# Patient Record
Sex: Female | Born: 1945 | Race: White | Hispanic: No | Smoking: Never smoker
Health system: Southern US, Community
[De-identification: ages and names within clinical notes are randomized; demographics above are authoritative.]

## PROBLEM LIST (undated history)

## (undated) DIAGNOSIS — J8410 Pulmonary fibrosis, unspecified: Secondary | ICD-10-CM

## (undated) DIAGNOSIS — I10 Essential (primary) hypertension: Secondary | ICD-10-CM

## (undated) DIAGNOSIS — F0150 Vascular dementia without behavioral disturbance: Secondary | ICD-10-CM

## (undated) DIAGNOSIS — M810 Age-related osteoporosis without current pathological fracture: Secondary | ICD-10-CM

## (undated) DIAGNOSIS — K219 Gastro-esophageal reflux disease without esophagitis: Secondary | ICD-10-CM

## (undated) DIAGNOSIS — D649 Anemia, unspecified: Secondary | ICD-10-CM

## (undated) DIAGNOSIS — J449 Chronic obstructive pulmonary disease, unspecified: Secondary | ICD-10-CM

## (undated) DIAGNOSIS — G4089 Other seizures: Secondary | ICD-10-CM

## (undated) DIAGNOSIS — L89150 Pressure ulcer of sacral region, unstageable: Secondary | ICD-10-CM

## (undated) DIAGNOSIS — E7800 Pure hypercholesterolemia, unspecified: Secondary | ICD-10-CM

## (undated) DIAGNOSIS — E039 Hypothyroidism, unspecified: Secondary | ICD-10-CM

## (undated) DIAGNOSIS — I25119 Atherosclerotic heart disease of native coronary artery with unspecified angina pectoris: Secondary | ICD-10-CM

## (undated) DIAGNOSIS — Z978 Presence of other specified devices: Secondary | ICD-10-CM

## (undated) DIAGNOSIS — R748 Abnormal levels of other serum enzymes: Secondary | ICD-10-CM

## (undated) DIAGNOSIS — F418 Other specified anxiety disorders: Secondary | ICD-10-CM

## (undated) DIAGNOSIS — G4700 Insomnia, unspecified: Secondary | ICD-10-CM

## (undated) DIAGNOSIS — M86462 Chronic osteomyelitis with draining sinus, left tibia and fibula: Secondary | ICD-10-CM

## (undated) DIAGNOSIS — E46 Unspecified protein-calorie malnutrition: Secondary | ICD-10-CM

## (undated) DIAGNOSIS — F419 Anxiety disorder, unspecified: Secondary | ICD-10-CM

## (undated) DIAGNOSIS — J309 Allergic rhinitis, unspecified: Secondary | ICD-10-CM

## (undated) DIAGNOSIS — M069 Rheumatoid arthritis, unspecified: Secondary | ICD-10-CM

## (undated) DIAGNOSIS — N319 Neuromuscular dysfunction of bladder, unspecified: Secondary | ICD-10-CM

## (undated) DIAGNOSIS — R1310 Dysphagia, unspecified: Secondary | ICD-10-CM

## (undated) DIAGNOSIS — M818 Other osteoporosis without current pathological fracture: Secondary | ICD-10-CM

## (undated) HISTORY — DX: Other specified anxiety disorders: F41.8

## (undated) HISTORY — PX: ABDOMINAL HYSTERECTOMY: SHX81

## (undated) HISTORY — PX: INCISIONAL HERNIA REPAIR: SHX193

## (undated) HISTORY — DX: Anxiety disorder, unspecified: F41.9

## (undated) HISTORY — DX: Age-related osteoporosis without current pathological fracture: M81.0

## (undated) HISTORY — DX: Presence of other specified devices: Z97.8

## (undated) HISTORY — DX: Other osteoporosis without current pathological fracture: M81.8

## (undated) HISTORY — DX: Other seizures: G40.89

## (undated) HISTORY — DX: Hypothyroidism, unspecified: E03.9

## (undated) HISTORY — DX: Insomnia, unspecified: G47.00

## (undated) HISTORY — DX: Chronic osteomyelitis with draining sinus, left tibia and fibula: M86.462

## (undated) HISTORY — DX: Atherosclerotic heart disease of native coronary artery with unspecified angina pectoris: I25.119

## (undated) HISTORY — DX: Pulmonary fibrosis, unspecified: J84.10

## (undated) HISTORY — DX: Anemia, unspecified: D64.9

## (undated) HISTORY — PX: CARPAL TUNNEL RELEASE: SHX101

## (undated) HISTORY — DX: Essential (primary) hypertension: I10

## (undated) HISTORY — DX: Pressure ulcer of sacral region, unstageable: L89.150

## (undated) HISTORY — DX: Vascular dementia, unspecified severity, without behavioral disturbance, psychotic disturbance, mood disturbance, and anxiety: F01.50

## (undated) HISTORY — PX: BELOW KNEE LEG AMPUTATION: SUR23

## (undated) HISTORY — DX: Unspecified protein-calorie malnutrition: E46

## (undated) HISTORY — DX: Abnormal levels of other serum enzymes: R74.8

## (undated) HISTORY — DX: Neuromuscular dysfunction of bladder, unspecified: N31.9

## (undated) HISTORY — DX: Gastro-esophageal reflux disease without esophagitis: K21.9

## (undated) HISTORY — DX: Pure hypercholesterolemia, unspecified: E78.00

## (undated) HISTORY — PX: OVARY SURGERY: SHX727

## (undated) HISTORY — DX: Rheumatoid arthritis, unspecified: M06.9

## (undated) HISTORY — PX: LAPAROSCOPIC CHOLECYSTECTOMY: SUR755

## (undated) HISTORY — DX: Dysphagia, unspecified: R13.10

## (undated) HISTORY — DX: Allergic rhinitis, unspecified: J30.9

## (undated) HISTORY — DX: Chronic obstructive pulmonary disease, unspecified: J44.9

---

## 2001-11-22 ENCOUNTER — Inpatient Hospital Stay (HOSPITAL_COMMUNITY): Admission: EM | Admit: 2001-11-22 | Discharge: 2001-11-25 | Payer: Self-pay | Admitting: Emergency Medicine

## 2001-11-22 ENCOUNTER — Encounter: Payer: Self-pay | Admitting: Emergency Medicine

## 2001-11-23 ENCOUNTER — Encounter: Payer: Self-pay | Admitting: Internal Medicine

## 2004-05-29 ENCOUNTER — Ambulatory Visit: Admission: RE | Admit: 2004-05-29 | Discharge: 2004-05-29 | Payer: Self-pay | Admitting: Gynecology

## 2004-06-19 ENCOUNTER — Inpatient Hospital Stay (HOSPITAL_COMMUNITY): Admission: RE | Admit: 2004-06-19 | Discharge: 2004-06-23 | Payer: Self-pay | Admitting: Obstetrics & Gynecology

## 2004-07-31 ENCOUNTER — Ambulatory Visit: Admission: RE | Admit: 2004-07-31 | Discharge: 2004-07-31 | Payer: Self-pay | Admitting: Gynecology

## 2005-08-23 ENCOUNTER — Encounter: Admission: RE | Admit: 2005-08-23 | Discharge: 2005-08-23 | Payer: Self-pay | Admitting: Internal Medicine

## 2014-06-13 DIAGNOSIS — E780 Pure hypercholesterolemia: Secondary | ICD-10-CM | POA: Diagnosis not present

## 2014-06-13 DIAGNOSIS — I10 Essential (primary) hypertension: Secondary | ICD-10-CM | POA: Diagnosis not present

## 2014-06-13 DIAGNOSIS — F411 Generalized anxiety disorder: Secondary | ICD-10-CM | POA: Diagnosis not present

## 2014-06-13 DIAGNOSIS — M069 Rheumatoid arthritis, unspecified: Secondary | ICD-10-CM | POA: Diagnosis not present

## 2014-06-13 DIAGNOSIS — M159 Polyosteoarthritis, unspecified: Secondary | ICD-10-CM | POA: Diagnosis not present

## 2014-06-27 DIAGNOSIS — F410 Panic disorder [episodic paroxysmal anxiety] without agoraphobia: Secondary | ICD-10-CM | POA: Diagnosis not present

## 2014-06-27 DIAGNOSIS — Z9114 Patient's other noncompliance with medication regimen: Secondary | ICD-10-CM | POA: Diagnosis not present

## 2014-06-27 DIAGNOSIS — F329 Major depressive disorder, single episode, unspecified: Secondary | ICD-10-CM | POA: Diagnosis not present

## 2014-06-27 DIAGNOSIS — Z7982 Long term (current) use of aspirin: Secondary | ICD-10-CM | POA: Diagnosis not present

## 2014-06-29 DIAGNOSIS — I10 Essential (primary) hypertension: Secondary | ICD-10-CM | POA: Diagnosis not present

## 2014-06-29 DIAGNOSIS — M159 Polyosteoarthritis, unspecified: Secondary | ICD-10-CM | POA: Diagnosis not present

## 2014-06-29 DIAGNOSIS — E039 Hypothyroidism, unspecified: Secondary | ICD-10-CM | POA: Diagnosis not present

## 2014-06-29 DIAGNOSIS — G4700 Insomnia, unspecified: Secondary | ICD-10-CM | POA: Diagnosis not present

## 2014-06-29 DIAGNOSIS — E780 Pure hypercholesterolemia: Secondary | ICD-10-CM | POA: Diagnosis not present

## 2014-06-29 DIAGNOSIS — F411 Generalized anxiety disorder: Secondary | ICD-10-CM | POA: Diagnosis not present

## 2014-07-08 DIAGNOSIS — J449 Chronic obstructive pulmonary disease, unspecified: Secondary | ICD-10-CM | POA: Diagnosis not present

## 2014-07-11 DIAGNOSIS — E780 Pure hypercholesterolemia: Secondary | ICD-10-CM | POA: Diagnosis not present

## 2014-07-11 DIAGNOSIS — M159 Polyosteoarthritis, unspecified: Secondary | ICD-10-CM | POA: Diagnosis not present

## 2014-07-11 DIAGNOSIS — G4700 Insomnia, unspecified: Secondary | ICD-10-CM | POA: Diagnosis not present

## 2014-07-11 DIAGNOSIS — I10 Essential (primary) hypertension: Secondary | ICD-10-CM | POA: Diagnosis not present

## 2014-07-11 DIAGNOSIS — R110 Nausea: Secondary | ICD-10-CM | POA: Diagnosis not present

## 2014-07-29 DIAGNOSIS — Z79899 Other long term (current) drug therapy: Secondary | ICD-10-CM | POA: Diagnosis not present

## 2014-07-29 DIAGNOSIS — M0579 Rheumatoid arthritis with rheumatoid factor of multiple sites without organ or systems involvement: Secondary | ICD-10-CM | POA: Diagnosis not present

## 2014-07-29 DIAGNOSIS — M797 Fibromyalgia: Secondary | ICD-10-CM | POA: Diagnosis not present

## 2014-07-30 DIAGNOSIS — E039 Hypothyroidism, unspecified: Secondary | ICD-10-CM | POA: Diagnosis not present

## 2014-08-05 DIAGNOSIS — M159 Polyosteoarthritis, unspecified: Secondary | ICD-10-CM | POA: Diagnosis not present

## 2014-08-05 DIAGNOSIS — G4700 Insomnia, unspecified: Secondary | ICD-10-CM | POA: Diagnosis not present

## 2014-08-05 DIAGNOSIS — K219 Gastro-esophageal reflux disease without esophagitis: Secondary | ICD-10-CM | POA: Diagnosis not present

## 2014-08-05 DIAGNOSIS — I10 Essential (primary) hypertension: Secondary | ICD-10-CM | POA: Diagnosis not present

## 2014-08-05 DIAGNOSIS — E780 Pure hypercholesterolemia: Secondary | ICD-10-CM | POA: Diagnosis not present

## 2014-08-08 DIAGNOSIS — J449 Chronic obstructive pulmonary disease, unspecified: Secondary | ICD-10-CM | POA: Diagnosis not present

## 2014-08-28 DIAGNOSIS — E039 Hypothyroidism, unspecified: Secondary | ICD-10-CM | POA: Diagnosis not present

## 2014-09-05 DIAGNOSIS — E780 Pure hypercholesterolemia: Secondary | ICD-10-CM | POA: Diagnosis not present

## 2014-09-05 DIAGNOSIS — G4700 Insomnia, unspecified: Secondary | ICD-10-CM | POA: Diagnosis not present

## 2014-09-05 DIAGNOSIS — M159 Polyosteoarthritis, unspecified: Secondary | ICD-10-CM | POA: Diagnosis not present

## 2014-09-05 DIAGNOSIS — K219 Gastro-esophageal reflux disease without esophagitis: Secondary | ICD-10-CM | POA: Diagnosis not present

## 2014-09-05 DIAGNOSIS — I10 Essential (primary) hypertension: Secondary | ICD-10-CM | POA: Diagnosis not present

## 2014-09-06 DIAGNOSIS — J449 Chronic obstructive pulmonary disease, unspecified: Secondary | ICD-10-CM | POA: Diagnosis not present

## 2014-09-28 DIAGNOSIS — E039 Hypothyroidism, unspecified: Secondary | ICD-10-CM | POA: Diagnosis not present

## 2014-10-03 DIAGNOSIS — G4700 Insomnia, unspecified: Secondary | ICD-10-CM | POA: Diagnosis not present

## 2014-10-03 DIAGNOSIS — Z79899 Other long term (current) drug therapy: Secondary | ICD-10-CM | POA: Diagnosis not present

## 2014-10-03 DIAGNOSIS — Z9181 History of falling: Secondary | ICD-10-CM | POA: Diagnosis not present

## 2014-10-03 DIAGNOSIS — Z1389 Encounter for screening for other disorder: Secondary | ICD-10-CM | POA: Diagnosis not present

## 2014-10-03 DIAGNOSIS — I10 Essential (primary) hypertension: Secondary | ICD-10-CM | POA: Diagnosis not present

## 2014-10-03 DIAGNOSIS — M159 Polyosteoarthritis, unspecified: Secondary | ICD-10-CM | POA: Diagnosis not present

## 2014-10-03 DIAGNOSIS — E780 Pure hypercholesterolemia: Secondary | ICD-10-CM | POA: Diagnosis not present

## 2014-10-07 DIAGNOSIS — J449 Chronic obstructive pulmonary disease, unspecified: Secondary | ICD-10-CM | POA: Diagnosis not present

## 2014-10-26 DIAGNOSIS — K760 Fatty (change of) liver, not elsewhere classified: Secondary | ICD-10-CM | POA: Diagnosis not present

## 2014-10-28 DIAGNOSIS — E039 Hypothyroidism, unspecified: Secondary | ICD-10-CM | POA: Diagnosis not present

## 2014-10-31 DIAGNOSIS — K219 Gastro-esophageal reflux disease without esophagitis: Secondary | ICD-10-CM | POA: Diagnosis not present

## 2014-10-31 DIAGNOSIS — E780 Pure hypercholesterolemia: Secondary | ICD-10-CM | POA: Diagnosis not present

## 2014-10-31 DIAGNOSIS — R609 Edema, unspecified: Secondary | ICD-10-CM | POA: Diagnosis not present

## 2014-10-31 DIAGNOSIS — I10 Essential (primary) hypertension: Secondary | ICD-10-CM | POA: Diagnosis not present

## 2014-10-31 DIAGNOSIS — M159 Polyosteoarthritis, unspecified: Secondary | ICD-10-CM | POA: Diagnosis not present

## 2014-11-04 DIAGNOSIS — M069 Rheumatoid arthritis, unspecified: Secondary | ICD-10-CM | POA: Diagnosis not present

## 2014-11-04 DIAGNOSIS — Z79899 Other long term (current) drug therapy: Secondary | ICD-10-CM | POA: Diagnosis not present

## 2014-11-04 DIAGNOSIS — R748 Abnormal levels of other serum enzymes: Secondary | ICD-10-CM | POA: Diagnosis not present

## 2014-11-06 DIAGNOSIS — J449 Chronic obstructive pulmonary disease, unspecified: Secondary | ICD-10-CM | POA: Diagnosis not present

## 2014-11-28 DIAGNOSIS — R609 Edema, unspecified: Secondary | ICD-10-CM | POA: Diagnosis not present

## 2014-11-28 DIAGNOSIS — E780 Pure hypercholesterolemia: Secondary | ICD-10-CM | POA: Diagnosis not present

## 2014-11-28 DIAGNOSIS — E039 Hypothyroidism, unspecified: Secondary | ICD-10-CM | POA: Diagnosis not present

## 2014-11-28 DIAGNOSIS — K219 Gastro-esophageal reflux disease without esophagitis: Secondary | ICD-10-CM | POA: Diagnosis not present

## 2014-11-28 DIAGNOSIS — M159 Polyosteoarthritis, unspecified: Secondary | ICD-10-CM | POA: Diagnosis not present

## 2014-11-28 DIAGNOSIS — I10 Essential (primary) hypertension: Secondary | ICD-10-CM | POA: Diagnosis not present

## 2014-12-07 DIAGNOSIS — J449 Chronic obstructive pulmonary disease, unspecified: Secondary | ICD-10-CM | POA: Diagnosis not present

## 2014-12-26 DIAGNOSIS — I2510 Atherosclerotic heart disease of native coronary artery without angina pectoris: Secondary | ICD-10-CM | POA: Diagnosis not present

## 2014-12-26 DIAGNOSIS — R42 Dizziness and giddiness: Secondary | ICD-10-CM | POA: Diagnosis not present

## 2014-12-26 DIAGNOSIS — M159 Polyosteoarthritis, unspecified: Secondary | ICD-10-CM | POA: Diagnosis not present

## 2014-12-26 DIAGNOSIS — E780 Pure hypercholesterolemia: Secondary | ICD-10-CM | POA: Diagnosis not present

## 2014-12-26 DIAGNOSIS — I10 Essential (primary) hypertension: Secondary | ICD-10-CM | POA: Diagnosis not present

## 2014-12-28 DIAGNOSIS — E039 Hypothyroidism, unspecified: Secondary | ICD-10-CM | POA: Diagnosis not present

## 2015-01-06 DIAGNOSIS — J449 Chronic obstructive pulmonary disease, unspecified: Secondary | ICD-10-CM | POA: Diagnosis not present

## 2015-01-09 DIAGNOSIS — Z89522 Acquired absence of left knee: Secondary | ICD-10-CM | POA: Diagnosis not present

## 2015-01-09 DIAGNOSIS — I2510 Atherosclerotic heart disease of native coronary artery without angina pectoris: Secondary | ICD-10-CM | POA: Diagnosis not present

## 2015-01-09 DIAGNOSIS — I10 Essential (primary) hypertension: Secondary | ICD-10-CM | POA: Diagnosis not present

## 2015-01-09 DIAGNOSIS — E780 Pure hypercholesterolemia: Secondary | ICD-10-CM | POA: Diagnosis not present

## 2015-01-09 DIAGNOSIS — M159 Polyosteoarthritis, unspecified: Secondary | ICD-10-CM | POA: Diagnosis not present

## 2015-01-10 DIAGNOSIS — J209 Acute bronchitis, unspecified: Secondary | ICD-10-CM | POA: Diagnosis not present

## 2015-01-10 DIAGNOSIS — I10 Essential (primary) hypertension: Secondary | ICD-10-CM | POA: Diagnosis not present

## 2015-01-10 DIAGNOSIS — E780 Pure hypercholesterolemia: Secondary | ICD-10-CM | POA: Diagnosis not present

## 2015-01-10 DIAGNOSIS — M159 Polyosteoarthritis, unspecified: Secondary | ICD-10-CM | POA: Diagnosis not present

## 2015-01-10 DIAGNOSIS — I2510 Atherosclerotic heart disease of native coronary artery without angina pectoris: Secondary | ICD-10-CM | POA: Diagnosis not present

## 2015-01-20 DIAGNOSIS — R51 Headache: Secondary | ICD-10-CM | POA: Diagnosis not present

## 2015-01-20 DIAGNOSIS — R42 Dizziness and giddiness: Secondary | ICD-10-CM | POA: Diagnosis not present

## 2015-01-23 DIAGNOSIS — E780 Pure hypercholesterolemia: Secondary | ICD-10-CM | POA: Diagnosis not present

## 2015-01-23 DIAGNOSIS — I10 Essential (primary) hypertension: Secondary | ICD-10-CM | POA: Diagnosis not present

## 2015-01-23 DIAGNOSIS — I2510 Atherosclerotic heart disease of native coronary artery without angina pectoris: Secondary | ICD-10-CM | POA: Diagnosis not present

## 2015-01-23 DIAGNOSIS — M159 Polyosteoarthritis, unspecified: Secondary | ICD-10-CM | POA: Diagnosis not present

## 2015-01-23 DIAGNOSIS — K219 Gastro-esophageal reflux disease without esophagitis: Secondary | ICD-10-CM | POA: Diagnosis not present

## 2015-01-28 DIAGNOSIS — E039 Hypothyroidism, unspecified: Secondary | ICD-10-CM | POA: Diagnosis not present

## 2015-01-28 DIAGNOSIS — R51 Headache: Secondary | ICD-10-CM | POA: Diagnosis not present

## 2015-01-28 DIAGNOSIS — R42 Dizziness and giddiness: Secondary | ICD-10-CM | POA: Diagnosis not present

## 2015-02-06 DIAGNOSIS — R51 Headache: Secondary | ICD-10-CM | POA: Diagnosis not present

## 2015-02-06 DIAGNOSIS — J449 Chronic obstructive pulmonary disease, unspecified: Secondary | ICD-10-CM | POA: Diagnosis not present

## 2015-02-06 DIAGNOSIS — R42 Dizziness and giddiness: Secondary | ICD-10-CM | POA: Diagnosis not present

## 2015-02-06 DIAGNOSIS — I639 Cerebral infarction, unspecified: Secondary | ICD-10-CM | POA: Diagnosis not present

## 2015-02-20 DIAGNOSIS — M159 Polyosteoarthritis, unspecified: Secondary | ICD-10-CM | POA: Diagnosis not present

## 2015-02-20 DIAGNOSIS — I2510 Atherosclerotic heart disease of native coronary artery without angina pectoris: Secondary | ICD-10-CM | POA: Diagnosis not present

## 2015-02-20 DIAGNOSIS — M069 Rheumatoid arthritis, unspecified: Secondary | ICD-10-CM | POA: Diagnosis not present

## 2015-02-20 DIAGNOSIS — Z89522 Acquired absence of left knee: Secondary | ICD-10-CM | POA: Diagnosis not present

## 2015-02-20 DIAGNOSIS — J449 Chronic obstructive pulmonary disease, unspecified: Secondary | ICD-10-CM | POA: Diagnosis not present

## 2015-02-23 DIAGNOSIS — R1310 Dysphagia, unspecified: Secondary | ICD-10-CM | POA: Diagnosis not present

## 2015-02-23 DIAGNOSIS — M069 Rheumatoid arthritis, unspecified: Secondary | ICD-10-CM | POA: Diagnosis not present

## 2015-02-23 DIAGNOSIS — I2510 Atherosclerotic heart disease of native coronary artery without angina pectoris: Secondary | ICD-10-CM | POA: Diagnosis not present

## 2015-02-23 DIAGNOSIS — J449 Chronic obstructive pulmonary disease, unspecified: Secondary | ICD-10-CM | POA: Diagnosis not present

## 2015-02-23 DIAGNOSIS — M159 Polyosteoarthritis, unspecified: Secondary | ICD-10-CM | POA: Diagnosis not present

## 2015-02-28 DIAGNOSIS — E039 Hypothyroidism, unspecified: Secondary | ICD-10-CM | POA: Diagnosis not present

## 2015-03-09 DIAGNOSIS — J449 Chronic obstructive pulmonary disease, unspecified: Secondary | ICD-10-CM | POA: Diagnosis not present

## 2015-03-21 DIAGNOSIS — I2510 Atherosclerotic heart disease of native coronary artery without angina pectoris: Secondary | ICD-10-CM | POA: Diagnosis not present

## 2015-03-21 DIAGNOSIS — M069 Rheumatoid arthritis, unspecified: Secondary | ICD-10-CM | POA: Diagnosis not present

## 2015-03-21 DIAGNOSIS — M159 Polyosteoarthritis, unspecified: Secondary | ICD-10-CM | POA: Diagnosis not present

## 2015-03-21 DIAGNOSIS — Z23 Encounter for immunization: Secondary | ICD-10-CM | POA: Diagnosis not present

## 2015-03-21 DIAGNOSIS — J449 Chronic obstructive pulmonary disease, unspecified: Secondary | ICD-10-CM | POA: Diagnosis not present

## 2015-03-30 DIAGNOSIS — E039 Hypothyroidism, unspecified: Secondary | ICD-10-CM | POA: Diagnosis not present

## 2015-04-04 DIAGNOSIS — M159 Polyosteoarthritis, unspecified: Secondary | ICD-10-CM | POA: Diagnosis not present

## 2015-04-04 DIAGNOSIS — Z89512 Acquired absence of left leg below knee: Secondary | ICD-10-CM | POA: Diagnosis not present

## 2015-04-04 DIAGNOSIS — J449 Chronic obstructive pulmonary disease, unspecified: Secondary | ICD-10-CM | POA: Diagnosis not present

## 2015-04-04 DIAGNOSIS — I2510 Atherosclerotic heart disease of native coronary artery without angina pectoris: Secondary | ICD-10-CM | POA: Diagnosis not present

## 2015-04-08 DIAGNOSIS — J449 Chronic obstructive pulmonary disease, unspecified: Secondary | ICD-10-CM | POA: Diagnosis not present

## 2015-04-18 DIAGNOSIS — J449 Chronic obstructive pulmonary disease, unspecified: Secondary | ICD-10-CM | POA: Diagnosis not present

## 2015-04-18 DIAGNOSIS — M159 Polyosteoarthritis, unspecified: Secondary | ICD-10-CM | POA: Diagnosis not present

## 2015-04-18 DIAGNOSIS — R1310 Dysphagia, unspecified: Secondary | ICD-10-CM | POA: Diagnosis not present

## 2015-04-18 DIAGNOSIS — M069 Rheumatoid arthritis, unspecified: Secondary | ICD-10-CM | POA: Diagnosis not present

## 2015-04-18 DIAGNOSIS — I2510 Atherosclerotic heart disease of native coronary artery without angina pectoris: Secondary | ICD-10-CM | POA: Diagnosis not present

## 2015-04-30 DIAGNOSIS — E039 Hypothyroidism, unspecified: Secondary | ICD-10-CM | POA: Diagnosis not present

## 2015-05-05 DIAGNOSIS — I2510 Atherosclerotic heart disease of native coronary artery without angina pectoris: Secondary | ICD-10-CM | POA: Diagnosis not present

## 2015-05-05 DIAGNOSIS — J449 Chronic obstructive pulmonary disease, unspecified: Secondary | ICD-10-CM | POA: Diagnosis not present

## 2015-05-05 DIAGNOSIS — M159 Polyosteoarthritis, unspecified: Secondary | ICD-10-CM | POA: Diagnosis not present

## 2015-05-05 DIAGNOSIS — Z89512 Acquired absence of left leg below knee: Secondary | ICD-10-CM | POA: Diagnosis not present

## 2015-05-09 DIAGNOSIS — J449 Chronic obstructive pulmonary disease, unspecified: Secondary | ICD-10-CM | POA: Diagnosis not present

## 2015-05-22 DIAGNOSIS — R1310 Dysphagia, unspecified: Secondary | ICD-10-CM | POA: Diagnosis not present

## 2015-05-22 DIAGNOSIS — I10 Essential (primary) hypertension: Secondary | ICD-10-CM | POA: Diagnosis not present

## 2015-05-22 DIAGNOSIS — M069 Rheumatoid arthritis, unspecified: Secondary | ICD-10-CM | POA: Diagnosis not present

## 2015-05-22 DIAGNOSIS — E7800 Pure hypercholesterolemia, unspecified: Secondary | ICD-10-CM | POA: Diagnosis not present

## 2015-05-22 DIAGNOSIS — I2510 Atherosclerotic heart disease of native coronary artery without angina pectoris: Secondary | ICD-10-CM | POA: Diagnosis not present

## 2015-05-22 DIAGNOSIS — J449 Chronic obstructive pulmonary disease, unspecified: Secondary | ICD-10-CM | POA: Diagnosis not present

## 2015-05-22 DIAGNOSIS — D649 Anemia, unspecified: Secondary | ICD-10-CM | POA: Diagnosis not present

## 2015-05-30 DIAGNOSIS — E039 Hypothyroidism, unspecified: Secondary | ICD-10-CM | POA: Diagnosis not present

## 2015-06-04 DIAGNOSIS — Z89512 Acquired absence of left leg below knee: Secondary | ICD-10-CM | POA: Diagnosis not present

## 2015-06-04 DIAGNOSIS — J449 Chronic obstructive pulmonary disease, unspecified: Secondary | ICD-10-CM | POA: Diagnosis not present

## 2015-06-04 DIAGNOSIS — M159 Polyosteoarthritis, unspecified: Secondary | ICD-10-CM | POA: Diagnosis not present

## 2015-06-04 DIAGNOSIS — I2510 Atherosclerotic heart disease of native coronary artery without angina pectoris: Secondary | ICD-10-CM | POA: Diagnosis not present

## 2015-06-08 DIAGNOSIS — J449 Chronic obstructive pulmonary disease, unspecified: Secondary | ICD-10-CM | POA: Diagnosis not present

## 2015-06-23 DIAGNOSIS — M069 Rheumatoid arthritis, unspecified: Secondary | ICD-10-CM | POA: Diagnosis not present

## 2015-06-23 DIAGNOSIS — Z1389 Encounter for screening for other disorder: Secondary | ICD-10-CM | POA: Diagnosis not present

## 2015-06-23 DIAGNOSIS — I2510 Atherosclerotic heart disease of native coronary artery without angina pectoris: Secondary | ICD-10-CM | POA: Diagnosis not present

## 2015-06-23 DIAGNOSIS — J209 Acute bronchitis, unspecified: Secondary | ICD-10-CM | POA: Diagnosis not present

## 2015-06-23 DIAGNOSIS — J449 Chronic obstructive pulmonary disease, unspecified: Secondary | ICD-10-CM | POA: Diagnosis not present

## 2015-06-30 DIAGNOSIS — E039 Hypothyroidism, unspecified: Secondary | ICD-10-CM | POA: Diagnosis not present

## 2015-07-04 DIAGNOSIS — R1310 Dysphagia, unspecified: Secondary | ICD-10-CM | POA: Diagnosis not present

## 2015-07-04 DIAGNOSIS — I2510 Atherosclerotic heart disease of native coronary artery without angina pectoris: Secondary | ICD-10-CM | POA: Diagnosis not present

## 2015-07-04 DIAGNOSIS — J101 Influenza due to other identified influenza virus with other respiratory manifestations: Secondary | ICD-10-CM | POA: Diagnosis not present

## 2015-07-04 DIAGNOSIS — M069 Rheumatoid arthritis, unspecified: Secondary | ICD-10-CM | POA: Diagnosis not present

## 2015-07-04 DIAGNOSIS — J449 Chronic obstructive pulmonary disease, unspecified: Secondary | ICD-10-CM | POA: Diagnosis not present

## 2015-07-05 DIAGNOSIS — J449 Chronic obstructive pulmonary disease, unspecified: Secondary | ICD-10-CM | POA: Diagnosis not present

## 2015-07-05 DIAGNOSIS — I2510 Atherosclerotic heart disease of native coronary artery without angina pectoris: Secondary | ICD-10-CM | POA: Diagnosis not present

## 2015-07-05 DIAGNOSIS — M159 Polyosteoarthritis, unspecified: Secondary | ICD-10-CM | POA: Diagnosis not present

## 2015-07-05 DIAGNOSIS — Z89512 Acquired absence of left leg below knee: Secondary | ICD-10-CM | POA: Diagnosis not present

## 2015-07-09 DIAGNOSIS — J449 Chronic obstructive pulmonary disease, unspecified: Secondary | ICD-10-CM | POA: Diagnosis not present

## 2015-07-17 DIAGNOSIS — E039 Hypothyroidism, unspecified: Secondary | ICD-10-CM | POA: Diagnosis not present

## 2015-07-17 DIAGNOSIS — Z89522 Acquired absence of left knee: Secondary | ICD-10-CM | POA: Diagnosis not present

## 2015-07-17 DIAGNOSIS — J309 Allergic rhinitis, unspecified: Secondary | ICD-10-CM | POA: Diagnosis not present

## 2015-07-17 DIAGNOSIS — M159 Polyosteoarthritis, unspecified: Secondary | ICD-10-CM | POA: Diagnosis not present

## 2015-07-31 DIAGNOSIS — E039 Hypothyroidism, unspecified: Secondary | ICD-10-CM | POA: Diagnosis not present

## 2015-08-02 DIAGNOSIS — R0602 Shortness of breath: Secondary | ICD-10-CM | POA: Diagnosis not present

## 2015-08-02 DIAGNOSIS — J441 Chronic obstructive pulmonary disease with (acute) exacerbation: Secondary | ICD-10-CM | POA: Diagnosis not present

## 2015-08-02 DIAGNOSIS — J209 Acute bronchitis, unspecified: Secondary | ICD-10-CM | POA: Diagnosis not present

## 2015-08-02 DIAGNOSIS — R05 Cough: Secondary | ICD-10-CM | POA: Diagnosis not present

## 2015-08-02 DIAGNOSIS — J440 Chronic obstructive pulmonary disease with acute lower respiratory infection: Secondary | ICD-10-CM | POA: Diagnosis not present

## 2015-08-05 DIAGNOSIS — J449 Chronic obstructive pulmonary disease, unspecified: Secondary | ICD-10-CM | POA: Diagnosis not present

## 2015-08-05 DIAGNOSIS — Z89512 Acquired absence of left leg below knee: Secondary | ICD-10-CM | POA: Diagnosis not present

## 2015-08-05 DIAGNOSIS — I2510 Atherosclerotic heart disease of native coronary artery without angina pectoris: Secondary | ICD-10-CM | POA: Diagnosis not present

## 2015-08-05 DIAGNOSIS — M159 Polyosteoarthritis, unspecified: Secondary | ICD-10-CM | POA: Diagnosis not present

## 2015-08-08 DIAGNOSIS — J449 Chronic obstructive pulmonary disease, unspecified: Secondary | ICD-10-CM | POA: Diagnosis not present

## 2015-08-09 DIAGNOSIS — J449 Chronic obstructive pulmonary disease, unspecified: Secondary | ICD-10-CM | POA: Diagnosis not present

## 2015-08-14 DIAGNOSIS — J309 Allergic rhinitis, unspecified: Secondary | ICD-10-CM | POA: Diagnosis not present

## 2015-08-14 DIAGNOSIS — J209 Acute bronchitis, unspecified: Secondary | ICD-10-CM | POA: Diagnosis not present

## 2015-08-14 DIAGNOSIS — M159 Polyosteoarthritis, unspecified: Secondary | ICD-10-CM | POA: Diagnosis not present

## 2015-08-14 DIAGNOSIS — E7800 Pure hypercholesterolemia, unspecified: Secondary | ICD-10-CM | POA: Diagnosis not present

## 2015-08-14 DIAGNOSIS — I10 Essential (primary) hypertension: Secondary | ICD-10-CM | POA: Diagnosis not present

## 2015-08-26 DIAGNOSIS — B370 Candidal stomatitis: Secondary | ICD-10-CM | POA: Diagnosis not present

## 2015-08-26 DIAGNOSIS — J449 Chronic obstructive pulmonary disease, unspecified: Secondary | ICD-10-CM | POA: Diagnosis not present

## 2015-08-28 DIAGNOSIS — E039 Hypothyroidism, unspecified: Secondary | ICD-10-CM | POA: Diagnosis not present

## 2015-09-02 DIAGNOSIS — I2510 Atherosclerotic heart disease of native coronary artery without angina pectoris: Secondary | ICD-10-CM | POA: Diagnosis not present

## 2015-09-02 DIAGNOSIS — J449 Chronic obstructive pulmonary disease, unspecified: Secondary | ICD-10-CM | POA: Diagnosis not present

## 2015-09-02 DIAGNOSIS — M159 Polyosteoarthritis, unspecified: Secondary | ICD-10-CM | POA: Diagnosis not present

## 2015-09-02 DIAGNOSIS — Z89512 Acquired absence of left leg below knee: Secondary | ICD-10-CM | POA: Diagnosis not present

## 2015-09-06 DIAGNOSIS — J449 Chronic obstructive pulmonary disease, unspecified: Secondary | ICD-10-CM | POA: Diagnosis not present

## 2015-09-11 DIAGNOSIS — M159 Polyosteoarthritis, unspecified: Secondary | ICD-10-CM | POA: Diagnosis not present

## 2015-09-11 DIAGNOSIS — E039 Hypothyroidism, unspecified: Secondary | ICD-10-CM | POA: Diagnosis not present

## 2015-09-11 DIAGNOSIS — J309 Allergic rhinitis, unspecified: Secondary | ICD-10-CM | POA: Diagnosis not present

## 2015-09-11 DIAGNOSIS — Z89522 Acquired absence of left knee: Secondary | ICD-10-CM | POA: Diagnosis not present

## 2015-09-11 DIAGNOSIS — E7800 Pure hypercholesterolemia, unspecified: Secondary | ICD-10-CM | POA: Diagnosis not present

## 2015-09-11 DIAGNOSIS — I10 Essential (primary) hypertension: Secondary | ICD-10-CM | POA: Diagnosis not present

## 2015-09-28 DIAGNOSIS — E039 Hypothyroidism, unspecified: Secondary | ICD-10-CM | POA: Diagnosis not present

## 2015-10-03 DIAGNOSIS — Z89512 Acquired absence of left leg below knee: Secondary | ICD-10-CM | POA: Diagnosis not present

## 2015-10-03 DIAGNOSIS — M159 Polyosteoarthritis, unspecified: Secondary | ICD-10-CM | POA: Diagnosis not present

## 2015-10-03 DIAGNOSIS — I2510 Atherosclerotic heart disease of native coronary artery without angina pectoris: Secondary | ICD-10-CM | POA: Diagnosis not present

## 2015-10-03 DIAGNOSIS — J449 Chronic obstructive pulmonary disease, unspecified: Secondary | ICD-10-CM | POA: Diagnosis not present

## 2015-10-07 DIAGNOSIS — J449 Chronic obstructive pulmonary disease, unspecified: Secondary | ICD-10-CM | POA: Diagnosis not present

## 2015-10-16 DIAGNOSIS — J309 Allergic rhinitis, unspecified: Secondary | ICD-10-CM | POA: Diagnosis not present

## 2015-10-16 DIAGNOSIS — M159 Polyosteoarthritis, unspecified: Secondary | ICD-10-CM | POA: Diagnosis not present

## 2015-10-16 DIAGNOSIS — E039 Hypothyroidism, unspecified: Secondary | ICD-10-CM | POA: Diagnosis not present

## 2015-10-16 DIAGNOSIS — Z89522 Acquired absence of left knee: Secondary | ICD-10-CM | POA: Diagnosis not present

## 2015-10-28 DIAGNOSIS — E039 Hypothyroidism, unspecified: Secondary | ICD-10-CM | POA: Diagnosis not present

## 2015-10-31 DIAGNOSIS — K210 Gastro-esophageal reflux disease with esophagitis: Secondary | ICD-10-CM | POA: Diagnosis not present

## 2015-10-31 DIAGNOSIS — R1314 Dysphagia, pharyngoesophageal phase: Secondary | ICD-10-CM | POA: Diagnosis not present

## 2015-11-02 DIAGNOSIS — I2510 Atherosclerotic heart disease of native coronary artery without angina pectoris: Secondary | ICD-10-CM | POA: Diagnosis not present

## 2015-11-02 DIAGNOSIS — Z89512 Acquired absence of left leg below knee: Secondary | ICD-10-CM | POA: Diagnosis not present

## 2015-11-02 DIAGNOSIS — M159 Polyosteoarthritis, unspecified: Secondary | ICD-10-CM | POA: Diagnosis not present

## 2015-11-02 DIAGNOSIS — J449 Chronic obstructive pulmonary disease, unspecified: Secondary | ICD-10-CM | POA: Diagnosis not present

## 2015-11-06 DIAGNOSIS — K210 Gastro-esophageal reflux disease with esophagitis: Secondary | ICD-10-CM | POA: Diagnosis not present

## 2015-11-06 DIAGNOSIS — R1314 Dysphagia, pharyngoesophageal phase: Secondary | ICD-10-CM | POA: Diagnosis not present

## 2015-11-06 DIAGNOSIS — M069 Rheumatoid arthritis, unspecified: Secondary | ICD-10-CM | POA: Diagnosis not present

## 2015-11-06 DIAGNOSIS — Z8673 Personal history of transient ischemic attack (TIA), and cerebral infarction without residual deficits: Secondary | ICD-10-CM | POA: Diagnosis not present

## 2015-11-06 DIAGNOSIS — Z87891 Personal history of nicotine dependence: Secondary | ICD-10-CM | POA: Diagnosis not present

## 2015-11-06 DIAGNOSIS — K225 Diverticulum of esophagus, acquired: Secondary | ICD-10-CM | POA: Diagnosis not present

## 2015-11-06 DIAGNOSIS — J449 Chronic obstructive pulmonary disease, unspecified: Secondary | ICD-10-CM | POA: Diagnosis not present

## 2015-11-06 DIAGNOSIS — I2510 Atherosclerotic heart disease of native coronary artery without angina pectoris: Secondary | ICD-10-CM | POA: Diagnosis not present

## 2015-11-06 DIAGNOSIS — Z9049 Acquired absence of other specified parts of digestive tract: Secondary | ICD-10-CM | POA: Diagnosis not present

## 2015-11-06 DIAGNOSIS — M810 Age-related osteoporosis without current pathological fracture: Secondary | ICD-10-CM | POA: Diagnosis not present

## 2015-11-06 DIAGNOSIS — E039 Hypothyroidism, unspecified: Secondary | ICD-10-CM | POA: Diagnosis not present

## 2015-11-06 DIAGNOSIS — I252 Old myocardial infarction: Secondary | ICD-10-CM | POA: Diagnosis not present

## 2015-11-06 DIAGNOSIS — K228 Other specified diseases of esophagus: Secondary | ICD-10-CM | POA: Diagnosis not present

## 2015-11-06 DIAGNOSIS — E7800 Pure hypercholesterolemia, unspecified: Secondary | ICD-10-CM | POA: Diagnosis not present

## 2015-11-06 DIAGNOSIS — Q393 Congenital stenosis and stricture of esophagus: Secondary | ICD-10-CM | POA: Diagnosis not present

## 2015-11-06 DIAGNOSIS — Z79899 Other long term (current) drug therapy: Secondary | ICD-10-CM | POA: Diagnosis not present

## 2015-11-06 DIAGNOSIS — K2970 Gastritis, unspecified, without bleeding: Secondary | ICD-10-CM | POA: Diagnosis not present

## 2015-11-06 DIAGNOSIS — K449 Diaphragmatic hernia without obstruction or gangrene: Secondary | ICD-10-CM | POA: Diagnosis not present

## 2015-11-06 DIAGNOSIS — Z955 Presence of coronary angioplasty implant and graft: Secondary | ICD-10-CM | POA: Diagnosis not present

## 2015-11-06 DIAGNOSIS — K222 Esophageal obstruction: Secondary | ICD-10-CM | POA: Diagnosis not present

## 2015-11-06 DIAGNOSIS — Z9981 Dependence on supplemental oxygen: Secondary | ICD-10-CM | POA: Diagnosis not present

## 2015-11-06 DIAGNOSIS — I10 Essential (primary) hypertension: Secondary | ICD-10-CM | POA: Diagnosis not present

## 2015-11-06 DIAGNOSIS — K219 Gastro-esophageal reflux disease without esophagitis: Secondary | ICD-10-CM | POA: Diagnosis not present

## 2015-11-08 DIAGNOSIS — I252 Old myocardial infarction: Secondary | ICD-10-CM | POA: Diagnosis not present

## 2015-11-08 DIAGNOSIS — I10 Essential (primary) hypertension: Secondary | ICD-10-CM | POA: Diagnosis not present

## 2015-11-08 DIAGNOSIS — R0602 Shortness of breath: Secondary | ICD-10-CM | POA: Diagnosis not present

## 2015-11-08 DIAGNOSIS — M069 Rheumatoid arthritis, unspecified: Secondary | ICD-10-CM | POA: Diagnosis not present

## 2015-11-08 DIAGNOSIS — Z955 Presence of coronary angioplasty implant and graft: Secondary | ICD-10-CM | POA: Diagnosis not present

## 2015-11-08 DIAGNOSIS — E860 Dehydration: Secondary | ICD-10-CM | POA: Diagnosis not present

## 2015-11-08 DIAGNOSIS — I249 Acute ischemic heart disease, unspecified: Secondary | ICD-10-CM | POA: Diagnosis not present

## 2015-11-08 DIAGNOSIS — E039 Hypothyroidism, unspecified: Secondary | ICD-10-CM | POA: Diagnosis not present

## 2015-11-08 DIAGNOSIS — K219 Gastro-esophageal reflux disease without esophagitis: Secondary | ICD-10-CM | POA: Diagnosis not present

## 2015-11-08 DIAGNOSIS — Z7982 Long term (current) use of aspirin: Secondary | ICD-10-CM | POA: Diagnosis not present

## 2015-11-08 DIAGNOSIS — Z79899 Other long term (current) drug therapy: Secondary | ICD-10-CM | POA: Diagnosis not present

## 2015-11-08 DIAGNOSIS — Z91041 Radiographic dye allergy status: Secondary | ICD-10-CM | POA: Diagnosis not present

## 2015-11-08 DIAGNOSIS — Z7952 Long term (current) use of systemic steroids: Secondary | ICD-10-CM | POA: Diagnosis not present

## 2015-11-08 DIAGNOSIS — J449 Chronic obstructive pulmonary disease, unspecified: Secondary | ICD-10-CM | POA: Diagnosis not present

## 2015-11-08 DIAGNOSIS — D696 Thrombocytopenia, unspecified: Secondary | ICD-10-CM | POA: Diagnosis not present

## 2015-11-08 DIAGNOSIS — I2510 Atherosclerotic heart disease of native coronary artery without angina pectoris: Secondary | ICD-10-CM | POA: Diagnosis not present

## 2015-11-08 DIAGNOSIS — Z9981 Dependence on supplemental oxygen: Secondary | ICD-10-CM | POA: Diagnosis not present

## 2015-11-08 DIAGNOSIS — K222 Esophageal obstruction: Secondary | ICD-10-CM | POA: Diagnosis not present

## 2015-11-08 DIAGNOSIS — I200 Unstable angina: Secondary | ICD-10-CM | POA: Diagnosis not present

## 2015-11-08 DIAGNOSIS — R079 Chest pain, unspecified: Secondary | ICD-10-CM | POA: Diagnosis not present

## 2015-11-09 DIAGNOSIS — Z955 Presence of coronary angioplasty implant and graft: Secondary | ICD-10-CM | POA: Diagnosis not present

## 2015-11-09 DIAGNOSIS — I252 Old myocardial infarction: Secondary | ICD-10-CM | POA: Diagnosis not present

## 2015-11-09 DIAGNOSIS — I2510 Atherosclerotic heart disease of native coronary artery without angina pectoris: Secondary | ICD-10-CM | POA: Diagnosis not present

## 2015-11-09 DIAGNOSIS — K222 Esophageal obstruction: Secondary | ICD-10-CM | POA: Diagnosis not present

## 2015-11-09 DIAGNOSIS — I25118 Atherosclerotic heart disease of native coronary artery with other forms of angina pectoris: Secondary | ICD-10-CM | POA: Diagnosis not present

## 2015-11-09 DIAGNOSIS — I10 Essential (primary) hypertension: Secondary | ICD-10-CM | POA: Diagnosis not present

## 2015-11-09 DIAGNOSIS — I119 Hypertensive heart disease without heart failure: Secondary | ICD-10-CM | POA: Diagnosis not present

## 2015-11-09 DIAGNOSIS — Z7952 Long term (current) use of systemic steroids: Secondary | ICD-10-CM | POA: Diagnosis not present

## 2015-11-09 DIAGNOSIS — Z79899 Other long term (current) drug therapy: Secondary | ICD-10-CM | POA: Diagnosis not present

## 2015-11-09 DIAGNOSIS — J449 Chronic obstructive pulmonary disease, unspecified: Secondary | ICD-10-CM | POA: Diagnosis not present

## 2015-11-09 DIAGNOSIS — Z7982 Long term (current) use of aspirin: Secondary | ICD-10-CM | POA: Diagnosis not present

## 2015-11-27 DIAGNOSIS — K219 Gastro-esophageal reflux disease without esophagitis: Secondary | ICD-10-CM | POA: Diagnosis not present

## 2015-11-27 DIAGNOSIS — Z89512 Acquired absence of left leg below knee: Secondary | ICD-10-CM | POA: Diagnosis not present

## 2015-11-27 DIAGNOSIS — I10 Essential (primary) hypertension: Secondary | ICD-10-CM | POA: Diagnosis not present

## 2015-11-27 DIAGNOSIS — M159 Polyosteoarthritis, unspecified: Secondary | ICD-10-CM | POA: Diagnosis not present

## 2015-11-27 DIAGNOSIS — E7800 Pure hypercholesterolemia, unspecified: Secondary | ICD-10-CM | POA: Diagnosis not present

## 2015-11-28 DIAGNOSIS — E039 Hypothyroidism, unspecified: Secondary | ICD-10-CM | POA: Diagnosis not present

## 2015-11-29 DIAGNOSIS — J449 Chronic obstructive pulmonary disease, unspecified: Secondary | ICD-10-CM | POA: Diagnosis not present

## 2015-12-03 DIAGNOSIS — Z89512 Acquired absence of left leg below knee: Secondary | ICD-10-CM | POA: Diagnosis not present

## 2015-12-03 DIAGNOSIS — J449 Chronic obstructive pulmonary disease, unspecified: Secondary | ICD-10-CM | POA: Diagnosis not present

## 2015-12-03 DIAGNOSIS — I2510 Atherosclerotic heart disease of native coronary artery without angina pectoris: Secondary | ICD-10-CM | POA: Diagnosis not present

## 2015-12-03 DIAGNOSIS — M159 Polyosteoarthritis, unspecified: Secondary | ICD-10-CM | POA: Diagnosis not present

## 2015-12-07 DIAGNOSIS — J449 Chronic obstructive pulmonary disease, unspecified: Secondary | ICD-10-CM | POA: Diagnosis not present

## 2015-12-25 DIAGNOSIS — K219 Gastro-esophageal reflux disease without esophagitis: Secondary | ICD-10-CM | POA: Diagnosis not present

## 2015-12-25 DIAGNOSIS — Z79891 Long term (current) use of opiate analgesic: Secondary | ICD-10-CM | POA: Diagnosis not present

## 2015-12-25 DIAGNOSIS — E7800 Pure hypercholesterolemia, unspecified: Secondary | ICD-10-CM | POA: Diagnosis not present

## 2015-12-25 DIAGNOSIS — Z89512 Acquired absence of left leg below knee: Secondary | ICD-10-CM | POA: Diagnosis not present

## 2015-12-25 DIAGNOSIS — I10 Essential (primary) hypertension: Secondary | ICD-10-CM | POA: Diagnosis not present

## 2015-12-28 DIAGNOSIS — E039 Hypothyroidism, unspecified: Secondary | ICD-10-CM | POA: Diagnosis not present

## 2015-12-29 DIAGNOSIS — J449 Chronic obstructive pulmonary disease, unspecified: Secondary | ICD-10-CM | POA: Diagnosis not present

## 2016-01-02 DIAGNOSIS — I2510 Atherosclerotic heart disease of native coronary artery without angina pectoris: Secondary | ICD-10-CM | POA: Diagnosis not present

## 2016-01-02 DIAGNOSIS — J449 Chronic obstructive pulmonary disease, unspecified: Secondary | ICD-10-CM | POA: Diagnosis not present

## 2016-01-02 DIAGNOSIS — Z89512 Acquired absence of left leg below knee: Secondary | ICD-10-CM | POA: Diagnosis not present

## 2016-01-02 DIAGNOSIS — M159 Polyosteoarthritis, unspecified: Secondary | ICD-10-CM | POA: Diagnosis not present

## 2016-01-06 DIAGNOSIS — J449 Chronic obstructive pulmonary disease, unspecified: Secondary | ICD-10-CM | POA: Diagnosis not present

## 2016-01-23 DIAGNOSIS — M159 Polyosteoarthritis, unspecified: Secondary | ICD-10-CM | POA: Diagnosis not present

## 2016-01-23 DIAGNOSIS — Z89512 Acquired absence of left leg below knee: Secondary | ICD-10-CM | POA: Diagnosis not present

## 2016-01-23 DIAGNOSIS — I10 Essential (primary) hypertension: Secondary | ICD-10-CM | POA: Diagnosis not present

## 2016-01-23 DIAGNOSIS — E7800 Pure hypercholesterolemia, unspecified: Secondary | ICD-10-CM | POA: Diagnosis not present

## 2016-01-23 DIAGNOSIS — K219 Gastro-esophageal reflux disease without esophagitis: Secondary | ICD-10-CM | POA: Diagnosis not present

## 2016-01-28 DIAGNOSIS — E039 Hypothyroidism, unspecified: Secondary | ICD-10-CM | POA: Diagnosis not present

## 2016-01-29 DIAGNOSIS — J449 Chronic obstructive pulmonary disease, unspecified: Secondary | ICD-10-CM | POA: Diagnosis not present

## 2016-02-06 DIAGNOSIS — J449 Chronic obstructive pulmonary disease, unspecified: Secondary | ICD-10-CM | POA: Diagnosis not present

## 2016-02-28 DIAGNOSIS — J309 Allergic rhinitis, unspecified: Secondary | ICD-10-CM | POA: Diagnosis not present

## 2016-02-28 DIAGNOSIS — M159 Polyosteoarthritis, unspecified: Secondary | ICD-10-CM | POA: Diagnosis not present

## 2016-02-28 DIAGNOSIS — I10 Essential (primary) hypertension: Secondary | ICD-10-CM | POA: Diagnosis not present

## 2016-02-28 DIAGNOSIS — K219 Gastro-esophageal reflux disease without esophagitis: Secondary | ICD-10-CM | POA: Diagnosis not present

## 2016-02-28 DIAGNOSIS — E7800 Pure hypercholesterolemia, unspecified: Secondary | ICD-10-CM | POA: Diagnosis not present

## 2016-02-28 DIAGNOSIS — E039 Hypothyroidism, unspecified: Secondary | ICD-10-CM | POA: Diagnosis not present

## 2016-02-29 DIAGNOSIS — J449 Chronic obstructive pulmonary disease, unspecified: Secondary | ICD-10-CM | POA: Diagnosis not present

## 2016-03-08 DIAGNOSIS — J449 Chronic obstructive pulmonary disease, unspecified: Secondary | ICD-10-CM | POA: Diagnosis not present

## 2016-03-28 DIAGNOSIS — I10 Essential (primary) hypertension: Secondary | ICD-10-CM | POA: Diagnosis not present

## 2016-03-28 DIAGNOSIS — H6691 Otitis media, unspecified, right ear: Secondary | ICD-10-CM | POA: Diagnosis not present

## 2016-03-28 DIAGNOSIS — K219 Gastro-esophageal reflux disease without esophagitis: Secondary | ICD-10-CM | POA: Diagnosis not present

## 2016-03-28 DIAGNOSIS — Z79891 Long term (current) use of opiate analgesic: Secondary | ICD-10-CM | POA: Diagnosis not present

## 2016-03-28 DIAGNOSIS — E7800 Pure hypercholesterolemia, unspecified: Secondary | ICD-10-CM | POA: Diagnosis not present

## 2016-03-29 DIAGNOSIS — E039 Hypothyroidism, unspecified: Secondary | ICD-10-CM | POA: Diagnosis not present

## 2016-03-30 DIAGNOSIS — J449 Chronic obstructive pulmonary disease, unspecified: Secondary | ICD-10-CM | POA: Diagnosis not present

## 2016-04-05 DIAGNOSIS — R10816 Epigastric abdominal tenderness: Secondary | ICD-10-CM | POA: Diagnosis not present

## 2016-04-05 DIAGNOSIS — I2510 Atherosclerotic heart disease of native coronary artery without angina pectoris: Secondary | ICD-10-CM | POA: Diagnosis not present

## 2016-04-05 DIAGNOSIS — Z8249 Family history of ischemic heart disease and other diseases of the circulatory system: Secondary | ICD-10-CM | POA: Diagnosis not present

## 2016-04-05 DIAGNOSIS — R079 Chest pain, unspecified: Secondary | ICD-10-CM | POA: Diagnosis not present

## 2016-04-05 DIAGNOSIS — J449 Chronic obstructive pulmonary disease, unspecified: Secondary | ICD-10-CM | POA: Diagnosis not present

## 2016-04-05 DIAGNOSIS — G8929 Other chronic pain: Secondary | ICD-10-CM | POA: Diagnosis not present

## 2016-04-05 DIAGNOSIS — Z955 Presence of coronary angioplasty implant and graft: Secondary | ICD-10-CM | POA: Diagnosis not present

## 2016-04-05 DIAGNOSIS — I252 Old myocardial infarction: Secondary | ICD-10-CM | POA: Diagnosis not present

## 2016-04-05 DIAGNOSIS — Z7982 Long term (current) use of aspirin: Secondary | ICD-10-CM | POA: Diagnosis not present

## 2016-04-05 DIAGNOSIS — R110 Nausea: Secondary | ICD-10-CM | POA: Diagnosis not present

## 2016-04-05 DIAGNOSIS — I739 Peripheral vascular disease, unspecified: Secondary | ICD-10-CM | POA: Diagnosis not present

## 2016-04-05 DIAGNOSIS — Z780 Asymptomatic menopausal state: Secondary | ICD-10-CM | POA: Diagnosis not present

## 2016-04-05 DIAGNOSIS — M1990 Unspecified osteoarthritis, unspecified site: Secondary | ICD-10-CM | POA: Diagnosis not present

## 2016-04-05 DIAGNOSIS — J45909 Unspecified asthma, uncomplicated: Secondary | ICD-10-CM | POA: Diagnosis not present

## 2016-04-05 DIAGNOSIS — M810 Age-related osteoporosis without current pathological fracture: Secondary | ICD-10-CM | POA: Diagnosis not present

## 2016-04-05 DIAGNOSIS — K222 Esophageal obstruction: Secondary | ICD-10-CM | POA: Diagnosis not present

## 2016-04-05 DIAGNOSIS — Z9889 Other specified postprocedural states: Secondary | ICD-10-CM | POA: Diagnosis not present

## 2016-04-05 DIAGNOSIS — J439 Emphysema, unspecified: Secondary | ICD-10-CM | POA: Diagnosis not present

## 2016-04-05 DIAGNOSIS — R0602 Shortness of breath: Secondary | ICD-10-CM | POA: Diagnosis not present

## 2016-04-05 DIAGNOSIS — Z79899 Other long term (current) drug therapy: Secondary | ICD-10-CM | POA: Diagnosis not present

## 2016-04-05 DIAGNOSIS — J8410 Pulmonary fibrosis, unspecified: Secondary | ICD-10-CM | POA: Diagnosis not present

## 2016-04-05 DIAGNOSIS — R0789 Other chest pain: Secondary | ICD-10-CM | POA: Diagnosis not present

## 2016-04-06 DIAGNOSIS — R0789 Other chest pain: Secondary | ICD-10-CM | POA: Diagnosis not present

## 2016-04-06 DIAGNOSIS — R9431 Abnormal electrocardiogram [ECG] [EKG]: Secondary | ICD-10-CM | POA: Diagnosis not present

## 2016-04-07 DIAGNOSIS — J449 Chronic obstructive pulmonary disease, unspecified: Secondary | ICD-10-CM | POA: Diagnosis not present

## 2016-04-16 DIAGNOSIS — I2510 Atherosclerotic heart disease of native coronary artery without angina pectoris: Secondary | ICD-10-CM | POA: Diagnosis not present

## 2016-04-23 DIAGNOSIS — K219 Gastro-esophageal reflux disease without esophagitis: Secondary | ICD-10-CM | POA: Diagnosis not present

## 2016-04-23 DIAGNOSIS — R1310 Dysphagia, unspecified: Secondary | ICD-10-CM | POA: Diagnosis not present

## 2016-04-23 DIAGNOSIS — K225 Diverticulum of esophagus, acquired: Secondary | ICD-10-CM | POA: Diagnosis not present

## 2016-04-23 DIAGNOSIS — K449 Diaphragmatic hernia without obstruction or gangrene: Secondary | ICD-10-CM | POA: Diagnosis not present

## 2016-04-25 DIAGNOSIS — E7800 Pure hypercholesterolemia, unspecified: Secondary | ICD-10-CM | POA: Diagnosis not present

## 2016-04-25 DIAGNOSIS — I10 Essential (primary) hypertension: Secondary | ICD-10-CM | POA: Diagnosis not present

## 2016-04-25 DIAGNOSIS — K219 Gastro-esophageal reflux disease without esophagitis: Secondary | ICD-10-CM | POA: Diagnosis not present

## 2016-04-25 DIAGNOSIS — Z23 Encounter for immunization: Secondary | ICD-10-CM | POA: Diagnosis not present

## 2016-04-25 DIAGNOSIS — M159 Polyosteoarthritis, unspecified: Secondary | ICD-10-CM | POA: Diagnosis not present

## 2016-04-29 DIAGNOSIS — E039 Hypothyroidism, unspecified: Secondary | ICD-10-CM | POA: Diagnosis not present

## 2016-04-30 DIAGNOSIS — J449 Chronic obstructive pulmonary disease, unspecified: Secondary | ICD-10-CM | POA: Diagnosis not present

## 2016-05-08 DIAGNOSIS — J449 Chronic obstructive pulmonary disease, unspecified: Secondary | ICD-10-CM | POA: Diagnosis not present

## 2016-05-23 DIAGNOSIS — Q393 Congenital stenosis and stricture of esophagus: Secondary | ICD-10-CM | POA: Diagnosis not present

## 2016-05-23 DIAGNOSIS — K225 Diverticulum of esophagus, acquired: Secondary | ICD-10-CM | POA: Diagnosis not present

## 2016-05-23 DIAGNOSIS — M069 Rheumatoid arthritis, unspecified: Secondary | ICD-10-CM | POA: Diagnosis not present

## 2016-05-23 DIAGNOSIS — K449 Diaphragmatic hernia without obstruction or gangrene: Secondary | ICD-10-CM | POA: Diagnosis not present

## 2016-05-23 DIAGNOSIS — R1310 Dysphagia, unspecified: Secondary | ICD-10-CM | POA: Diagnosis not present

## 2016-05-23 DIAGNOSIS — J449 Chronic obstructive pulmonary disease, unspecified: Secondary | ICD-10-CM | POA: Diagnosis not present

## 2016-05-23 DIAGNOSIS — Z89512 Acquired absence of left leg below knee: Secondary | ICD-10-CM | POA: Diagnosis not present

## 2016-05-23 DIAGNOSIS — K228 Other specified diseases of esophagus: Secondary | ICD-10-CM | POA: Diagnosis not present

## 2016-05-23 DIAGNOSIS — I10 Essential (primary) hypertension: Secondary | ICD-10-CM | POA: Diagnosis not present

## 2016-05-23 DIAGNOSIS — K2970 Gastritis, unspecified, without bleeding: Secondary | ICD-10-CM | POA: Diagnosis not present

## 2016-05-23 DIAGNOSIS — E039 Hypothyroidism, unspecified: Secondary | ICD-10-CM | POA: Diagnosis not present

## 2016-05-23 DIAGNOSIS — K222 Esophageal obstruction: Secondary | ICD-10-CM | POA: Diagnosis not present

## 2016-05-23 DIAGNOSIS — E7800 Pure hypercholesterolemia, unspecified: Secondary | ICD-10-CM | POA: Diagnosis not present

## 2016-05-24 DIAGNOSIS — M159 Polyosteoarthritis, unspecified: Secondary | ICD-10-CM | POA: Diagnosis not present

## 2016-05-24 DIAGNOSIS — K219 Gastro-esophageal reflux disease without esophagitis: Secondary | ICD-10-CM | POA: Diagnosis not present

## 2016-05-24 DIAGNOSIS — I10 Essential (primary) hypertension: Secondary | ICD-10-CM | POA: Diagnosis not present

## 2016-05-24 DIAGNOSIS — J309 Allergic rhinitis, unspecified: Secondary | ICD-10-CM | POA: Diagnosis not present

## 2016-05-24 DIAGNOSIS — E7800 Pure hypercholesterolemia, unspecified: Secondary | ICD-10-CM | POA: Diagnosis not present

## 2016-05-29 DIAGNOSIS — E039 Hypothyroidism, unspecified: Secondary | ICD-10-CM | POA: Diagnosis not present

## 2016-05-30 DIAGNOSIS — J449 Chronic obstructive pulmonary disease, unspecified: Secondary | ICD-10-CM | POA: Diagnosis not present

## 2016-06-07 DIAGNOSIS — J449 Chronic obstructive pulmonary disease, unspecified: Secondary | ICD-10-CM | POA: Diagnosis not present

## 2016-06-20 DIAGNOSIS — I10 Essential (primary) hypertension: Secondary | ICD-10-CM | POA: Diagnosis not present

## 2016-06-20 DIAGNOSIS — E7800 Pure hypercholesterolemia, unspecified: Secondary | ICD-10-CM | POA: Diagnosis not present

## 2016-06-20 DIAGNOSIS — J309 Allergic rhinitis, unspecified: Secondary | ICD-10-CM | POA: Diagnosis not present

## 2016-06-20 DIAGNOSIS — K219 Gastro-esophageal reflux disease without esophagitis: Secondary | ICD-10-CM | POA: Diagnosis not present

## 2016-06-20 DIAGNOSIS — M159 Polyosteoarthritis, unspecified: Secondary | ICD-10-CM | POA: Diagnosis not present

## 2016-06-29 DIAGNOSIS — E039 Hypothyroidism, unspecified: Secondary | ICD-10-CM | POA: Diagnosis not present

## 2016-07-08 DIAGNOSIS — J449 Chronic obstructive pulmonary disease, unspecified: Secondary | ICD-10-CM | POA: Diagnosis not present

## 2016-07-24 DIAGNOSIS — K219 Gastro-esophageal reflux disease without esophagitis: Secondary | ICD-10-CM | POA: Diagnosis not present

## 2016-07-24 DIAGNOSIS — J449 Chronic obstructive pulmonary disease, unspecified: Secondary | ICD-10-CM | POA: Diagnosis not present

## 2016-07-24 DIAGNOSIS — I10 Essential (primary) hypertension: Secondary | ICD-10-CM | POA: Diagnosis not present

## 2016-07-24 DIAGNOSIS — Z1389 Encounter for screening for other disorder: Secondary | ICD-10-CM | POA: Diagnosis not present

## 2016-07-30 DIAGNOSIS — E039 Hypothyroidism, unspecified: Secondary | ICD-10-CM | POA: Diagnosis not present

## 2016-08-07 DIAGNOSIS — J449 Chronic obstructive pulmonary disease, unspecified: Secondary | ICD-10-CM | POA: Diagnosis not present

## 2016-08-15 DIAGNOSIS — K219 Gastro-esophageal reflux disease without esophagitis: Secondary | ICD-10-CM | POA: Diagnosis not present

## 2016-08-15 DIAGNOSIS — E7800 Pure hypercholesterolemia, unspecified: Secondary | ICD-10-CM | POA: Diagnosis not present

## 2016-08-15 DIAGNOSIS — M159 Polyosteoarthritis, unspecified: Secondary | ICD-10-CM | POA: Diagnosis not present

## 2016-08-15 DIAGNOSIS — I10 Essential (primary) hypertension: Secondary | ICD-10-CM | POA: Diagnosis not present

## 2016-08-15 DIAGNOSIS — J449 Chronic obstructive pulmonary disease, unspecified: Secondary | ICD-10-CM | POA: Diagnosis not present

## 2016-08-27 DIAGNOSIS — E039 Hypothyroidism, unspecified: Secondary | ICD-10-CM | POA: Diagnosis not present

## 2016-09-05 DIAGNOSIS — J449 Chronic obstructive pulmonary disease, unspecified: Secondary | ICD-10-CM | POA: Diagnosis not present

## 2016-09-12 DIAGNOSIS — E7800 Pure hypercholesterolemia, unspecified: Secondary | ICD-10-CM | POA: Diagnosis not present

## 2016-09-12 DIAGNOSIS — K208 Other esophagitis: Secondary | ICD-10-CM | POA: Diagnosis not present

## 2016-09-12 DIAGNOSIS — I10 Essential (primary) hypertension: Secondary | ICD-10-CM | POA: Diagnosis not present

## 2016-09-12 DIAGNOSIS — K219 Gastro-esophageal reflux disease without esophagitis: Secondary | ICD-10-CM | POA: Diagnosis not present

## 2016-09-12 DIAGNOSIS — Z79899 Other long term (current) drug therapy: Secondary | ICD-10-CM | POA: Diagnosis not present

## 2016-09-12 DIAGNOSIS — K259 Gastric ulcer, unspecified as acute or chronic, without hemorrhage or perforation: Secondary | ICD-10-CM | POA: Diagnosis not present

## 2016-09-12 DIAGNOSIS — M069 Rheumatoid arthritis, unspecified: Secondary | ICD-10-CM | POA: Diagnosis not present

## 2016-09-12 DIAGNOSIS — K222 Esophageal obstruction: Secondary | ICD-10-CM | POA: Diagnosis not present

## 2016-09-12 DIAGNOSIS — Q393 Congenital stenosis and stricture of esophagus: Secondary | ICD-10-CM | POA: Diagnosis not present

## 2016-09-12 DIAGNOSIS — E039 Hypothyroidism, unspecified: Secondary | ICD-10-CM | POA: Diagnosis not present

## 2016-09-12 DIAGNOSIS — K253 Acute gastric ulcer without hemorrhage or perforation: Secondary | ICD-10-CM | POA: Diagnosis not present

## 2016-09-12 DIAGNOSIS — K225 Diverticulum of esophagus, acquired: Secondary | ICD-10-CM | POA: Diagnosis not present

## 2016-09-12 DIAGNOSIS — K449 Diaphragmatic hernia without obstruction or gangrene: Secondary | ICD-10-CM | POA: Diagnosis not present

## 2016-09-12 DIAGNOSIS — K2970 Gastritis, unspecified, without bleeding: Secondary | ICD-10-CM | POA: Diagnosis not present

## 2016-09-12 DIAGNOSIS — J449 Chronic obstructive pulmonary disease, unspecified: Secondary | ICD-10-CM | POA: Diagnosis not present

## 2016-09-12 DIAGNOSIS — K228 Other specified diseases of esophagus: Secondary | ICD-10-CM | POA: Diagnosis not present

## 2016-09-12 DIAGNOSIS — R1310 Dysphagia, unspecified: Secondary | ICD-10-CM | POA: Diagnosis not present

## 2016-09-12 HISTORY — PX: ESOPHAGOGASTRODUODENOSCOPY: SHX1529

## 2016-09-20 DIAGNOSIS — E7800 Pure hypercholesterolemia, unspecified: Secondary | ICD-10-CM | POA: Diagnosis not present

## 2016-09-20 DIAGNOSIS — M159 Polyosteoarthritis, unspecified: Secondary | ICD-10-CM | POA: Diagnosis not present

## 2016-09-20 DIAGNOSIS — G4700 Insomnia, unspecified: Secondary | ICD-10-CM | POA: Diagnosis not present

## 2016-09-20 DIAGNOSIS — J449 Chronic obstructive pulmonary disease, unspecified: Secondary | ICD-10-CM | POA: Diagnosis not present

## 2016-09-20 DIAGNOSIS — I10 Essential (primary) hypertension: Secondary | ICD-10-CM | POA: Diagnosis not present

## 2016-09-20 DIAGNOSIS — Z79891 Long term (current) use of opiate analgesic: Secondary | ICD-10-CM | POA: Diagnosis not present

## 2016-09-27 DIAGNOSIS — E039 Hypothyroidism, unspecified: Secondary | ICD-10-CM | POA: Diagnosis not present

## 2016-10-06 DIAGNOSIS — J449 Chronic obstructive pulmonary disease, unspecified: Secondary | ICD-10-CM | POA: Diagnosis not present

## 2016-10-18 DIAGNOSIS — Z139 Encounter for screening, unspecified: Secondary | ICD-10-CM | POA: Diagnosis not present

## 2016-10-18 DIAGNOSIS — J449 Chronic obstructive pulmonary disease, unspecified: Secondary | ICD-10-CM | POA: Diagnosis not present

## 2016-10-18 DIAGNOSIS — M069 Rheumatoid arthritis, unspecified: Secondary | ICD-10-CM | POA: Diagnosis not present

## 2016-10-18 DIAGNOSIS — Z9181 History of falling: Secondary | ICD-10-CM | POA: Diagnosis not present

## 2016-10-18 DIAGNOSIS — G4700 Insomnia, unspecified: Secondary | ICD-10-CM | POA: Diagnosis not present

## 2016-10-18 DIAGNOSIS — M159 Polyosteoarthritis, unspecified: Secondary | ICD-10-CM | POA: Diagnosis not present

## 2016-10-27 DIAGNOSIS — E039 Hypothyroidism, unspecified: Secondary | ICD-10-CM | POA: Diagnosis not present

## 2016-11-05 DIAGNOSIS — J449 Chronic obstructive pulmonary disease, unspecified: Secondary | ICD-10-CM | POA: Diagnosis not present

## 2016-11-15 DIAGNOSIS — M159 Polyosteoarthritis, unspecified: Secondary | ICD-10-CM | POA: Diagnosis not present

## 2016-11-15 DIAGNOSIS — K219 Gastro-esophageal reflux disease without esophagitis: Secondary | ICD-10-CM | POA: Diagnosis not present

## 2016-11-15 DIAGNOSIS — I10 Essential (primary) hypertension: Secondary | ICD-10-CM | POA: Diagnosis not present

## 2016-11-15 DIAGNOSIS — J449 Chronic obstructive pulmonary disease, unspecified: Secondary | ICD-10-CM | POA: Diagnosis not present

## 2016-11-27 DIAGNOSIS — E039 Hypothyroidism, unspecified: Secondary | ICD-10-CM | POA: Diagnosis not present

## 2016-11-27 DIAGNOSIS — M2550 Pain in unspecified joint: Secondary | ICD-10-CM | POA: Diagnosis not present

## 2016-11-27 DIAGNOSIS — M0579 Rheumatoid arthritis with rheumatoid factor of multiple sites without organ or systems involvement: Secondary | ICD-10-CM | POA: Diagnosis not present

## 2016-11-27 DIAGNOSIS — M1990 Unspecified osteoarthritis, unspecified site: Secondary | ICD-10-CM | POA: Diagnosis not present

## 2016-11-27 DIAGNOSIS — M79643 Pain in unspecified hand: Secondary | ICD-10-CM | POA: Diagnosis not present

## 2016-12-06 DIAGNOSIS — J449 Chronic obstructive pulmonary disease, unspecified: Secondary | ICD-10-CM | POA: Diagnosis not present

## 2016-12-20 DIAGNOSIS — Z79891 Long term (current) use of opiate analgesic: Secondary | ICD-10-CM | POA: Diagnosis not present

## 2016-12-20 DIAGNOSIS — K219 Gastro-esophageal reflux disease without esophagitis: Secondary | ICD-10-CM | POA: Diagnosis not present

## 2016-12-20 DIAGNOSIS — J449 Chronic obstructive pulmonary disease, unspecified: Secondary | ICD-10-CM | POA: Diagnosis not present

## 2016-12-20 DIAGNOSIS — Z89512 Acquired absence of left leg below knee: Secondary | ICD-10-CM | POA: Diagnosis not present

## 2016-12-20 DIAGNOSIS — I10 Essential (primary) hypertension: Secondary | ICD-10-CM | POA: Diagnosis not present

## 2016-12-27 DIAGNOSIS — E039 Hypothyroidism, unspecified: Secondary | ICD-10-CM | POA: Diagnosis not present

## 2016-12-31 DIAGNOSIS — M549 Dorsalgia, unspecified: Secondary | ICD-10-CM | POA: Diagnosis not present

## 2016-12-31 DIAGNOSIS — Z7689 Persons encountering health services in other specified circumstances: Secondary | ICD-10-CM | POA: Diagnosis not present

## 2016-12-31 DIAGNOSIS — M0579 Rheumatoid arthritis with rheumatoid factor of multiple sites without organ or systems involvement: Secondary | ICD-10-CM | POA: Diagnosis not present

## 2016-12-31 DIAGNOSIS — M25569 Pain in unspecified knee: Secondary | ICD-10-CM | POA: Diagnosis not present

## 2016-12-31 DIAGNOSIS — M1990 Unspecified osteoarthritis, unspecified site: Secondary | ICD-10-CM | POA: Diagnosis not present

## 2016-12-31 DIAGNOSIS — M79643 Pain in unspecified hand: Secondary | ICD-10-CM | POA: Diagnosis not present

## 2017-01-05 DIAGNOSIS — J449 Chronic obstructive pulmonary disease, unspecified: Secondary | ICD-10-CM | POA: Diagnosis not present

## 2017-01-27 DIAGNOSIS — E039 Hypothyroidism, unspecified: Secondary | ICD-10-CM | POA: Diagnosis not present

## 2017-02-03 DIAGNOSIS — H5203 Hypermetropia, bilateral: Secondary | ICD-10-CM | POA: Diagnosis not present

## 2017-02-04 DIAGNOSIS — K219 Gastro-esophageal reflux disease without esophagitis: Secondary | ICD-10-CM | POA: Diagnosis not present

## 2017-02-04 DIAGNOSIS — Z89512 Acquired absence of left leg below knee: Secondary | ICD-10-CM | POA: Diagnosis not present

## 2017-02-04 DIAGNOSIS — J449 Chronic obstructive pulmonary disease, unspecified: Secondary | ICD-10-CM | POA: Diagnosis not present

## 2017-02-04 DIAGNOSIS — I10 Essential (primary) hypertension: Secondary | ICD-10-CM | POA: Diagnosis not present

## 2017-02-04 DIAGNOSIS — L0390 Cellulitis, unspecified: Secondary | ICD-10-CM | POA: Diagnosis not present

## 2017-02-05 DIAGNOSIS — J449 Chronic obstructive pulmonary disease, unspecified: Secondary | ICD-10-CM | POA: Diagnosis not present

## 2017-02-11 DIAGNOSIS — M0579 Rheumatoid arthritis with rheumatoid factor of multiple sites without organ or systems involvement: Secondary | ICD-10-CM | POA: Diagnosis not present

## 2017-02-11 DIAGNOSIS — M79643 Pain in unspecified hand: Secondary | ICD-10-CM | POA: Diagnosis not present

## 2017-02-11 DIAGNOSIS — M1990 Unspecified osteoarthritis, unspecified site: Secondary | ICD-10-CM | POA: Diagnosis not present

## 2017-02-11 DIAGNOSIS — M549 Dorsalgia, unspecified: Secondary | ICD-10-CM | POA: Diagnosis not present

## 2017-02-18 DIAGNOSIS — Z9181 History of falling: Secondary | ICD-10-CM | POA: Diagnosis not present

## 2017-02-18 DIAGNOSIS — Z89512 Acquired absence of left leg below knee: Secondary | ICD-10-CM | POA: Diagnosis not present

## 2017-02-18 DIAGNOSIS — I10 Essential (primary) hypertension: Secondary | ICD-10-CM | POA: Diagnosis not present

## 2017-02-18 DIAGNOSIS — Z23 Encounter for immunization: Secondary | ICD-10-CM | POA: Diagnosis not present

## 2017-02-18 DIAGNOSIS — Z139 Encounter for screening, unspecified: Secondary | ICD-10-CM | POA: Diagnosis not present

## 2017-02-18 DIAGNOSIS — K219 Gastro-esophageal reflux disease without esophagitis: Secondary | ICD-10-CM | POA: Diagnosis not present

## 2017-02-18 DIAGNOSIS — E7800 Pure hypercholesterolemia, unspecified: Secondary | ICD-10-CM | POA: Diagnosis not present

## 2017-02-24 DIAGNOSIS — I10 Essential (primary) hypertension: Secondary | ICD-10-CM | POA: Diagnosis not present

## 2017-02-24 DIAGNOSIS — J309 Allergic rhinitis, unspecified: Secondary | ICD-10-CM | POA: Diagnosis not present

## 2017-02-24 DIAGNOSIS — E7800 Pure hypercholesterolemia, unspecified: Secondary | ICD-10-CM | POA: Diagnosis not present

## 2017-02-24 DIAGNOSIS — K219 Gastro-esophageal reflux disease without esophagitis: Secondary | ICD-10-CM | POA: Diagnosis not present

## 2017-02-24 DIAGNOSIS — L89159 Pressure ulcer of sacral region, unspecified stage: Secondary | ICD-10-CM | POA: Diagnosis not present

## 2017-02-27 DIAGNOSIS — E039 Hypothyroidism, unspecified: Secondary | ICD-10-CM | POA: Diagnosis not present

## 2017-02-28 DIAGNOSIS — J449 Chronic obstructive pulmonary disease, unspecified: Secondary | ICD-10-CM | POA: Diagnosis not present

## 2017-02-28 DIAGNOSIS — M069 Rheumatoid arthritis, unspecified: Secondary | ICD-10-CM | POA: Diagnosis not present

## 2017-02-28 DIAGNOSIS — L89154 Pressure ulcer of sacral region, stage 4: Secondary | ICD-10-CM | POA: Diagnosis not present

## 2017-02-28 DIAGNOSIS — I252 Old myocardial infarction: Secondary | ICD-10-CM | POA: Diagnosis not present

## 2017-03-03 DIAGNOSIS — L89154 Pressure ulcer of sacral region, stage 4: Secondary | ICD-10-CM | POA: Diagnosis not present

## 2017-03-05 DIAGNOSIS — M8588 Other specified disorders of bone density and structure, other site: Secondary | ICD-10-CM | POA: Diagnosis not present

## 2017-03-05 DIAGNOSIS — L89154 Pressure ulcer of sacral region, stage 4: Secondary | ICD-10-CM | POA: Diagnosis not present

## 2017-03-05 DIAGNOSIS — M1990 Unspecified osteoarthritis, unspecified site: Secondary | ICD-10-CM | POA: Diagnosis not present

## 2017-03-06 DIAGNOSIS — E7800 Pure hypercholesterolemia, unspecified: Secondary | ICD-10-CM | POA: Diagnosis not present

## 2017-03-06 DIAGNOSIS — L89159 Pressure ulcer of sacral region, unspecified stage: Secondary | ICD-10-CM | POA: Diagnosis not present

## 2017-03-06 DIAGNOSIS — Z23 Encounter for immunization: Secondary | ICD-10-CM | POA: Diagnosis not present

## 2017-03-06 DIAGNOSIS — I10 Essential (primary) hypertension: Secondary | ICD-10-CM | POA: Diagnosis not present

## 2017-03-06 DIAGNOSIS — K219 Gastro-esophageal reflux disease without esophagitis: Secondary | ICD-10-CM | POA: Diagnosis not present

## 2017-03-07 DIAGNOSIS — L89154 Pressure ulcer of sacral region, stage 4: Secondary | ICD-10-CM | POA: Diagnosis not present

## 2017-03-08 DIAGNOSIS — J449 Chronic obstructive pulmonary disease, unspecified: Secondary | ICD-10-CM | POA: Diagnosis not present

## 2017-03-10 DIAGNOSIS — M159 Polyosteoarthritis, unspecified: Secondary | ICD-10-CM | POA: Diagnosis not present

## 2017-03-10 DIAGNOSIS — Z9981 Dependence on supplemental oxygen: Secondary | ICD-10-CM | POA: Diagnosis not present

## 2017-03-10 DIAGNOSIS — M059 Rheumatoid arthritis with rheumatoid factor, unspecified: Secondary | ICD-10-CM | POA: Diagnosis not present

## 2017-03-10 DIAGNOSIS — K219 Gastro-esophageal reflux disease without esophagitis: Secondary | ICD-10-CM | POA: Diagnosis not present

## 2017-03-10 DIAGNOSIS — K769 Liver disease, unspecified: Secondary | ICD-10-CM | POA: Diagnosis not present

## 2017-03-10 DIAGNOSIS — J439 Emphysema, unspecified: Secondary | ICD-10-CM | POA: Diagnosis not present

## 2017-03-10 DIAGNOSIS — R1312 Dysphagia, oropharyngeal phase: Secondary | ICD-10-CM | POA: Diagnosis not present

## 2017-03-10 DIAGNOSIS — L89154 Pressure ulcer of sacral region, stage 4: Secondary | ICD-10-CM | POA: Diagnosis not present

## 2017-03-10 DIAGNOSIS — I2510 Atherosclerotic heart disease of native coronary artery without angina pectoris: Secondary | ICD-10-CM | POA: Diagnosis not present

## 2017-03-10 DIAGNOSIS — D649 Anemia, unspecified: Secondary | ICD-10-CM | POA: Diagnosis not present

## 2017-03-10 DIAGNOSIS — Z89512 Acquired absence of left leg below knee: Secondary | ICD-10-CM | POA: Diagnosis not present

## 2017-03-10 DIAGNOSIS — I10 Essential (primary) hypertension: Secondary | ICD-10-CM | POA: Diagnosis not present

## 2017-03-10 DIAGNOSIS — Z87891 Personal history of nicotine dependence: Secondary | ICD-10-CM | POA: Diagnosis not present

## 2017-03-10 DIAGNOSIS — E785 Hyperlipidemia, unspecified: Secondary | ICD-10-CM | POA: Diagnosis not present

## 2017-03-12 DIAGNOSIS — Z87891 Personal history of nicotine dependence: Secondary | ICD-10-CM | POA: Diagnosis not present

## 2017-03-12 DIAGNOSIS — M159 Polyosteoarthritis, unspecified: Secondary | ICD-10-CM | POA: Diagnosis not present

## 2017-03-12 DIAGNOSIS — Z9981 Dependence on supplemental oxygen: Secondary | ICD-10-CM | POA: Diagnosis not present

## 2017-03-12 DIAGNOSIS — K219 Gastro-esophageal reflux disease without esophagitis: Secondary | ICD-10-CM | POA: Diagnosis not present

## 2017-03-12 DIAGNOSIS — Z89512 Acquired absence of left leg below knee: Secondary | ICD-10-CM | POA: Diagnosis not present

## 2017-03-12 DIAGNOSIS — J439 Emphysema, unspecified: Secondary | ICD-10-CM | POA: Diagnosis not present

## 2017-03-12 DIAGNOSIS — E785 Hyperlipidemia, unspecified: Secondary | ICD-10-CM | POA: Diagnosis not present

## 2017-03-12 DIAGNOSIS — M059 Rheumatoid arthritis with rheumatoid factor, unspecified: Secondary | ICD-10-CM | POA: Diagnosis not present

## 2017-03-12 DIAGNOSIS — D649 Anemia, unspecified: Secondary | ICD-10-CM | POA: Diagnosis not present

## 2017-03-12 DIAGNOSIS — L89154 Pressure ulcer of sacral region, stage 4: Secondary | ICD-10-CM | POA: Diagnosis not present

## 2017-03-12 DIAGNOSIS — R1312 Dysphagia, oropharyngeal phase: Secondary | ICD-10-CM | POA: Diagnosis not present

## 2017-03-12 DIAGNOSIS — I2510 Atherosclerotic heart disease of native coronary artery without angina pectoris: Secondary | ICD-10-CM | POA: Diagnosis not present

## 2017-03-12 DIAGNOSIS — K769 Liver disease, unspecified: Secondary | ICD-10-CM | POA: Diagnosis not present

## 2017-03-12 DIAGNOSIS — I10 Essential (primary) hypertension: Secondary | ICD-10-CM | POA: Diagnosis not present

## 2017-03-14 DIAGNOSIS — L89154 Pressure ulcer of sacral region, stage 4: Secondary | ICD-10-CM | POA: Diagnosis not present

## 2017-03-17 DIAGNOSIS — L89154 Pressure ulcer of sacral region, stage 4: Secondary | ICD-10-CM | POA: Diagnosis not present

## 2017-03-17 DIAGNOSIS — M159 Polyosteoarthritis, unspecified: Secondary | ICD-10-CM | POA: Diagnosis not present

## 2017-03-17 DIAGNOSIS — R1312 Dysphagia, oropharyngeal phase: Secondary | ICD-10-CM | POA: Diagnosis not present

## 2017-03-17 DIAGNOSIS — M059 Rheumatoid arthritis with rheumatoid factor, unspecified: Secondary | ICD-10-CM | POA: Diagnosis not present

## 2017-03-17 DIAGNOSIS — K219 Gastro-esophageal reflux disease without esophagitis: Secondary | ICD-10-CM | POA: Diagnosis not present

## 2017-03-17 DIAGNOSIS — K769 Liver disease, unspecified: Secondary | ICD-10-CM | POA: Diagnosis not present

## 2017-03-17 DIAGNOSIS — E785 Hyperlipidemia, unspecified: Secondary | ICD-10-CM | POA: Diagnosis not present

## 2017-03-17 DIAGNOSIS — Z87891 Personal history of nicotine dependence: Secondary | ICD-10-CM | POA: Diagnosis not present

## 2017-03-17 DIAGNOSIS — D649 Anemia, unspecified: Secondary | ICD-10-CM | POA: Diagnosis not present

## 2017-03-17 DIAGNOSIS — Z89512 Acquired absence of left leg below knee: Secondary | ICD-10-CM | POA: Diagnosis not present

## 2017-03-17 DIAGNOSIS — J439 Emphysema, unspecified: Secondary | ICD-10-CM | POA: Diagnosis not present

## 2017-03-17 DIAGNOSIS — Z9981 Dependence on supplemental oxygen: Secondary | ICD-10-CM | POA: Diagnosis not present

## 2017-03-17 DIAGNOSIS — I2510 Atherosclerotic heart disease of native coronary artery without angina pectoris: Secondary | ICD-10-CM | POA: Diagnosis not present

## 2017-03-17 DIAGNOSIS — I10 Essential (primary) hypertension: Secondary | ICD-10-CM | POA: Diagnosis not present

## 2017-03-19 DIAGNOSIS — L89154 Pressure ulcer of sacral region, stage 4: Secondary | ICD-10-CM | POA: Diagnosis not present

## 2017-03-19 DIAGNOSIS — E785 Hyperlipidemia, unspecified: Secondary | ICD-10-CM | POA: Diagnosis not present

## 2017-03-19 DIAGNOSIS — I2510 Atherosclerotic heart disease of native coronary artery without angina pectoris: Secondary | ICD-10-CM | POA: Diagnosis not present

## 2017-03-19 DIAGNOSIS — K769 Liver disease, unspecified: Secondary | ICD-10-CM | POA: Diagnosis not present

## 2017-03-19 DIAGNOSIS — M059 Rheumatoid arthritis with rheumatoid factor, unspecified: Secondary | ICD-10-CM | POA: Diagnosis not present

## 2017-03-19 DIAGNOSIS — Z87891 Personal history of nicotine dependence: Secondary | ICD-10-CM | POA: Diagnosis not present

## 2017-03-19 DIAGNOSIS — Z89512 Acquired absence of left leg below knee: Secondary | ICD-10-CM | POA: Diagnosis not present

## 2017-03-19 DIAGNOSIS — D649 Anemia, unspecified: Secondary | ICD-10-CM | POA: Diagnosis not present

## 2017-03-19 DIAGNOSIS — K219 Gastro-esophageal reflux disease without esophagitis: Secondary | ICD-10-CM | POA: Diagnosis not present

## 2017-03-19 DIAGNOSIS — R1312 Dysphagia, oropharyngeal phase: Secondary | ICD-10-CM | POA: Diagnosis not present

## 2017-03-19 DIAGNOSIS — M159 Polyosteoarthritis, unspecified: Secondary | ICD-10-CM | POA: Diagnosis not present

## 2017-03-19 DIAGNOSIS — Z9981 Dependence on supplemental oxygen: Secondary | ICD-10-CM | POA: Diagnosis not present

## 2017-03-19 DIAGNOSIS — I10 Essential (primary) hypertension: Secondary | ICD-10-CM | POA: Diagnosis not present

## 2017-03-19 DIAGNOSIS — J439 Emphysema, unspecified: Secondary | ICD-10-CM | POA: Diagnosis not present

## 2017-03-21 DIAGNOSIS — L89154 Pressure ulcer of sacral region, stage 4: Secondary | ICD-10-CM | POA: Diagnosis not present

## 2017-03-24 DIAGNOSIS — K769 Liver disease, unspecified: Secondary | ICD-10-CM | POA: Diagnosis not present

## 2017-03-24 DIAGNOSIS — M159 Polyosteoarthritis, unspecified: Secondary | ICD-10-CM | POA: Diagnosis not present

## 2017-03-24 DIAGNOSIS — J439 Emphysema, unspecified: Secondary | ICD-10-CM | POA: Diagnosis not present

## 2017-03-24 DIAGNOSIS — R1312 Dysphagia, oropharyngeal phase: Secondary | ICD-10-CM | POA: Diagnosis not present

## 2017-03-24 DIAGNOSIS — L89154 Pressure ulcer of sacral region, stage 4: Secondary | ICD-10-CM | POA: Diagnosis not present

## 2017-03-24 DIAGNOSIS — Z87891 Personal history of nicotine dependence: Secondary | ICD-10-CM | POA: Diagnosis not present

## 2017-03-24 DIAGNOSIS — I10 Essential (primary) hypertension: Secondary | ICD-10-CM | POA: Diagnosis not present

## 2017-03-24 DIAGNOSIS — M059 Rheumatoid arthritis with rheumatoid factor, unspecified: Secondary | ICD-10-CM | POA: Diagnosis not present

## 2017-03-24 DIAGNOSIS — Z89512 Acquired absence of left leg below knee: Secondary | ICD-10-CM | POA: Diagnosis not present

## 2017-03-24 DIAGNOSIS — I2510 Atherosclerotic heart disease of native coronary artery without angina pectoris: Secondary | ICD-10-CM | POA: Diagnosis not present

## 2017-03-24 DIAGNOSIS — Z9981 Dependence on supplemental oxygen: Secondary | ICD-10-CM | POA: Diagnosis not present

## 2017-03-24 DIAGNOSIS — K219 Gastro-esophageal reflux disease without esophagitis: Secondary | ICD-10-CM | POA: Diagnosis not present

## 2017-03-24 DIAGNOSIS — E785 Hyperlipidemia, unspecified: Secondary | ICD-10-CM | POA: Diagnosis not present

## 2017-03-24 DIAGNOSIS — D649 Anemia, unspecified: Secondary | ICD-10-CM | POA: Diagnosis not present

## 2017-03-26 DIAGNOSIS — L89154 Pressure ulcer of sacral region, stage 4: Secondary | ICD-10-CM | POA: Diagnosis not present

## 2017-03-26 DIAGNOSIS — K769 Liver disease, unspecified: Secondary | ICD-10-CM | POA: Diagnosis not present

## 2017-03-26 DIAGNOSIS — K219 Gastro-esophageal reflux disease without esophagitis: Secondary | ICD-10-CM | POA: Diagnosis not present

## 2017-03-26 DIAGNOSIS — Z9981 Dependence on supplemental oxygen: Secondary | ICD-10-CM | POA: Diagnosis not present

## 2017-03-26 DIAGNOSIS — R1312 Dysphagia, oropharyngeal phase: Secondary | ICD-10-CM | POA: Diagnosis not present

## 2017-03-26 DIAGNOSIS — J439 Emphysema, unspecified: Secondary | ICD-10-CM | POA: Diagnosis not present

## 2017-03-26 DIAGNOSIS — M159 Polyosteoarthritis, unspecified: Secondary | ICD-10-CM | POA: Diagnosis not present

## 2017-03-26 DIAGNOSIS — Z87891 Personal history of nicotine dependence: Secondary | ICD-10-CM | POA: Diagnosis not present

## 2017-03-26 DIAGNOSIS — I10 Essential (primary) hypertension: Secondary | ICD-10-CM | POA: Diagnosis not present

## 2017-03-26 DIAGNOSIS — I2510 Atherosclerotic heart disease of native coronary artery without angina pectoris: Secondary | ICD-10-CM | POA: Diagnosis not present

## 2017-03-26 DIAGNOSIS — E785 Hyperlipidemia, unspecified: Secondary | ICD-10-CM | POA: Diagnosis not present

## 2017-03-26 DIAGNOSIS — D649 Anemia, unspecified: Secondary | ICD-10-CM | POA: Diagnosis not present

## 2017-03-26 DIAGNOSIS — M059 Rheumatoid arthritis with rheumatoid factor, unspecified: Secondary | ICD-10-CM | POA: Diagnosis not present

## 2017-03-26 DIAGNOSIS — Z89512 Acquired absence of left leg below knee: Secondary | ICD-10-CM | POA: Diagnosis not present

## 2017-03-28 DIAGNOSIS — L89154 Pressure ulcer of sacral region, stage 4: Secondary | ICD-10-CM | POA: Diagnosis not present

## 2017-03-29 DIAGNOSIS — E039 Hypothyroidism, unspecified: Secondary | ICD-10-CM | POA: Diagnosis not present

## 2017-04-01 DIAGNOSIS — Z89512 Acquired absence of left leg below knee: Secondary | ICD-10-CM | POA: Diagnosis not present

## 2017-04-01 DIAGNOSIS — I10 Essential (primary) hypertension: Secondary | ICD-10-CM | POA: Diagnosis not present

## 2017-04-01 DIAGNOSIS — L89154 Pressure ulcer of sacral region, stage 4: Secondary | ICD-10-CM | POA: Diagnosis not present

## 2017-04-01 DIAGNOSIS — D649 Anemia, unspecified: Secondary | ICD-10-CM | POA: Diagnosis not present

## 2017-04-01 DIAGNOSIS — J439 Emphysema, unspecified: Secondary | ICD-10-CM | POA: Diagnosis not present

## 2017-04-01 DIAGNOSIS — K769 Liver disease, unspecified: Secondary | ICD-10-CM | POA: Diagnosis not present

## 2017-04-01 DIAGNOSIS — M0579 Rheumatoid arthritis with rheumatoid factor of multiple sites without organ or systems involvement: Secondary | ICD-10-CM | POA: Diagnosis not present

## 2017-04-01 DIAGNOSIS — M159 Polyosteoarthritis, unspecified: Secondary | ICD-10-CM | POA: Diagnosis not present

## 2017-04-01 DIAGNOSIS — Z9981 Dependence on supplemental oxygen: Secondary | ICD-10-CM | POA: Diagnosis not present

## 2017-04-01 DIAGNOSIS — M059 Rheumatoid arthritis with rheumatoid factor, unspecified: Secondary | ICD-10-CM | POA: Diagnosis not present

## 2017-04-01 DIAGNOSIS — M79643 Pain in unspecified hand: Secondary | ICD-10-CM | POA: Diagnosis not present

## 2017-04-01 DIAGNOSIS — M1990 Unspecified osteoarthritis, unspecified site: Secondary | ICD-10-CM | POA: Diagnosis not present

## 2017-04-01 DIAGNOSIS — Z87891 Personal history of nicotine dependence: Secondary | ICD-10-CM | POA: Diagnosis not present

## 2017-04-01 DIAGNOSIS — K219 Gastro-esophageal reflux disease without esophagitis: Secondary | ICD-10-CM | POA: Diagnosis not present

## 2017-04-01 DIAGNOSIS — E785 Hyperlipidemia, unspecified: Secondary | ICD-10-CM | POA: Diagnosis not present

## 2017-04-01 DIAGNOSIS — R768 Other specified abnormal immunological findings in serum: Secondary | ICD-10-CM | POA: Diagnosis not present

## 2017-04-01 DIAGNOSIS — R1312 Dysphagia, oropharyngeal phase: Secondary | ICD-10-CM | POA: Diagnosis not present

## 2017-04-01 DIAGNOSIS — M549 Dorsalgia, unspecified: Secondary | ICD-10-CM | POA: Diagnosis not present

## 2017-04-01 DIAGNOSIS — I2510 Atherosclerotic heart disease of native coronary artery without angina pectoris: Secondary | ICD-10-CM | POA: Diagnosis not present

## 2017-04-03 DIAGNOSIS — R5383 Other fatigue: Secondary | ICD-10-CM | POA: Diagnosis not present

## 2017-04-03 DIAGNOSIS — E7800 Pure hypercholesterolemia, unspecified: Secondary | ICD-10-CM | POA: Diagnosis not present

## 2017-04-03 DIAGNOSIS — E785 Hyperlipidemia, unspecified: Secondary | ICD-10-CM | POA: Diagnosis not present

## 2017-04-03 DIAGNOSIS — R1312 Dysphagia, oropharyngeal phase: Secondary | ICD-10-CM | POA: Diagnosis not present

## 2017-04-03 DIAGNOSIS — L89154 Pressure ulcer of sacral region, stage 4: Secondary | ICD-10-CM | POA: Diagnosis not present

## 2017-04-03 DIAGNOSIS — K219 Gastro-esophageal reflux disease without esophagitis: Secondary | ICD-10-CM | POA: Diagnosis not present

## 2017-04-03 DIAGNOSIS — D649 Anemia, unspecified: Secondary | ICD-10-CM | POA: Diagnosis not present

## 2017-04-03 DIAGNOSIS — K769 Liver disease, unspecified: Secondary | ICD-10-CM | POA: Diagnosis not present

## 2017-04-03 DIAGNOSIS — Z87891 Personal history of nicotine dependence: Secondary | ICD-10-CM | POA: Diagnosis not present

## 2017-04-03 DIAGNOSIS — M159 Polyosteoarthritis, unspecified: Secondary | ICD-10-CM | POA: Diagnosis not present

## 2017-04-03 DIAGNOSIS — I2510 Atherosclerotic heart disease of native coronary artery without angina pectoris: Secondary | ICD-10-CM | POA: Diagnosis not present

## 2017-04-03 DIAGNOSIS — I10 Essential (primary) hypertension: Secondary | ICD-10-CM | POA: Diagnosis not present

## 2017-04-03 DIAGNOSIS — Z89512 Acquired absence of left leg below knee: Secondary | ICD-10-CM | POA: Diagnosis not present

## 2017-04-03 DIAGNOSIS — M059 Rheumatoid arthritis with rheumatoid factor, unspecified: Secondary | ICD-10-CM | POA: Diagnosis not present

## 2017-04-03 DIAGNOSIS — J439 Emphysema, unspecified: Secondary | ICD-10-CM | POA: Diagnosis not present

## 2017-04-03 DIAGNOSIS — Z9981 Dependence on supplemental oxygen: Secondary | ICD-10-CM | POA: Diagnosis not present

## 2017-04-07 DIAGNOSIS — E785 Hyperlipidemia, unspecified: Secondary | ICD-10-CM | POA: Diagnosis not present

## 2017-04-07 DIAGNOSIS — Z89512 Acquired absence of left leg below knee: Secondary | ICD-10-CM | POA: Diagnosis not present

## 2017-04-07 DIAGNOSIS — J439 Emphysema, unspecified: Secondary | ICD-10-CM | POA: Diagnosis not present

## 2017-04-07 DIAGNOSIS — R1312 Dysphagia, oropharyngeal phase: Secondary | ICD-10-CM | POA: Diagnosis not present

## 2017-04-07 DIAGNOSIS — Z87891 Personal history of nicotine dependence: Secondary | ICD-10-CM | POA: Diagnosis not present

## 2017-04-07 DIAGNOSIS — I2510 Atherosclerotic heart disease of native coronary artery without angina pectoris: Secondary | ICD-10-CM | POA: Diagnosis not present

## 2017-04-07 DIAGNOSIS — L89154 Pressure ulcer of sacral region, stage 4: Secondary | ICD-10-CM | POA: Diagnosis not present

## 2017-04-07 DIAGNOSIS — J449 Chronic obstructive pulmonary disease, unspecified: Secondary | ICD-10-CM | POA: Diagnosis not present

## 2017-04-07 DIAGNOSIS — D649 Anemia, unspecified: Secondary | ICD-10-CM | POA: Diagnosis not present

## 2017-04-07 DIAGNOSIS — M059 Rheumatoid arthritis with rheumatoid factor, unspecified: Secondary | ICD-10-CM | POA: Diagnosis not present

## 2017-04-07 DIAGNOSIS — K769 Liver disease, unspecified: Secondary | ICD-10-CM | POA: Diagnosis not present

## 2017-04-07 DIAGNOSIS — Z9981 Dependence on supplemental oxygen: Secondary | ICD-10-CM | POA: Diagnosis not present

## 2017-04-07 DIAGNOSIS — I10 Essential (primary) hypertension: Secondary | ICD-10-CM | POA: Diagnosis not present

## 2017-04-07 DIAGNOSIS — K219 Gastro-esophageal reflux disease without esophagitis: Secondary | ICD-10-CM | POA: Diagnosis not present

## 2017-04-07 DIAGNOSIS — M159 Polyosteoarthritis, unspecified: Secondary | ICD-10-CM | POA: Diagnosis not present

## 2017-04-08 DIAGNOSIS — L89154 Pressure ulcer of sacral region, stage 4: Secondary | ICD-10-CM | POA: Diagnosis not present

## 2017-04-10 DIAGNOSIS — Z9981 Dependence on supplemental oxygen: Secondary | ICD-10-CM | POA: Diagnosis not present

## 2017-04-10 DIAGNOSIS — I10 Essential (primary) hypertension: Secondary | ICD-10-CM | POA: Diagnosis not present

## 2017-04-10 DIAGNOSIS — K769 Liver disease, unspecified: Secondary | ICD-10-CM | POA: Diagnosis not present

## 2017-04-10 DIAGNOSIS — L89154 Pressure ulcer of sacral region, stage 4: Secondary | ICD-10-CM | POA: Diagnosis not present

## 2017-04-10 DIAGNOSIS — R1312 Dysphagia, oropharyngeal phase: Secondary | ICD-10-CM | POA: Diagnosis not present

## 2017-04-10 DIAGNOSIS — M059 Rheumatoid arthritis with rheumatoid factor, unspecified: Secondary | ICD-10-CM | POA: Diagnosis not present

## 2017-04-10 DIAGNOSIS — M159 Polyosteoarthritis, unspecified: Secondary | ICD-10-CM | POA: Diagnosis not present

## 2017-04-10 DIAGNOSIS — E7800 Pure hypercholesterolemia, unspecified: Secondary | ICD-10-CM | POA: Diagnosis not present

## 2017-04-10 DIAGNOSIS — Z87891 Personal history of nicotine dependence: Secondary | ICD-10-CM | POA: Diagnosis not present

## 2017-04-10 DIAGNOSIS — J439 Emphysema, unspecified: Secondary | ICD-10-CM | POA: Diagnosis not present

## 2017-04-10 DIAGNOSIS — J449 Chronic obstructive pulmonary disease, unspecified: Secondary | ICD-10-CM | POA: Diagnosis not present

## 2017-04-10 DIAGNOSIS — K219 Gastro-esophageal reflux disease without esophagitis: Secondary | ICD-10-CM | POA: Diagnosis not present

## 2017-04-10 DIAGNOSIS — Z89512 Acquired absence of left leg below knee: Secondary | ICD-10-CM | POA: Diagnosis not present

## 2017-04-10 DIAGNOSIS — D649 Anemia, unspecified: Secondary | ICD-10-CM | POA: Diagnosis not present

## 2017-04-10 DIAGNOSIS — I2510 Atherosclerotic heart disease of native coronary artery without angina pectoris: Secondary | ICD-10-CM | POA: Diagnosis not present

## 2017-04-10 DIAGNOSIS — E785 Hyperlipidemia, unspecified: Secondary | ICD-10-CM | POA: Diagnosis not present

## 2017-04-11 DIAGNOSIS — M059 Rheumatoid arthritis with rheumatoid factor, unspecified: Secondary | ICD-10-CM | POA: Diagnosis not present

## 2017-04-11 DIAGNOSIS — E785 Hyperlipidemia, unspecified: Secondary | ICD-10-CM | POA: Diagnosis not present

## 2017-04-11 DIAGNOSIS — R1312 Dysphagia, oropharyngeal phase: Secondary | ICD-10-CM | POA: Diagnosis not present

## 2017-04-11 DIAGNOSIS — Z89512 Acquired absence of left leg below knee: Secondary | ICD-10-CM | POA: Diagnosis not present

## 2017-04-11 DIAGNOSIS — H25812 Combined forms of age-related cataract, left eye: Secondary | ICD-10-CM | POA: Diagnosis not present

## 2017-04-11 DIAGNOSIS — M159 Polyosteoarthritis, unspecified: Secondary | ICD-10-CM | POA: Diagnosis not present

## 2017-04-11 DIAGNOSIS — K769 Liver disease, unspecified: Secondary | ICD-10-CM | POA: Diagnosis not present

## 2017-04-11 DIAGNOSIS — J439 Emphysema, unspecified: Secondary | ICD-10-CM | POA: Diagnosis not present

## 2017-04-11 DIAGNOSIS — I10 Essential (primary) hypertension: Secondary | ICD-10-CM | POA: Diagnosis not present

## 2017-04-11 DIAGNOSIS — K219 Gastro-esophageal reflux disease without esophagitis: Secondary | ICD-10-CM | POA: Diagnosis not present

## 2017-04-11 DIAGNOSIS — L89154 Pressure ulcer of sacral region, stage 4: Secondary | ICD-10-CM | POA: Diagnosis not present

## 2017-04-11 DIAGNOSIS — D649 Anemia, unspecified: Secondary | ICD-10-CM | POA: Diagnosis not present

## 2017-04-11 DIAGNOSIS — I2510 Atherosclerotic heart disease of native coronary artery without angina pectoris: Secondary | ICD-10-CM | POA: Diagnosis not present

## 2017-04-11 DIAGNOSIS — H04123 Dry eye syndrome of bilateral lacrimal glands: Secondary | ICD-10-CM | POA: Diagnosis not present

## 2017-04-11 DIAGNOSIS — Z87891 Personal history of nicotine dependence: Secondary | ICD-10-CM | POA: Diagnosis not present

## 2017-04-11 DIAGNOSIS — Z9981 Dependence on supplemental oxygen: Secondary | ICD-10-CM | POA: Diagnosis not present

## 2017-04-14 DIAGNOSIS — I10 Essential (primary) hypertension: Secondary | ICD-10-CM | POA: Diagnosis not present

## 2017-04-14 DIAGNOSIS — K219 Gastro-esophageal reflux disease without esophagitis: Secondary | ICD-10-CM | POA: Diagnosis not present

## 2017-04-14 DIAGNOSIS — Z89512 Acquired absence of left leg below knee: Secondary | ICD-10-CM | POA: Diagnosis not present

## 2017-04-14 DIAGNOSIS — Z9981 Dependence on supplemental oxygen: Secondary | ICD-10-CM | POA: Diagnosis not present

## 2017-04-14 DIAGNOSIS — J439 Emphysema, unspecified: Secondary | ICD-10-CM | POA: Diagnosis not present

## 2017-04-14 DIAGNOSIS — L89154 Pressure ulcer of sacral region, stage 4: Secondary | ICD-10-CM | POA: Diagnosis not present

## 2017-04-14 DIAGNOSIS — I2510 Atherosclerotic heart disease of native coronary artery without angina pectoris: Secondary | ICD-10-CM | POA: Diagnosis not present

## 2017-04-14 DIAGNOSIS — M059 Rheumatoid arthritis with rheumatoid factor, unspecified: Secondary | ICD-10-CM | POA: Diagnosis not present

## 2017-04-14 DIAGNOSIS — K769 Liver disease, unspecified: Secondary | ICD-10-CM | POA: Diagnosis not present

## 2017-04-14 DIAGNOSIS — D649 Anemia, unspecified: Secondary | ICD-10-CM | POA: Diagnosis not present

## 2017-04-14 DIAGNOSIS — Z87891 Personal history of nicotine dependence: Secondary | ICD-10-CM | POA: Diagnosis not present

## 2017-04-14 DIAGNOSIS — M159 Polyosteoarthritis, unspecified: Secondary | ICD-10-CM | POA: Diagnosis not present

## 2017-04-14 DIAGNOSIS — R1312 Dysphagia, oropharyngeal phase: Secondary | ICD-10-CM | POA: Diagnosis not present

## 2017-04-14 DIAGNOSIS — E785 Hyperlipidemia, unspecified: Secondary | ICD-10-CM | POA: Diagnosis not present

## 2017-04-15 DIAGNOSIS — L89154 Pressure ulcer of sacral region, stage 4: Secondary | ICD-10-CM | POA: Diagnosis not present

## 2017-04-16 DIAGNOSIS — M159 Polyosteoarthritis, unspecified: Secondary | ICD-10-CM | POA: Diagnosis not present

## 2017-04-16 DIAGNOSIS — E7800 Pure hypercholesterolemia, unspecified: Secondary | ICD-10-CM | POA: Diagnosis not present

## 2017-04-16 DIAGNOSIS — J439 Emphysema, unspecified: Secondary | ICD-10-CM | POA: Diagnosis not present

## 2017-04-16 DIAGNOSIS — D649 Anemia, unspecified: Secondary | ICD-10-CM | POA: Diagnosis not present

## 2017-04-16 DIAGNOSIS — M059 Rheumatoid arthritis with rheumatoid factor, unspecified: Secondary | ICD-10-CM | POA: Diagnosis not present

## 2017-04-16 DIAGNOSIS — K219 Gastro-esophageal reflux disease without esophagitis: Secondary | ICD-10-CM | POA: Diagnosis not present

## 2017-04-16 DIAGNOSIS — Z9981 Dependence on supplemental oxygen: Secondary | ICD-10-CM | POA: Diagnosis not present

## 2017-04-16 DIAGNOSIS — I10 Essential (primary) hypertension: Secondary | ICD-10-CM | POA: Diagnosis not present

## 2017-04-16 DIAGNOSIS — Z87891 Personal history of nicotine dependence: Secondary | ICD-10-CM | POA: Diagnosis not present

## 2017-04-16 DIAGNOSIS — E785 Hyperlipidemia, unspecified: Secondary | ICD-10-CM | POA: Diagnosis not present

## 2017-04-16 DIAGNOSIS — K769 Liver disease, unspecified: Secondary | ICD-10-CM | POA: Diagnosis not present

## 2017-04-16 DIAGNOSIS — Z89512 Acquired absence of left leg below knee: Secondary | ICD-10-CM | POA: Diagnosis not present

## 2017-04-16 DIAGNOSIS — I2510 Atherosclerotic heart disease of native coronary artery without angina pectoris: Secondary | ICD-10-CM | POA: Diagnosis not present

## 2017-04-16 DIAGNOSIS — R1312 Dysphagia, oropharyngeal phase: Secondary | ICD-10-CM | POA: Diagnosis not present

## 2017-04-16 DIAGNOSIS — L89154 Pressure ulcer of sacral region, stage 4: Secondary | ICD-10-CM | POA: Diagnosis not present

## 2017-04-17 DIAGNOSIS — D649 Anemia, unspecified: Secondary | ICD-10-CM | POA: Diagnosis not present

## 2017-04-17 DIAGNOSIS — Z87891 Personal history of nicotine dependence: Secondary | ICD-10-CM | POA: Diagnosis not present

## 2017-04-17 DIAGNOSIS — M059 Rheumatoid arthritis with rheumatoid factor, unspecified: Secondary | ICD-10-CM | POA: Diagnosis not present

## 2017-04-17 DIAGNOSIS — Z89512 Acquired absence of left leg below knee: Secondary | ICD-10-CM | POA: Diagnosis not present

## 2017-04-17 DIAGNOSIS — Z9981 Dependence on supplemental oxygen: Secondary | ICD-10-CM | POA: Diagnosis not present

## 2017-04-17 DIAGNOSIS — I2510 Atherosclerotic heart disease of native coronary artery without angina pectoris: Secondary | ICD-10-CM | POA: Diagnosis not present

## 2017-04-17 DIAGNOSIS — K219 Gastro-esophageal reflux disease without esophagitis: Secondary | ICD-10-CM | POA: Diagnosis not present

## 2017-04-17 DIAGNOSIS — K769 Liver disease, unspecified: Secondary | ICD-10-CM | POA: Diagnosis not present

## 2017-04-17 DIAGNOSIS — J439 Emphysema, unspecified: Secondary | ICD-10-CM | POA: Diagnosis not present

## 2017-04-17 DIAGNOSIS — M159 Polyosteoarthritis, unspecified: Secondary | ICD-10-CM | POA: Diagnosis not present

## 2017-04-17 DIAGNOSIS — E785 Hyperlipidemia, unspecified: Secondary | ICD-10-CM | POA: Diagnosis not present

## 2017-04-17 DIAGNOSIS — L89154 Pressure ulcer of sacral region, stage 4: Secondary | ICD-10-CM | POA: Diagnosis not present

## 2017-04-17 DIAGNOSIS — I10 Essential (primary) hypertension: Secondary | ICD-10-CM | POA: Diagnosis not present

## 2017-04-17 DIAGNOSIS — R1312 Dysphagia, oropharyngeal phase: Secondary | ICD-10-CM | POA: Diagnosis not present

## 2017-04-18 DIAGNOSIS — J439 Emphysema, unspecified: Secondary | ICD-10-CM | POA: Diagnosis not present

## 2017-04-18 DIAGNOSIS — R1312 Dysphagia, oropharyngeal phase: Secondary | ICD-10-CM | POA: Diagnosis not present

## 2017-04-18 DIAGNOSIS — E785 Hyperlipidemia, unspecified: Secondary | ICD-10-CM | POA: Diagnosis not present

## 2017-04-18 DIAGNOSIS — M159 Polyosteoarthritis, unspecified: Secondary | ICD-10-CM | POA: Diagnosis not present

## 2017-04-18 DIAGNOSIS — I2510 Atherosclerotic heart disease of native coronary artery without angina pectoris: Secondary | ICD-10-CM | POA: Diagnosis not present

## 2017-04-18 DIAGNOSIS — Z87891 Personal history of nicotine dependence: Secondary | ICD-10-CM | POA: Diagnosis not present

## 2017-04-18 DIAGNOSIS — D649 Anemia, unspecified: Secondary | ICD-10-CM | POA: Diagnosis not present

## 2017-04-18 DIAGNOSIS — Z89512 Acquired absence of left leg below knee: Secondary | ICD-10-CM | POA: Diagnosis not present

## 2017-04-18 DIAGNOSIS — Z9981 Dependence on supplemental oxygen: Secondary | ICD-10-CM | POA: Diagnosis not present

## 2017-04-18 DIAGNOSIS — K769 Liver disease, unspecified: Secondary | ICD-10-CM | POA: Diagnosis not present

## 2017-04-18 DIAGNOSIS — K219 Gastro-esophageal reflux disease without esophagitis: Secondary | ICD-10-CM | POA: Diagnosis not present

## 2017-04-18 DIAGNOSIS — L89154 Pressure ulcer of sacral region, stage 4: Secondary | ICD-10-CM | POA: Diagnosis not present

## 2017-04-18 DIAGNOSIS — M059 Rheumatoid arthritis with rheumatoid factor, unspecified: Secondary | ICD-10-CM | POA: Diagnosis not present

## 2017-04-18 DIAGNOSIS — I10 Essential (primary) hypertension: Secondary | ICD-10-CM | POA: Diagnosis not present

## 2017-04-21 DIAGNOSIS — L89154 Pressure ulcer of sacral region, stage 4: Secondary | ICD-10-CM | POA: Diagnosis not present

## 2017-04-22 DIAGNOSIS — H25812 Combined forms of age-related cataract, left eye: Secondary | ICD-10-CM | POA: Diagnosis not present

## 2017-04-22 DIAGNOSIS — M1990 Unspecified osteoarthritis, unspecified site: Secondary | ICD-10-CM | POA: Diagnosis not present

## 2017-04-22 DIAGNOSIS — H259 Unspecified age-related cataract: Secondary | ICD-10-CM | POA: Diagnosis not present

## 2017-04-22 DIAGNOSIS — K449 Diaphragmatic hernia without obstruction or gangrene: Secondary | ICD-10-CM | POA: Diagnosis not present

## 2017-04-22 DIAGNOSIS — Z8673 Personal history of transient ischemic attack (TIA), and cerebral infarction without residual deficits: Secondary | ICD-10-CM | POA: Diagnosis not present

## 2017-04-22 DIAGNOSIS — Z95818 Presence of other cardiac implants and grafts: Secondary | ICD-10-CM | POA: Diagnosis not present

## 2017-04-22 DIAGNOSIS — H2512 Age-related nuclear cataract, left eye: Secondary | ICD-10-CM | POA: Diagnosis not present

## 2017-04-22 DIAGNOSIS — J449 Chronic obstructive pulmonary disease, unspecified: Secondary | ICD-10-CM | POA: Diagnosis not present

## 2017-04-22 DIAGNOSIS — I252 Old myocardial infarction: Secondary | ICD-10-CM | POA: Diagnosis not present

## 2017-04-23 DIAGNOSIS — E785 Hyperlipidemia, unspecified: Secondary | ICD-10-CM | POA: Diagnosis not present

## 2017-04-23 DIAGNOSIS — L89154 Pressure ulcer of sacral region, stage 4: Secondary | ICD-10-CM | POA: Diagnosis not present

## 2017-04-23 DIAGNOSIS — Z89512 Acquired absence of left leg below knee: Secondary | ICD-10-CM | POA: Diagnosis not present

## 2017-04-23 DIAGNOSIS — I2510 Atherosclerotic heart disease of native coronary artery without angina pectoris: Secondary | ICD-10-CM | POA: Diagnosis not present

## 2017-04-23 DIAGNOSIS — K769 Liver disease, unspecified: Secondary | ICD-10-CM | POA: Diagnosis not present

## 2017-04-23 DIAGNOSIS — D649 Anemia, unspecified: Secondary | ICD-10-CM | POA: Diagnosis not present

## 2017-04-23 DIAGNOSIS — J439 Emphysema, unspecified: Secondary | ICD-10-CM | POA: Diagnosis not present

## 2017-04-23 DIAGNOSIS — M159 Polyosteoarthritis, unspecified: Secondary | ICD-10-CM | POA: Diagnosis not present

## 2017-04-23 DIAGNOSIS — M059 Rheumatoid arthritis with rheumatoid factor, unspecified: Secondary | ICD-10-CM | POA: Diagnosis not present

## 2017-04-23 DIAGNOSIS — I10 Essential (primary) hypertension: Secondary | ICD-10-CM | POA: Diagnosis not present

## 2017-04-23 DIAGNOSIS — R1312 Dysphagia, oropharyngeal phase: Secondary | ICD-10-CM | POA: Diagnosis not present

## 2017-04-23 DIAGNOSIS — K219 Gastro-esophageal reflux disease without esophagitis: Secondary | ICD-10-CM | POA: Diagnosis not present

## 2017-04-23 DIAGNOSIS — Z87891 Personal history of nicotine dependence: Secondary | ICD-10-CM | POA: Diagnosis not present

## 2017-04-23 DIAGNOSIS — Z9981 Dependence on supplemental oxygen: Secondary | ICD-10-CM | POA: Diagnosis not present

## 2017-04-25 DIAGNOSIS — K219 Gastro-esophageal reflux disease without esophagitis: Secondary | ICD-10-CM | POA: Diagnosis not present

## 2017-04-25 DIAGNOSIS — I10 Essential (primary) hypertension: Secondary | ICD-10-CM | POA: Diagnosis not present

## 2017-04-25 DIAGNOSIS — Z9981 Dependence on supplemental oxygen: Secondary | ICD-10-CM | POA: Diagnosis not present

## 2017-04-25 DIAGNOSIS — M159 Polyosteoarthritis, unspecified: Secondary | ICD-10-CM | POA: Diagnosis not present

## 2017-04-25 DIAGNOSIS — Z89512 Acquired absence of left leg below knee: Secondary | ICD-10-CM | POA: Diagnosis not present

## 2017-04-25 DIAGNOSIS — J439 Emphysema, unspecified: Secondary | ICD-10-CM | POA: Diagnosis not present

## 2017-04-25 DIAGNOSIS — I2510 Atherosclerotic heart disease of native coronary artery without angina pectoris: Secondary | ICD-10-CM | POA: Diagnosis not present

## 2017-04-25 DIAGNOSIS — L89154 Pressure ulcer of sacral region, stage 4: Secondary | ICD-10-CM | POA: Diagnosis not present

## 2017-04-25 DIAGNOSIS — Z87891 Personal history of nicotine dependence: Secondary | ICD-10-CM | POA: Diagnosis not present

## 2017-04-25 DIAGNOSIS — D649 Anemia, unspecified: Secondary | ICD-10-CM | POA: Diagnosis not present

## 2017-04-25 DIAGNOSIS — M059 Rheumatoid arthritis with rheumatoid factor, unspecified: Secondary | ICD-10-CM | POA: Diagnosis not present

## 2017-04-25 DIAGNOSIS — R1312 Dysphagia, oropharyngeal phase: Secondary | ICD-10-CM | POA: Diagnosis not present

## 2017-04-25 DIAGNOSIS — E785 Hyperlipidemia, unspecified: Secondary | ICD-10-CM | POA: Diagnosis not present

## 2017-04-25 DIAGNOSIS — K769 Liver disease, unspecified: Secondary | ICD-10-CM | POA: Diagnosis not present

## 2017-04-28 DIAGNOSIS — K769 Liver disease, unspecified: Secondary | ICD-10-CM | POA: Diagnosis not present

## 2017-04-28 DIAGNOSIS — Z89512 Acquired absence of left leg below knee: Secondary | ICD-10-CM | POA: Diagnosis not present

## 2017-04-28 DIAGNOSIS — Z87891 Personal history of nicotine dependence: Secondary | ICD-10-CM | POA: Diagnosis not present

## 2017-04-28 DIAGNOSIS — E785 Hyperlipidemia, unspecified: Secondary | ICD-10-CM | POA: Diagnosis not present

## 2017-04-28 DIAGNOSIS — R1312 Dysphagia, oropharyngeal phase: Secondary | ICD-10-CM | POA: Diagnosis not present

## 2017-04-28 DIAGNOSIS — Z9981 Dependence on supplemental oxygen: Secondary | ICD-10-CM | POA: Diagnosis not present

## 2017-04-28 DIAGNOSIS — M159 Polyosteoarthritis, unspecified: Secondary | ICD-10-CM | POA: Diagnosis not present

## 2017-04-28 DIAGNOSIS — I10 Essential (primary) hypertension: Secondary | ICD-10-CM | POA: Diagnosis not present

## 2017-04-28 DIAGNOSIS — M059 Rheumatoid arthritis with rheumatoid factor, unspecified: Secondary | ICD-10-CM | POA: Diagnosis not present

## 2017-04-28 DIAGNOSIS — L89154 Pressure ulcer of sacral region, stage 4: Secondary | ICD-10-CM | POA: Diagnosis not present

## 2017-04-28 DIAGNOSIS — D649 Anemia, unspecified: Secondary | ICD-10-CM | POA: Diagnosis not present

## 2017-04-28 DIAGNOSIS — K219 Gastro-esophageal reflux disease without esophagitis: Secondary | ICD-10-CM | POA: Diagnosis not present

## 2017-04-28 DIAGNOSIS — I2510 Atherosclerotic heart disease of native coronary artery without angina pectoris: Secondary | ICD-10-CM | POA: Diagnosis not present

## 2017-04-28 DIAGNOSIS — J439 Emphysema, unspecified: Secondary | ICD-10-CM | POA: Diagnosis not present

## 2017-04-29 DIAGNOSIS — E039 Hypothyroidism, unspecified: Secondary | ICD-10-CM | POA: Diagnosis not present

## 2017-04-29 DIAGNOSIS — H04123 Dry eye syndrome of bilateral lacrimal glands: Secondary | ICD-10-CM | POA: Diagnosis not present

## 2017-04-30 DIAGNOSIS — R1312 Dysphagia, oropharyngeal phase: Secondary | ICD-10-CM | POA: Diagnosis not present

## 2017-04-30 DIAGNOSIS — K769 Liver disease, unspecified: Secondary | ICD-10-CM | POA: Diagnosis not present

## 2017-04-30 DIAGNOSIS — Z87891 Personal history of nicotine dependence: Secondary | ICD-10-CM | POA: Diagnosis not present

## 2017-04-30 DIAGNOSIS — Z9981 Dependence on supplemental oxygen: Secondary | ICD-10-CM | POA: Diagnosis not present

## 2017-04-30 DIAGNOSIS — E785 Hyperlipidemia, unspecified: Secondary | ICD-10-CM | POA: Diagnosis not present

## 2017-04-30 DIAGNOSIS — M059 Rheumatoid arthritis with rheumatoid factor, unspecified: Secondary | ICD-10-CM | POA: Diagnosis not present

## 2017-04-30 DIAGNOSIS — I10 Essential (primary) hypertension: Secondary | ICD-10-CM | POA: Diagnosis not present

## 2017-04-30 DIAGNOSIS — Z89512 Acquired absence of left leg below knee: Secondary | ICD-10-CM | POA: Diagnosis not present

## 2017-04-30 DIAGNOSIS — J439 Emphysema, unspecified: Secondary | ICD-10-CM | POA: Diagnosis not present

## 2017-04-30 DIAGNOSIS — I2510 Atherosclerotic heart disease of native coronary artery without angina pectoris: Secondary | ICD-10-CM | POA: Diagnosis not present

## 2017-04-30 DIAGNOSIS — M159 Polyosteoarthritis, unspecified: Secondary | ICD-10-CM | POA: Diagnosis not present

## 2017-04-30 DIAGNOSIS — K219 Gastro-esophageal reflux disease without esophagitis: Secondary | ICD-10-CM | POA: Diagnosis not present

## 2017-04-30 DIAGNOSIS — L89154 Pressure ulcer of sacral region, stage 4: Secondary | ICD-10-CM | POA: Diagnosis not present

## 2017-04-30 DIAGNOSIS — D649 Anemia, unspecified: Secondary | ICD-10-CM | POA: Diagnosis not present

## 2017-05-05 DIAGNOSIS — M159 Polyosteoarthritis, unspecified: Secondary | ICD-10-CM | POA: Diagnosis not present

## 2017-05-05 DIAGNOSIS — L89154 Pressure ulcer of sacral region, stage 4: Secondary | ICD-10-CM | POA: Diagnosis not present

## 2017-05-05 DIAGNOSIS — J439 Emphysema, unspecified: Secondary | ICD-10-CM | POA: Diagnosis not present

## 2017-05-05 DIAGNOSIS — I2510 Atherosclerotic heart disease of native coronary artery without angina pectoris: Secondary | ICD-10-CM | POA: Diagnosis not present

## 2017-05-05 DIAGNOSIS — D649 Anemia, unspecified: Secondary | ICD-10-CM | POA: Diagnosis not present

## 2017-05-05 DIAGNOSIS — K219 Gastro-esophageal reflux disease without esophagitis: Secondary | ICD-10-CM | POA: Diagnosis not present

## 2017-05-05 DIAGNOSIS — E785 Hyperlipidemia, unspecified: Secondary | ICD-10-CM | POA: Diagnosis not present

## 2017-05-05 DIAGNOSIS — R1312 Dysphagia, oropharyngeal phase: Secondary | ICD-10-CM | POA: Diagnosis not present

## 2017-05-05 DIAGNOSIS — Z89512 Acquired absence of left leg below knee: Secondary | ICD-10-CM | POA: Diagnosis not present

## 2017-05-05 DIAGNOSIS — M059 Rheumatoid arthritis with rheumatoid factor, unspecified: Secondary | ICD-10-CM | POA: Diagnosis not present

## 2017-05-05 DIAGNOSIS — K769 Liver disease, unspecified: Secondary | ICD-10-CM | POA: Diagnosis not present

## 2017-05-05 DIAGNOSIS — Z9981 Dependence on supplemental oxygen: Secondary | ICD-10-CM | POA: Diagnosis not present

## 2017-05-05 DIAGNOSIS — I10 Essential (primary) hypertension: Secondary | ICD-10-CM | POA: Diagnosis not present

## 2017-05-05 DIAGNOSIS — Z87891 Personal history of nicotine dependence: Secondary | ICD-10-CM | POA: Diagnosis not present

## 2017-05-06 DIAGNOSIS — Z9981 Dependence on supplemental oxygen: Secondary | ICD-10-CM | POA: Diagnosis not present

## 2017-05-06 DIAGNOSIS — K449 Diaphragmatic hernia without obstruction or gangrene: Secondary | ICD-10-CM | POA: Diagnosis not present

## 2017-05-06 DIAGNOSIS — H25811 Combined forms of age-related cataract, right eye: Secondary | ICD-10-CM | POA: Diagnosis not present

## 2017-05-06 DIAGNOSIS — Z8673 Personal history of transient ischemic attack (TIA), and cerebral infarction without residual deficits: Secondary | ICD-10-CM | POA: Diagnosis not present

## 2017-05-06 DIAGNOSIS — H259 Unspecified age-related cataract: Secondary | ICD-10-CM | POA: Diagnosis not present

## 2017-05-06 DIAGNOSIS — J449 Chronic obstructive pulmonary disease, unspecified: Secondary | ICD-10-CM | POA: Diagnosis not present

## 2017-05-06 DIAGNOSIS — Z87891 Personal history of nicotine dependence: Secondary | ICD-10-CM | POA: Diagnosis not present

## 2017-05-06 DIAGNOSIS — Z79899 Other long term (current) drug therapy: Secondary | ICD-10-CM | POA: Diagnosis not present

## 2017-05-06 DIAGNOSIS — I252 Old myocardial infarction: Secondary | ICD-10-CM | POA: Diagnosis not present

## 2017-05-06 DIAGNOSIS — Z955 Presence of coronary angioplasty implant and graft: Secondary | ICD-10-CM | POA: Diagnosis not present

## 2017-05-06 DIAGNOSIS — E039 Hypothyroidism, unspecified: Secondary | ICD-10-CM | POA: Diagnosis not present

## 2017-05-06 DIAGNOSIS — Z79891 Long term (current) use of opiate analgesic: Secondary | ICD-10-CM | POA: Diagnosis not present

## 2017-05-08 DIAGNOSIS — J449 Chronic obstructive pulmonary disease, unspecified: Secondary | ICD-10-CM | POA: Diagnosis not present

## 2017-05-09 DIAGNOSIS — L89154 Pressure ulcer of sacral region, stage 4: Secondary | ICD-10-CM | POA: Diagnosis not present

## 2017-05-12 DIAGNOSIS — E785 Hyperlipidemia, unspecified: Secondary | ICD-10-CM | POA: Diagnosis not present

## 2017-05-12 DIAGNOSIS — K219 Gastro-esophageal reflux disease without esophagitis: Secondary | ICD-10-CM | POA: Diagnosis not present

## 2017-05-12 DIAGNOSIS — J439 Emphysema, unspecified: Secondary | ICD-10-CM | POA: Diagnosis not present

## 2017-05-12 DIAGNOSIS — Z89512 Acquired absence of left leg below knee: Secondary | ICD-10-CM | POA: Diagnosis not present

## 2017-05-12 DIAGNOSIS — I10 Essential (primary) hypertension: Secondary | ICD-10-CM | POA: Diagnosis not present

## 2017-05-12 DIAGNOSIS — Z87891 Personal history of nicotine dependence: Secondary | ICD-10-CM | POA: Diagnosis not present

## 2017-05-12 DIAGNOSIS — R1312 Dysphagia, oropharyngeal phase: Secondary | ICD-10-CM | POA: Diagnosis not present

## 2017-05-12 DIAGNOSIS — L89154 Pressure ulcer of sacral region, stage 4: Secondary | ICD-10-CM | POA: Diagnosis not present

## 2017-05-12 DIAGNOSIS — M059 Rheumatoid arthritis with rheumatoid factor, unspecified: Secondary | ICD-10-CM | POA: Diagnosis not present

## 2017-05-12 DIAGNOSIS — K769 Liver disease, unspecified: Secondary | ICD-10-CM | POA: Diagnosis not present

## 2017-05-12 DIAGNOSIS — M159 Polyosteoarthritis, unspecified: Secondary | ICD-10-CM | POA: Diagnosis not present

## 2017-05-12 DIAGNOSIS — I2510 Atherosclerotic heart disease of native coronary artery without angina pectoris: Secondary | ICD-10-CM | POA: Diagnosis not present

## 2017-05-12 DIAGNOSIS — D649 Anemia, unspecified: Secondary | ICD-10-CM | POA: Diagnosis not present

## 2017-05-12 DIAGNOSIS — Z9981 Dependence on supplemental oxygen: Secondary | ICD-10-CM | POA: Diagnosis not present

## 2017-05-14 DIAGNOSIS — K769 Liver disease, unspecified: Secondary | ICD-10-CM | POA: Diagnosis not present

## 2017-05-14 DIAGNOSIS — J439 Emphysema, unspecified: Secondary | ICD-10-CM | POA: Diagnosis not present

## 2017-05-14 DIAGNOSIS — I10 Essential (primary) hypertension: Secondary | ICD-10-CM | POA: Diagnosis not present

## 2017-05-14 DIAGNOSIS — K219 Gastro-esophageal reflux disease without esophagitis: Secondary | ICD-10-CM | POA: Diagnosis not present

## 2017-05-14 DIAGNOSIS — M159 Polyosteoarthritis, unspecified: Secondary | ICD-10-CM | POA: Diagnosis not present

## 2017-05-14 DIAGNOSIS — E785 Hyperlipidemia, unspecified: Secondary | ICD-10-CM | POA: Diagnosis not present

## 2017-05-14 DIAGNOSIS — D649 Anemia, unspecified: Secondary | ICD-10-CM | POA: Diagnosis not present

## 2017-05-14 DIAGNOSIS — I2510 Atherosclerotic heart disease of native coronary artery without angina pectoris: Secondary | ICD-10-CM | POA: Diagnosis not present

## 2017-05-14 DIAGNOSIS — Z9981 Dependence on supplemental oxygen: Secondary | ICD-10-CM | POA: Diagnosis not present

## 2017-05-14 DIAGNOSIS — Z89512 Acquired absence of left leg below knee: Secondary | ICD-10-CM | POA: Diagnosis not present

## 2017-05-14 DIAGNOSIS — L89154 Pressure ulcer of sacral region, stage 4: Secondary | ICD-10-CM | POA: Diagnosis not present

## 2017-05-14 DIAGNOSIS — Z87891 Personal history of nicotine dependence: Secondary | ICD-10-CM | POA: Diagnosis not present

## 2017-05-14 DIAGNOSIS — L89159 Pressure ulcer of sacral region, unspecified stage: Secondary | ICD-10-CM | POA: Diagnosis not present

## 2017-05-14 DIAGNOSIS — R1312 Dysphagia, oropharyngeal phase: Secondary | ICD-10-CM | POA: Diagnosis not present

## 2017-05-14 DIAGNOSIS — M059 Rheumatoid arthritis with rheumatoid factor, unspecified: Secondary | ICD-10-CM | POA: Diagnosis not present

## 2017-05-16 DIAGNOSIS — K219 Gastro-esophageal reflux disease without esophagitis: Secondary | ICD-10-CM | POA: Diagnosis not present

## 2017-05-16 DIAGNOSIS — D72819 Decreased white blood cell count, unspecified: Secondary | ICD-10-CM | POA: Diagnosis not present

## 2017-05-16 DIAGNOSIS — E7800 Pure hypercholesterolemia, unspecified: Secondary | ICD-10-CM | POA: Diagnosis not present

## 2017-05-16 DIAGNOSIS — I10 Essential (primary) hypertension: Secondary | ICD-10-CM | POA: Diagnosis not present

## 2017-05-16 DIAGNOSIS — L89154 Pressure ulcer of sacral region, stage 4: Secondary | ICD-10-CM | POA: Diagnosis not present

## 2017-05-26 DIAGNOSIS — E785 Hyperlipidemia, unspecified: Secondary | ICD-10-CM | POA: Diagnosis not present

## 2017-05-26 DIAGNOSIS — Z9981 Dependence on supplemental oxygen: Secondary | ICD-10-CM | POA: Diagnosis not present

## 2017-05-26 DIAGNOSIS — K219 Gastro-esophageal reflux disease without esophagitis: Secondary | ICD-10-CM | POA: Diagnosis not present

## 2017-05-26 DIAGNOSIS — L89154 Pressure ulcer of sacral region, stage 4: Secondary | ICD-10-CM | POA: Diagnosis not present

## 2017-05-26 DIAGNOSIS — K769 Liver disease, unspecified: Secondary | ICD-10-CM | POA: Diagnosis not present

## 2017-05-26 DIAGNOSIS — M159 Polyosteoarthritis, unspecified: Secondary | ICD-10-CM | POA: Diagnosis not present

## 2017-05-26 DIAGNOSIS — J439 Emphysema, unspecified: Secondary | ICD-10-CM | POA: Diagnosis not present

## 2017-05-26 DIAGNOSIS — Z89512 Acquired absence of left leg below knee: Secondary | ICD-10-CM | POA: Diagnosis not present

## 2017-05-26 DIAGNOSIS — I10 Essential (primary) hypertension: Secondary | ICD-10-CM | POA: Diagnosis not present

## 2017-05-26 DIAGNOSIS — R1312 Dysphagia, oropharyngeal phase: Secondary | ICD-10-CM | POA: Diagnosis not present

## 2017-05-26 DIAGNOSIS — Z87891 Personal history of nicotine dependence: Secondary | ICD-10-CM | POA: Diagnosis not present

## 2017-05-26 DIAGNOSIS — I2510 Atherosclerotic heart disease of native coronary artery without angina pectoris: Secondary | ICD-10-CM | POA: Diagnosis not present

## 2017-05-26 DIAGNOSIS — M059 Rheumatoid arthritis with rheumatoid factor, unspecified: Secondary | ICD-10-CM | POA: Diagnosis not present

## 2017-05-26 DIAGNOSIS — D649 Anemia, unspecified: Secondary | ICD-10-CM | POA: Diagnosis not present

## 2017-05-28 DIAGNOSIS — D649 Anemia, unspecified: Secondary | ICD-10-CM | POA: Diagnosis not present

## 2017-05-28 DIAGNOSIS — J439 Emphysema, unspecified: Secondary | ICD-10-CM | POA: Diagnosis not present

## 2017-05-28 DIAGNOSIS — M059 Rheumatoid arthritis with rheumatoid factor, unspecified: Secondary | ICD-10-CM | POA: Diagnosis not present

## 2017-05-28 DIAGNOSIS — R1312 Dysphagia, oropharyngeal phase: Secondary | ICD-10-CM | POA: Diagnosis not present

## 2017-05-28 DIAGNOSIS — E785 Hyperlipidemia, unspecified: Secondary | ICD-10-CM | POA: Diagnosis not present

## 2017-05-28 DIAGNOSIS — L89154 Pressure ulcer of sacral region, stage 4: Secondary | ICD-10-CM | POA: Diagnosis not present

## 2017-05-28 DIAGNOSIS — K769 Liver disease, unspecified: Secondary | ICD-10-CM | POA: Diagnosis not present

## 2017-05-28 DIAGNOSIS — I2510 Atherosclerotic heart disease of native coronary artery without angina pectoris: Secondary | ICD-10-CM | POA: Diagnosis not present

## 2017-05-28 DIAGNOSIS — I10 Essential (primary) hypertension: Secondary | ICD-10-CM | POA: Diagnosis not present

## 2017-05-28 DIAGNOSIS — K219 Gastro-esophageal reflux disease without esophagitis: Secondary | ICD-10-CM | POA: Diagnosis not present

## 2017-05-28 DIAGNOSIS — Z87891 Personal history of nicotine dependence: Secondary | ICD-10-CM | POA: Diagnosis not present

## 2017-05-28 DIAGNOSIS — Z9981 Dependence on supplemental oxygen: Secondary | ICD-10-CM | POA: Diagnosis not present

## 2017-05-28 DIAGNOSIS — M159 Polyosteoarthritis, unspecified: Secondary | ICD-10-CM | POA: Diagnosis not present

## 2017-05-28 DIAGNOSIS — Z89512 Acquired absence of left leg below knee: Secondary | ICD-10-CM | POA: Diagnosis not present

## 2017-05-29 DIAGNOSIS — E039 Hypothyroidism, unspecified: Secondary | ICD-10-CM | POA: Diagnosis not present

## 2017-05-29 DIAGNOSIS — J449 Chronic obstructive pulmonary disease, unspecified: Secondary | ICD-10-CM | POA: Diagnosis not present

## 2017-05-29 DIAGNOSIS — J209 Acute bronchitis, unspecified: Secondary | ICD-10-CM | POA: Diagnosis not present

## 2017-05-30 DIAGNOSIS — L89154 Pressure ulcer of sacral region, stage 4: Secondary | ICD-10-CM | POA: Diagnosis not present

## 2017-06-04 DIAGNOSIS — D649 Anemia, unspecified: Secondary | ICD-10-CM | POA: Diagnosis not present

## 2017-06-04 DIAGNOSIS — M059 Rheumatoid arthritis with rheumatoid factor, unspecified: Secondary | ICD-10-CM | POA: Diagnosis not present

## 2017-06-04 DIAGNOSIS — L89154 Pressure ulcer of sacral region, stage 4: Secondary | ICD-10-CM | POA: Diagnosis not present

## 2017-06-04 DIAGNOSIS — M159 Polyosteoarthritis, unspecified: Secondary | ICD-10-CM | POA: Diagnosis not present

## 2017-06-04 DIAGNOSIS — R1312 Dysphagia, oropharyngeal phase: Secondary | ICD-10-CM | POA: Diagnosis not present

## 2017-06-04 DIAGNOSIS — J439 Emphysema, unspecified: Secondary | ICD-10-CM | POA: Diagnosis not present

## 2017-06-04 DIAGNOSIS — I2510 Atherosclerotic heart disease of native coronary artery without angina pectoris: Secondary | ICD-10-CM | POA: Diagnosis not present

## 2017-06-04 DIAGNOSIS — E785 Hyperlipidemia, unspecified: Secondary | ICD-10-CM | POA: Diagnosis not present

## 2017-06-04 DIAGNOSIS — Z87891 Personal history of nicotine dependence: Secondary | ICD-10-CM | POA: Diagnosis not present

## 2017-06-04 DIAGNOSIS — I361 Nonrheumatic tricuspid (valve) insufficiency: Secondary | ICD-10-CM

## 2017-06-04 DIAGNOSIS — I10 Essential (primary) hypertension: Secondary | ICD-10-CM | POA: Diagnosis not present

## 2017-06-04 DIAGNOSIS — Z9981 Dependence on supplemental oxygen: Secondary | ICD-10-CM | POA: Diagnosis not present

## 2017-06-04 DIAGNOSIS — K769 Liver disease, unspecified: Secondary | ICD-10-CM | POA: Diagnosis not present

## 2017-06-04 DIAGNOSIS — K219 Gastro-esophageal reflux disease without esophagitis: Secondary | ICD-10-CM | POA: Diagnosis not present

## 2017-06-04 DIAGNOSIS — Z89512 Acquired absence of left leg below knee: Secondary | ICD-10-CM | POA: Diagnosis not present

## 2017-06-04 DIAGNOSIS — R9431 Abnormal electrocardiogram [ECG] [EKG]: Secondary | ICD-10-CM

## 2017-06-06 DIAGNOSIS — D649 Anemia, unspecified: Secondary | ICD-10-CM | POA: Diagnosis not present

## 2017-06-06 DIAGNOSIS — Z9981 Dependence on supplemental oxygen: Secondary | ICD-10-CM | POA: Diagnosis not present

## 2017-06-06 DIAGNOSIS — I10 Essential (primary) hypertension: Secondary | ICD-10-CM | POA: Diagnosis not present

## 2017-06-06 DIAGNOSIS — M159 Polyosteoarthritis, unspecified: Secondary | ICD-10-CM | POA: Diagnosis not present

## 2017-06-06 DIAGNOSIS — J439 Emphysema, unspecified: Secondary | ICD-10-CM | POA: Diagnosis not present

## 2017-06-06 DIAGNOSIS — I2510 Atherosclerotic heart disease of native coronary artery without angina pectoris: Secondary | ICD-10-CM | POA: Diagnosis not present

## 2017-06-06 DIAGNOSIS — M059 Rheumatoid arthritis with rheumatoid factor, unspecified: Secondary | ICD-10-CM | POA: Diagnosis not present

## 2017-06-06 DIAGNOSIS — K769 Liver disease, unspecified: Secondary | ICD-10-CM | POA: Diagnosis not present

## 2017-06-06 DIAGNOSIS — R1312 Dysphagia, oropharyngeal phase: Secondary | ICD-10-CM | POA: Diagnosis not present

## 2017-06-06 DIAGNOSIS — Z87891 Personal history of nicotine dependence: Secondary | ICD-10-CM | POA: Diagnosis not present

## 2017-06-06 DIAGNOSIS — Z89512 Acquired absence of left leg below knee: Secondary | ICD-10-CM | POA: Diagnosis not present

## 2017-06-06 DIAGNOSIS — L89154 Pressure ulcer of sacral region, stage 4: Secondary | ICD-10-CM | POA: Diagnosis not present

## 2017-06-06 DIAGNOSIS — K219 Gastro-esophageal reflux disease without esophagitis: Secondary | ICD-10-CM | POA: Diagnosis not present

## 2017-06-06 DIAGNOSIS — E785 Hyperlipidemia, unspecified: Secondary | ICD-10-CM | POA: Diagnosis not present

## 2017-06-07 DIAGNOSIS — J449 Chronic obstructive pulmonary disease, unspecified: Secondary | ICD-10-CM | POA: Diagnosis not present

## 2017-06-09 DIAGNOSIS — M159 Polyosteoarthritis, unspecified: Secondary | ICD-10-CM | POA: Diagnosis not present

## 2017-06-09 DIAGNOSIS — K219 Gastro-esophageal reflux disease without esophagitis: Secondary | ICD-10-CM | POA: Diagnosis not present

## 2017-06-09 DIAGNOSIS — I2510 Atherosclerotic heart disease of native coronary artery without angina pectoris: Secondary | ICD-10-CM | POA: Diagnosis not present

## 2017-06-09 DIAGNOSIS — D649 Anemia, unspecified: Secondary | ICD-10-CM | POA: Diagnosis not present

## 2017-06-09 DIAGNOSIS — E785 Hyperlipidemia, unspecified: Secondary | ICD-10-CM | POA: Diagnosis not present

## 2017-06-09 DIAGNOSIS — M059 Rheumatoid arthritis with rheumatoid factor, unspecified: Secondary | ICD-10-CM | POA: Diagnosis not present

## 2017-06-09 DIAGNOSIS — Z87891 Personal history of nicotine dependence: Secondary | ICD-10-CM | POA: Diagnosis not present

## 2017-06-09 DIAGNOSIS — Z89512 Acquired absence of left leg below knee: Secondary | ICD-10-CM | POA: Diagnosis not present

## 2017-06-09 DIAGNOSIS — J439 Emphysema, unspecified: Secondary | ICD-10-CM | POA: Diagnosis not present

## 2017-06-09 DIAGNOSIS — Z9981 Dependence on supplemental oxygen: Secondary | ICD-10-CM | POA: Diagnosis not present

## 2017-06-09 DIAGNOSIS — R1312 Dysphagia, oropharyngeal phase: Secondary | ICD-10-CM | POA: Diagnosis not present

## 2017-06-09 DIAGNOSIS — K769 Liver disease, unspecified: Secondary | ICD-10-CM | POA: Diagnosis not present

## 2017-06-09 DIAGNOSIS — L89154 Pressure ulcer of sacral region, stage 4: Secondary | ICD-10-CM | POA: Diagnosis not present

## 2017-06-09 DIAGNOSIS — I10 Essential (primary) hypertension: Secondary | ICD-10-CM | POA: Diagnosis not present

## 2017-06-13 DIAGNOSIS — K769 Liver disease, unspecified: Secondary | ICD-10-CM | POA: Diagnosis not present

## 2017-06-13 DIAGNOSIS — E785 Hyperlipidemia, unspecified: Secondary | ICD-10-CM | POA: Diagnosis not present

## 2017-06-13 DIAGNOSIS — L89154 Pressure ulcer of sacral region, stage 4: Secondary | ICD-10-CM | POA: Diagnosis not present

## 2017-06-13 DIAGNOSIS — M159 Polyosteoarthritis, unspecified: Secondary | ICD-10-CM | POA: Diagnosis not present

## 2017-06-13 DIAGNOSIS — I10 Essential (primary) hypertension: Secondary | ICD-10-CM | POA: Diagnosis not present

## 2017-06-13 DIAGNOSIS — R1312 Dysphagia, oropharyngeal phase: Secondary | ICD-10-CM | POA: Diagnosis not present

## 2017-06-13 DIAGNOSIS — J439 Emphysema, unspecified: Secondary | ICD-10-CM | POA: Diagnosis not present

## 2017-06-13 DIAGNOSIS — Z9981 Dependence on supplemental oxygen: Secondary | ICD-10-CM | POA: Diagnosis not present

## 2017-06-13 DIAGNOSIS — K219 Gastro-esophageal reflux disease without esophagitis: Secondary | ICD-10-CM | POA: Diagnosis not present

## 2017-06-13 DIAGNOSIS — D649 Anemia, unspecified: Secondary | ICD-10-CM | POA: Diagnosis not present

## 2017-06-13 DIAGNOSIS — I2510 Atherosclerotic heart disease of native coronary artery without angina pectoris: Secondary | ICD-10-CM | POA: Diagnosis not present

## 2017-06-13 DIAGNOSIS — Z89512 Acquired absence of left leg below knee: Secondary | ICD-10-CM | POA: Diagnosis not present

## 2017-06-13 DIAGNOSIS — Z87891 Personal history of nicotine dependence: Secondary | ICD-10-CM | POA: Diagnosis not present

## 2017-06-13 DIAGNOSIS — M059 Rheumatoid arthritis with rheumatoid factor, unspecified: Secondary | ICD-10-CM | POA: Diagnosis not present

## 2017-06-14 DIAGNOSIS — L89154 Pressure ulcer of sacral region, stage 4: Secondary | ICD-10-CM | POA: Diagnosis not present

## 2017-06-16 DIAGNOSIS — K769 Liver disease, unspecified: Secondary | ICD-10-CM | POA: Diagnosis not present

## 2017-06-16 DIAGNOSIS — Z87891 Personal history of nicotine dependence: Secondary | ICD-10-CM | POA: Diagnosis not present

## 2017-06-16 DIAGNOSIS — E785 Hyperlipidemia, unspecified: Secondary | ICD-10-CM | POA: Diagnosis not present

## 2017-06-16 DIAGNOSIS — K219 Gastro-esophageal reflux disease without esophagitis: Secondary | ICD-10-CM | POA: Diagnosis not present

## 2017-06-16 DIAGNOSIS — Z89512 Acquired absence of left leg below knee: Secondary | ICD-10-CM | POA: Diagnosis not present

## 2017-06-16 DIAGNOSIS — I2510 Atherosclerotic heart disease of native coronary artery without angina pectoris: Secondary | ICD-10-CM | POA: Diagnosis not present

## 2017-06-16 DIAGNOSIS — D649 Anemia, unspecified: Secondary | ICD-10-CM | POA: Diagnosis not present

## 2017-06-16 DIAGNOSIS — M059 Rheumatoid arthritis with rheumatoid factor, unspecified: Secondary | ICD-10-CM | POA: Diagnosis not present

## 2017-06-16 DIAGNOSIS — M159 Polyosteoarthritis, unspecified: Secondary | ICD-10-CM | POA: Diagnosis not present

## 2017-06-16 DIAGNOSIS — I10 Essential (primary) hypertension: Secondary | ICD-10-CM | POA: Diagnosis not present

## 2017-06-16 DIAGNOSIS — R1312 Dysphagia, oropharyngeal phase: Secondary | ICD-10-CM | POA: Diagnosis not present

## 2017-06-16 DIAGNOSIS — J439 Emphysema, unspecified: Secondary | ICD-10-CM | POA: Diagnosis not present

## 2017-06-16 DIAGNOSIS — L89154 Pressure ulcer of sacral region, stage 4: Secondary | ICD-10-CM | POA: Diagnosis not present

## 2017-06-16 DIAGNOSIS — Z9981 Dependence on supplemental oxygen: Secondary | ICD-10-CM | POA: Diagnosis not present

## 2017-06-17 DIAGNOSIS — F17211 Nicotine dependence, cigarettes, in remission: Secondary | ICD-10-CM | POA: Diagnosis not present

## 2017-06-17 DIAGNOSIS — R634 Abnormal weight loss: Secondary | ICD-10-CM | POA: Diagnosis not present

## 2017-06-17 DIAGNOSIS — E538 Deficiency of other specified B group vitamins: Secondary | ICD-10-CM | POA: Diagnosis not present

## 2017-06-17 DIAGNOSIS — D72819 Decreased white blood cell count, unspecified: Secondary | ICD-10-CM | POA: Diagnosis not present

## 2017-06-18 DIAGNOSIS — K219 Gastro-esophageal reflux disease without esophagitis: Secondary | ICD-10-CM | POA: Diagnosis not present

## 2017-06-18 DIAGNOSIS — I2510 Atherosclerotic heart disease of native coronary artery without angina pectoris: Secondary | ICD-10-CM | POA: Diagnosis not present

## 2017-06-18 DIAGNOSIS — M159 Polyosteoarthritis, unspecified: Secondary | ICD-10-CM | POA: Diagnosis not present

## 2017-06-18 DIAGNOSIS — M069 Rheumatoid arthritis, unspecified: Secondary | ICD-10-CM | POA: Diagnosis not present

## 2017-06-18 DIAGNOSIS — E7800 Pure hypercholesterolemia, unspecified: Secondary | ICD-10-CM | POA: Diagnosis not present

## 2017-06-18 DIAGNOSIS — J449 Chronic obstructive pulmonary disease, unspecified: Secondary | ICD-10-CM | POA: Diagnosis not present

## 2017-06-18 DIAGNOSIS — E785 Hyperlipidemia, unspecified: Secondary | ICD-10-CM | POA: Diagnosis not present

## 2017-06-18 DIAGNOSIS — K769 Liver disease, unspecified: Secondary | ICD-10-CM | POA: Diagnosis not present

## 2017-06-18 DIAGNOSIS — R1312 Dysphagia, oropharyngeal phase: Secondary | ICD-10-CM | POA: Diagnosis not present

## 2017-06-18 DIAGNOSIS — I10 Essential (primary) hypertension: Secondary | ICD-10-CM | POA: Diagnosis not present

## 2017-06-18 DIAGNOSIS — M059 Rheumatoid arthritis with rheumatoid factor, unspecified: Secondary | ICD-10-CM | POA: Diagnosis not present

## 2017-06-18 DIAGNOSIS — J439 Emphysema, unspecified: Secondary | ICD-10-CM | POA: Diagnosis not present

## 2017-06-18 DIAGNOSIS — D649 Anemia, unspecified: Secondary | ICD-10-CM | POA: Diagnosis not present

## 2017-06-18 DIAGNOSIS — Z9981 Dependence on supplemental oxygen: Secondary | ICD-10-CM | POA: Diagnosis not present

## 2017-06-18 DIAGNOSIS — Z89512 Acquired absence of left leg below knee: Secondary | ICD-10-CM | POA: Diagnosis not present

## 2017-06-18 DIAGNOSIS — Z87891 Personal history of nicotine dependence: Secondary | ICD-10-CM | POA: Diagnosis not present

## 2017-06-18 DIAGNOSIS — L89154 Pressure ulcer of sacral region, stage 4: Secondary | ICD-10-CM | POA: Diagnosis not present

## 2017-06-20 DIAGNOSIS — I2510 Atherosclerotic heart disease of native coronary artery without angina pectoris: Secondary | ICD-10-CM | POA: Diagnosis not present

## 2017-06-20 DIAGNOSIS — L89154 Pressure ulcer of sacral region, stage 4: Secondary | ICD-10-CM | POA: Diagnosis not present

## 2017-06-20 DIAGNOSIS — D72819 Decreased white blood cell count, unspecified: Secondary | ICD-10-CM | POA: Diagnosis not present

## 2017-06-20 DIAGNOSIS — J439 Emphysema, unspecified: Secondary | ICD-10-CM | POA: Diagnosis not present

## 2017-06-20 DIAGNOSIS — J449 Chronic obstructive pulmonary disease, unspecified: Secondary | ICD-10-CM | POA: Diagnosis not present

## 2017-06-20 DIAGNOSIS — K769 Liver disease, unspecified: Secondary | ICD-10-CM | POA: Diagnosis not present

## 2017-06-20 DIAGNOSIS — R590 Localized enlarged lymph nodes: Secondary | ICD-10-CM | POA: Diagnosis not present

## 2017-06-20 DIAGNOSIS — Z89512 Acquired absence of left leg below knee: Secondary | ICD-10-CM | POA: Diagnosis not present

## 2017-06-20 DIAGNOSIS — I77811 Abdominal aortic ectasia: Secondary | ICD-10-CM | POA: Diagnosis not present

## 2017-06-23 DIAGNOSIS — K219 Gastro-esophageal reflux disease without esophagitis: Secondary | ICD-10-CM | POA: Diagnosis not present

## 2017-06-23 DIAGNOSIS — E785 Hyperlipidemia, unspecified: Secondary | ICD-10-CM | POA: Diagnosis not present

## 2017-06-23 DIAGNOSIS — Z9981 Dependence on supplemental oxygen: Secondary | ICD-10-CM | POA: Diagnosis not present

## 2017-06-23 DIAGNOSIS — M159 Polyosteoarthritis, unspecified: Secondary | ICD-10-CM | POA: Diagnosis not present

## 2017-06-23 DIAGNOSIS — Z89512 Acquired absence of left leg below knee: Secondary | ICD-10-CM | POA: Diagnosis not present

## 2017-06-23 DIAGNOSIS — L89154 Pressure ulcer of sacral region, stage 4: Secondary | ICD-10-CM | POA: Diagnosis not present

## 2017-06-23 DIAGNOSIS — I2510 Atherosclerotic heart disease of native coronary artery without angina pectoris: Secondary | ICD-10-CM | POA: Diagnosis not present

## 2017-06-23 DIAGNOSIS — I10 Essential (primary) hypertension: Secondary | ICD-10-CM | POA: Diagnosis not present

## 2017-06-23 DIAGNOSIS — M059 Rheumatoid arthritis with rheumatoid factor, unspecified: Secondary | ICD-10-CM | POA: Diagnosis not present

## 2017-06-23 DIAGNOSIS — D649 Anemia, unspecified: Secondary | ICD-10-CM | POA: Diagnosis not present

## 2017-06-23 DIAGNOSIS — R1312 Dysphagia, oropharyngeal phase: Secondary | ICD-10-CM | POA: Diagnosis not present

## 2017-06-23 DIAGNOSIS — Z87891 Personal history of nicotine dependence: Secondary | ICD-10-CM | POA: Diagnosis not present

## 2017-06-23 DIAGNOSIS — K769 Liver disease, unspecified: Secondary | ICD-10-CM | POA: Diagnosis not present

## 2017-06-23 DIAGNOSIS — J439 Emphysema, unspecified: Secondary | ICD-10-CM | POA: Diagnosis not present

## 2017-06-25 DIAGNOSIS — L89154 Pressure ulcer of sacral region, stage 4: Secondary | ICD-10-CM | POA: Diagnosis not present

## 2017-06-25 DIAGNOSIS — Z9981 Dependence on supplemental oxygen: Secondary | ICD-10-CM | POA: Diagnosis not present

## 2017-06-25 DIAGNOSIS — Z87891 Personal history of nicotine dependence: Secondary | ICD-10-CM | POA: Diagnosis not present

## 2017-06-25 DIAGNOSIS — I10 Essential (primary) hypertension: Secondary | ICD-10-CM | POA: Diagnosis not present

## 2017-06-25 DIAGNOSIS — J439 Emphysema, unspecified: Secondary | ICD-10-CM | POA: Diagnosis not present

## 2017-06-25 DIAGNOSIS — M159 Polyosteoarthritis, unspecified: Secondary | ICD-10-CM | POA: Diagnosis not present

## 2017-06-25 DIAGNOSIS — K769 Liver disease, unspecified: Secondary | ICD-10-CM | POA: Diagnosis not present

## 2017-06-25 DIAGNOSIS — R1312 Dysphagia, oropharyngeal phase: Secondary | ICD-10-CM | POA: Diagnosis not present

## 2017-06-25 DIAGNOSIS — Z89512 Acquired absence of left leg below knee: Secondary | ICD-10-CM | POA: Diagnosis not present

## 2017-06-25 DIAGNOSIS — D649 Anemia, unspecified: Secondary | ICD-10-CM | POA: Diagnosis not present

## 2017-06-25 DIAGNOSIS — K219 Gastro-esophageal reflux disease without esophagitis: Secondary | ICD-10-CM | POA: Diagnosis not present

## 2017-06-25 DIAGNOSIS — I2510 Atherosclerotic heart disease of native coronary artery without angina pectoris: Secondary | ICD-10-CM | POA: Diagnosis not present

## 2017-06-25 DIAGNOSIS — M059 Rheumatoid arthritis with rheumatoid factor, unspecified: Secondary | ICD-10-CM | POA: Diagnosis not present

## 2017-06-25 DIAGNOSIS — E785 Hyperlipidemia, unspecified: Secondary | ICD-10-CM | POA: Diagnosis not present

## 2017-06-27 DIAGNOSIS — Z89512 Acquired absence of left leg below knee: Secondary | ICD-10-CM | POA: Diagnosis not present

## 2017-06-27 DIAGNOSIS — L89154 Pressure ulcer of sacral region, stage 4: Secondary | ICD-10-CM | POA: Diagnosis not present

## 2017-06-29 DIAGNOSIS — E039 Hypothyroidism, unspecified: Secondary | ICD-10-CM | POA: Diagnosis not present

## 2017-06-30 DIAGNOSIS — M159 Polyosteoarthritis, unspecified: Secondary | ICD-10-CM | POA: Diagnosis not present

## 2017-06-30 DIAGNOSIS — Z9981 Dependence on supplemental oxygen: Secondary | ICD-10-CM | POA: Diagnosis not present

## 2017-06-30 DIAGNOSIS — K769 Liver disease, unspecified: Secondary | ICD-10-CM | POA: Diagnosis not present

## 2017-06-30 DIAGNOSIS — Z89512 Acquired absence of left leg below knee: Secondary | ICD-10-CM | POA: Diagnosis not present

## 2017-06-30 DIAGNOSIS — E785 Hyperlipidemia, unspecified: Secondary | ICD-10-CM | POA: Diagnosis not present

## 2017-06-30 DIAGNOSIS — M059 Rheumatoid arthritis with rheumatoid factor, unspecified: Secondary | ICD-10-CM | POA: Diagnosis not present

## 2017-06-30 DIAGNOSIS — J439 Emphysema, unspecified: Secondary | ICD-10-CM | POA: Diagnosis not present

## 2017-06-30 DIAGNOSIS — D649 Anemia, unspecified: Secondary | ICD-10-CM | POA: Diagnosis not present

## 2017-06-30 DIAGNOSIS — I10 Essential (primary) hypertension: Secondary | ICD-10-CM | POA: Diagnosis not present

## 2017-06-30 DIAGNOSIS — R1312 Dysphagia, oropharyngeal phase: Secondary | ICD-10-CM | POA: Diagnosis not present

## 2017-06-30 DIAGNOSIS — K219 Gastro-esophageal reflux disease without esophagitis: Secondary | ICD-10-CM | POA: Diagnosis not present

## 2017-06-30 DIAGNOSIS — Z862 Personal history of diseases of the blood and blood-forming organs and certain disorders involving the immune mechanism: Secondary | ICD-10-CM | POA: Diagnosis not present

## 2017-06-30 DIAGNOSIS — I2510 Atherosclerotic heart disease of native coronary artery without angina pectoris: Secondary | ICD-10-CM | POA: Diagnosis not present

## 2017-06-30 DIAGNOSIS — L89154 Pressure ulcer of sacral region, stage 4: Secondary | ICD-10-CM | POA: Diagnosis not present

## 2017-06-30 DIAGNOSIS — D72819 Decreased white blood cell count, unspecified: Secondary | ICD-10-CM | POA: Diagnosis not present

## 2017-06-30 DIAGNOSIS — D696 Thrombocytopenia, unspecified: Secondary | ICD-10-CM | POA: Diagnosis not present

## 2017-06-30 DIAGNOSIS — Z87891 Personal history of nicotine dependence: Secondary | ICD-10-CM | POA: Diagnosis not present

## 2017-07-02 DIAGNOSIS — I10 Essential (primary) hypertension: Secondary | ICD-10-CM | POA: Diagnosis not present

## 2017-07-02 DIAGNOSIS — M159 Polyosteoarthritis, unspecified: Secondary | ICD-10-CM | POA: Diagnosis not present

## 2017-07-02 DIAGNOSIS — K769 Liver disease, unspecified: Secondary | ICD-10-CM | POA: Diagnosis not present

## 2017-07-02 DIAGNOSIS — M059 Rheumatoid arthritis with rheumatoid factor, unspecified: Secondary | ICD-10-CM | POA: Diagnosis not present

## 2017-07-02 DIAGNOSIS — K219 Gastro-esophageal reflux disease without esophagitis: Secondary | ICD-10-CM | POA: Diagnosis not present

## 2017-07-02 DIAGNOSIS — J439 Emphysema, unspecified: Secondary | ICD-10-CM | POA: Diagnosis not present

## 2017-07-02 DIAGNOSIS — R1312 Dysphagia, oropharyngeal phase: Secondary | ICD-10-CM | POA: Diagnosis not present

## 2017-07-02 DIAGNOSIS — D649 Anemia, unspecified: Secondary | ICD-10-CM | POA: Diagnosis not present

## 2017-07-02 DIAGNOSIS — Z9981 Dependence on supplemental oxygen: Secondary | ICD-10-CM | POA: Diagnosis not present

## 2017-07-02 DIAGNOSIS — I2510 Atherosclerotic heart disease of native coronary artery without angina pectoris: Secondary | ICD-10-CM | POA: Diagnosis not present

## 2017-07-02 DIAGNOSIS — Z89512 Acquired absence of left leg below knee: Secondary | ICD-10-CM | POA: Diagnosis not present

## 2017-07-02 DIAGNOSIS — E785 Hyperlipidemia, unspecified: Secondary | ICD-10-CM | POA: Diagnosis not present

## 2017-07-02 DIAGNOSIS — L89154 Pressure ulcer of sacral region, stage 4: Secondary | ICD-10-CM | POA: Diagnosis not present

## 2017-07-02 DIAGNOSIS — Z87891 Personal history of nicotine dependence: Secondary | ICD-10-CM | POA: Diagnosis not present

## 2017-07-03 DIAGNOSIS — L89154 Pressure ulcer of sacral region, stage 4: Secondary | ICD-10-CM | POA: Diagnosis not present

## 2017-07-04 DIAGNOSIS — L89154 Pressure ulcer of sacral region, stage 4: Secondary | ICD-10-CM | POA: Diagnosis not present

## 2017-07-04 DIAGNOSIS — Z89512 Acquired absence of left leg below knee: Secondary | ICD-10-CM | POA: Diagnosis not present

## 2017-07-08 DIAGNOSIS — J449 Chronic obstructive pulmonary disease, unspecified: Secondary | ICD-10-CM | POA: Diagnosis not present

## 2017-07-11 DIAGNOSIS — Z89512 Acquired absence of left leg below knee: Secondary | ICD-10-CM | POA: Diagnosis not present

## 2017-07-11 DIAGNOSIS — L89154 Pressure ulcer of sacral region, stage 4: Secondary | ICD-10-CM | POA: Diagnosis not present

## 2017-07-15 DIAGNOSIS — L89154 Pressure ulcer of sacral region, stage 4: Secondary | ICD-10-CM | POA: Diagnosis not present

## 2017-07-17 DIAGNOSIS — Z89512 Acquired absence of left leg below knee: Secondary | ICD-10-CM | POA: Diagnosis not present

## 2017-07-17 DIAGNOSIS — Z7982 Long term (current) use of aspirin: Secondary | ICD-10-CM | POA: Diagnosis not present

## 2017-07-17 DIAGNOSIS — I252 Old myocardial infarction: Secondary | ICD-10-CM | POA: Diagnosis not present

## 2017-07-17 DIAGNOSIS — L89152 Pressure ulcer of sacral region, stage 2: Secondary | ICD-10-CM | POA: Diagnosis not present

## 2017-07-17 DIAGNOSIS — Z7951 Long term (current) use of inhaled steroids: Secondary | ICD-10-CM | POA: Diagnosis not present

## 2017-07-17 DIAGNOSIS — M24541 Contracture, right hand: Secondary | ICD-10-CM | POA: Diagnosis not present

## 2017-07-17 DIAGNOSIS — M24542 Contracture, left hand: Secondary | ICD-10-CM | POA: Diagnosis not present

## 2017-07-17 DIAGNOSIS — M069 Rheumatoid arthritis, unspecified: Secondary | ICD-10-CM | POA: Diagnosis not present

## 2017-07-17 DIAGNOSIS — J449 Chronic obstructive pulmonary disease, unspecified: Secondary | ICD-10-CM | POA: Diagnosis not present

## 2017-07-21 DIAGNOSIS — L89152 Pressure ulcer of sacral region, stage 2: Secondary | ICD-10-CM | POA: Diagnosis not present

## 2017-07-22 DIAGNOSIS — J208 Acute bronchitis due to other specified organisms: Secondary | ICD-10-CM | POA: Diagnosis not present

## 2017-07-22 DIAGNOSIS — Z89512 Acquired absence of left leg below knee: Secondary | ICD-10-CM | POA: Diagnosis not present

## 2017-07-22 DIAGNOSIS — J449 Chronic obstructive pulmonary disease, unspecified: Secondary | ICD-10-CM | POA: Diagnosis not present

## 2017-07-22 DIAGNOSIS — M069 Rheumatoid arthritis, unspecified: Secondary | ICD-10-CM | POA: Diagnosis not present

## 2017-07-22 DIAGNOSIS — E7800 Pure hypercholesterolemia, unspecified: Secondary | ICD-10-CM | POA: Diagnosis not present

## 2017-07-24 DIAGNOSIS — M069 Rheumatoid arthritis, unspecified: Secondary | ICD-10-CM | POA: Diagnosis not present

## 2017-07-24 DIAGNOSIS — L89152 Pressure ulcer of sacral region, stage 2: Secondary | ICD-10-CM | POA: Diagnosis not present

## 2017-07-24 DIAGNOSIS — Z89512 Acquired absence of left leg below knee: Secondary | ICD-10-CM | POA: Diagnosis not present

## 2017-07-24 DIAGNOSIS — Z7982 Long term (current) use of aspirin: Secondary | ICD-10-CM | POA: Diagnosis not present

## 2017-07-24 DIAGNOSIS — Z7951 Long term (current) use of inhaled steroids: Secondary | ICD-10-CM | POA: Diagnosis not present

## 2017-07-24 DIAGNOSIS — I252 Old myocardial infarction: Secondary | ICD-10-CM | POA: Diagnosis not present

## 2017-07-24 DIAGNOSIS — J449 Chronic obstructive pulmonary disease, unspecified: Secondary | ICD-10-CM | POA: Diagnosis not present

## 2017-07-24 DIAGNOSIS — M24542 Contracture, left hand: Secondary | ICD-10-CM | POA: Diagnosis not present

## 2017-07-24 DIAGNOSIS — M24541 Contracture, right hand: Secondary | ICD-10-CM | POA: Diagnosis not present

## 2017-07-25 DIAGNOSIS — L89154 Pressure ulcer of sacral region, stage 4: Secondary | ICD-10-CM | POA: Diagnosis not present

## 2017-07-28 DIAGNOSIS — Z89512 Acquired absence of left leg below knee: Secondary | ICD-10-CM | POA: Diagnosis not present

## 2017-07-28 DIAGNOSIS — L89152 Pressure ulcer of sacral region, stage 2: Secondary | ICD-10-CM | POA: Diagnosis not present

## 2017-07-28 DIAGNOSIS — Z7982 Long term (current) use of aspirin: Secondary | ICD-10-CM | POA: Diagnosis not present

## 2017-07-28 DIAGNOSIS — M24542 Contracture, left hand: Secondary | ICD-10-CM | POA: Diagnosis not present

## 2017-07-28 DIAGNOSIS — I252 Old myocardial infarction: Secondary | ICD-10-CM | POA: Diagnosis not present

## 2017-07-28 DIAGNOSIS — Z7951 Long term (current) use of inhaled steroids: Secondary | ICD-10-CM | POA: Diagnosis not present

## 2017-07-28 DIAGNOSIS — M069 Rheumatoid arthritis, unspecified: Secondary | ICD-10-CM | POA: Diagnosis not present

## 2017-07-28 DIAGNOSIS — J449 Chronic obstructive pulmonary disease, unspecified: Secondary | ICD-10-CM | POA: Diagnosis not present

## 2017-07-28 DIAGNOSIS — M24541 Contracture, right hand: Secondary | ICD-10-CM | POA: Diagnosis not present

## 2017-07-30 DIAGNOSIS — M24541 Contracture, right hand: Secondary | ICD-10-CM | POA: Diagnosis not present

## 2017-07-30 DIAGNOSIS — I252 Old myocardial infarction: Secondary | ICD-10-CM | POA: Diagnosis not present

## 2017-07-30 DIAGNOSIS — M069 Rheumatoid arthritis, unspecified: Secondary | ICD-10-CM | POA: Diagnosis not present

## 2017-07-30 DIAGNOSIS — E039 Hypothyroidism, unspecified: Secondary | ICD-10-CM | POA: Diagnosis not present

## 2017-07-30 DIAGNOSIS — Z89512 Acquired absence of left leg below knee: Secondary | ICD-10-CM | POA: Diagnosis not present

## 2017-07-30 DIAGNOSIS — Z7951 Long term (current) use of inhaled steroids: Secondary | ICD-10-CM | POA: Diagnosis not present

## 2017-07-30 DIAGNOSIS — J449 Chronic obstructive pulmonary disease, unspecified: Secondary | ICD-10-CM | POA: Diagnosis not present

## 2017-07-30 DIAGNOSIS — M24542 Contracture, left hand: Secondary | ICD-10-CM | POA: Diagnosis not present

## 2017-07-30 DIAGNOSIS — Z7982 Long term (current) use of aspirin: Secondary | ICD-10-CM | POA: Diagnosis not present

## 2017-07-30 DIAGNOSIS — L89152 Pressure ulcer of sacral region, stage 2: Secondary | ICD-10-CM | POA: Diagnosis not present

## 2017-08-01 DIAGNOSIS — L89154 Pressure ulcer of sacral region, stage 4: Secondary | ICD-10-CM | POA: Diagnosis not present

## 2017-08-03 DIAGNOSIS — L89154 Pressure ulcer of sacral region, stage 4: Secondary | ICD-10-CM | POA: Diagnosis not present

## 2017-08-06 DIAGNOSIS — Z89512 Acquired absence of left leg below knee: Secondary | ICD-10-CM | POA: Diagnosis not present

## 2017-08-06 DIAGNOSIS — M069 Rheumatoid arthritis, unspecified: Secondary | ICD-10-CM | POA: Diagnosis not present

## 2017-08-06 DIAGNOSIS — Z7982 Long term (current) use of aspirin: Secondary | ICD-10-CM | POA: Diagnosis not present

## 2017-08-06 DIAGNOSIS — M24541 Contracture, right hand: Secondary | ICD-10-CM | POA: Diagnosis not present

## 2017-08-06 DIAGNOSIS — M24542 Contracture, left hand: Secondary | ICD-10-CM | POA: Diagnosis not present

## 2017-08-06 DIAGNOSIS — L89152 Pressure ulcer of sacral region, stage 2: Secondary | ICD-10-CM | POA: Diagnosis not present

## 2017-08-06 DIAGNOSIS — J449 Chronic obstructive pulmonary disease, unspecified: Secondary | ICD-10-CM | POA: Diagnosis not present

## 2017-08-06 DIAGNOSIS — Z7951 Long term (current) use of inhaled steroids: Secondary | ICD-10-CM | POA: Diagnosis not present

## 2017-08-06 DIAGNOSIS — I252 Old myocardial infarction: Secondary | ICD-10-CM | POA: Diagnosis not present

## 2017-08-07 DIAGNOSIS — J449 Chronic obstructive pulmonary disease, unspecified: Secondary | ICD-10-CM | POA: Diagnosis not present

## 2017-08-12 DIAGNOSIS — L89154 Pressure ulcer of sacral region, stage 4: Secondary | ICD-10-CM | POA: Diagnosis not present

## 2017-08-13 DIAGNOSIS — H5202 Hypermetropia, left eye: Secondary | ICD-10-CM | POA: Diagnosis not present

## 2017-08-13 DIAGNOSIS — H04123 Dry eye syndrome of bilateral lacrimal glands: Secondary | ICD-10-CM | POA: Diagnosis not present

## 2017-08-15 DIAGNOSIS — L89154 Pressure ulcer of sacral region, stage 4: Secondary | ICD-10-CM | POA: Diagnosis not present

## 2017-08-19 DIAGNOSIS — I10 Essential (primary) hypertension: Secondary | ICD-10-CM | POA: Diagnosis not present

## 2017-08-19 DIAGNOSIS — E7800 Pure hypercholesterolemia, unspecified: Secondary | ICD-10-CM | POA: Diagnosis not present

## 2017-08-19 DIAGNOSIS — J309 Allergic rhinitis, unspecified: Secondary | ICD-10-CM | POA: Diagnosis not present

## 2017-08-19 DIAGNOSIS — I2510 Atherosclerotic heart disease of native coronary artery without angina pectoris: Secondary | ICD-10-CM | POA: Diagnosis not present

## 2017-08-20 DIAGNOSIS — R1111 Vomiting without nausea: Secondary | ICD-10-CM | POA: Diagnosis not present

## 2017-08-20 DIAGNOSIS — K2970 Gastritis, unspecified, without bleeding: Secondary | ICD-10-CM | POA: Diagnosis not present

## 2017-08-20 DIAGNOSIS — K5730 Diverticulosis of large intestine without perforation or abscess without bleeding: Secondary | ICD-10-CM | POA: Diagnosis not present

## 2017-08-20 DIAGNOSIS — R1084 Generalized abdominal pain: Secondary | ICD-10-CM | POA: Diagnosis not present

## 2017-08-20 DIAGNOSIS — K529 Noninfective gastroenteritis and colitis, unspecified: Secondary | ICD-10-CM | POA: Diagnosis not present

## 2017-08-27 DIAGNOSIS — E039 Hypothyroidism, unspecified: Secondary | ICD-10-CM | POA: Diagnosis not present

## 2017-08-29 DIAGNOSIS — L89154 Pressure ulcer of sacral region, stage 4: Secondary | ICD-10-CM | POA: Diagnosis not present

## 2017-08-31 DIAGNOSIS — L89154 Pressure ulcer of sacral region, stage 4: Secondary | ICD-10-CM | POA: Diagnosis not present

## 2017-09-01 DIAGNOSIS — R768 Other specified abnormal immunological findings in serum: Secondary | ICD-10-CM | POA: Diagnosis not present

## 2017-09-01 DIAGNOSIS — M79643 Pain in unspecified hand: Secondary | ICD-10-CM | POA: Diagnosis not present

## 2017-09-01 DIAGNOSIS — M0579 Rheumatoid arthritis with rheumatoid factor of multiple sites without organ or systems involvement: Secondary | ICD-10-CM | POA: Diagnosis not present

## 2017-09-01 DIAGNOSIS — M549 Dorsalgia, unspecified: Secondary | ICD-10-CM | POA: Diagnosis not present

## 2017-09-01 DIAGNOSIS — M1990 Unspecified osteoarthritis, unspecified site: Secondary | ICD-10-CM | POA: Diagnosis not present

## 2017-09-05 DIAGNOSIS — J449 Chronic obstructive pulmonary disease, unspecified: Secondary | ICD-10-CM | POA: Diagnosis not present

## 2017-09-05 DIAGNOSIS — L89154 Pressure ulcer of sacral region, stage 4: Secondary | ICD-10-CM | POA: Diagnosis not present

## 2017-09-05 DIAGNOSIS — Z89512 Acquired absence of left leg below knee: Secondary | ICD-10-CM | POA: Diagnosis not present

## 2017-09-12 DIAGNOSIS — L89154 Pressure ulcer of sacral region, stage 4: Secondary | ICD-10-CM | POA: Diagnosis not present

## 2017-09-16 DIAGNOSIS — Z7982 Long term (current) use of aspirin: Secondary | ICD-10-CM | POA: Diagnosis not present

## 2017-09-16 DIAGNOSIS — E860 Dehydration: Secondary | ICD-10-CM | POA: Diagnosis not present

## 2017-09-16 DIAGNOSIS — I10 Essential (primary) hypertension: Secondary | ICD-10-CM | POA: Diagnosis not present

## 2017-09-16 DIAGNOSIS — Z79891 Long term (current) use of opiate analgesic: Secondary | ICD-10-CM | POA: Diagnosis not present

## 2017-09-16 DIAGNOSIS — R109 Unspecified abdominal pain: Secondary | ICD-10-CM | POA: Diagnosis not present

## 2017-09-16 DIAGNOSIS — J449 Chronic obstructive pulmonary disease, unspecified: Secondary | ICD-10-CM | POA: Diagnosis not present

## 2017-09-16 DIAGNOSIS — Z89512 Acquired absence of left leg below knee: Secondary | ICD-10-CM | POA: Diagnosis not present

## 2017-09-16 DIAGNOSIS — Z9981 Dependence on supplemental oxygen: Secondary | ICD-10-CM | POA: Diagnosis not present

## 2017-09-16 DIAGNOSIS — I714 Abdominal aortic aneurysm, without rupture: Secondary | ICD-10-CM | POA: Diagnosis not present

## 2017-09-16 DIAGNOSIS — R000 Tachycardia, unspecified: Secondary | ICD-10-CM | POA: Diagnosis not present

## 2017-09-16 DIAGNOSIS — J439 Emphysema, unspecified: Secondary | ICD-10-CM | POA: Diagnosis not present

## 2017-09-16 DIAGNOSIS — Z87891 Personal history of nicotine dependence: Secondary | ICD-10-CM | POA: Diagnosis not present

## 2017-09-16 DIAGNOSIS — I340 Nonrheumatic mitral (valve) insufficiency: Secondary | ICD-10-CM | POA: Diagnosis not present

## 2017-09-16 DIAGNOSIS — I252 Old myocardial infarction: Secondary | ICD-10-CM | POA: Diagnosis not present

## 2017-09-16 DIAGNOSIS — I2510 Atherosclerotic heart disease of native coronary artery without angina pectoris: Secondary | ICD-10-CM | POA: Diagnosis not present

## 2017-09-16 DIAGNOSIS — I69398 Other sequelae of cerebral infarction: Secondary | ICD-10-CM | POA: Diagnosis not present

## 2017-09-16 DIAGNOSIS — I272 Pulmonary hypertension, unspecified: Secondary | ICD-10-CM | POA: Diagnosis not present

## 2017-09-16 DIAGNOSIS — M069 Rheumatoid arthritis, unspecified: Secondary | ICD-10-CM | POA: Diagnosis not present

## 2017-09-16 DIAGNOSIS — K219 Gastro-esophageal reflux disease without esophagitis: Secondary | ICD-10-CM | POA: Diagnosis not present

## 2017-09-16 DIAGNOSIS — R569 Unspecified convulsions: Secondary | ICD-10-CM | POA: Diagnosis not present

## 2017-09-16 DIAGNOSIS — D696 Thrombocytopenia, unspecified: Secondary | ICD-10-CM | POA: Diagnosis not present

## 2017-09-16 DIAGNOSIS — L89154 Pressure ulcer of sacral region, stage 4: Secondary | ICD-10-CM | POA: Diagnosis not present

## 2017-09-17 ENCOUNTER — Encounter: Payer: Self-pay | Admitting: Gastroenterology

## 2017-09-17 DIAGNOSIS — I6523 Occlusion and stenosis of bilateral carotid arteries: Secondary | ICD-10-CM | POA: Diagnosis not present

## 2017-09-17 DIAGNOSIS — R410 Disorientation, unspecified: Secondary | ICD-10-CM | POA: Diagnosis not present

## 2017-09-18 DIAGNOSIS — D696 Thrombocytopenia, unspecified: Secondary | ICD-10-CM | POA: Diagnosis not present

## 2017-09-18 DIAGNOSIS — K219 Gastro-esophageal reflux disease without esophagitis: Secondary | ICD-10-CM | POA: Diagnosis not present

## 2017-09-18 DIAGNOSIS — E860 Dehydration: Secondary | ICD-10-CM | POA: Diagnosis not present

## 2017-09-18 DIAGNOSIS — I10 Essential (primary) hypertension: Secondary | ICD-10-CM | POA: Diagnosis not present

## 2017-09-24 DIAGNOSIS — J309 Allergic rhinitis, unspecified: Secondary | ICD-10-CM | POA: Diagnosis not present

## 2017-09-24 DIAGNOSIS — E7800 Pure hypercholesterolemia, unspecified: Secondary | ICD-10-CM | POA: Diagnosis not present

## 2017-09-24 DIAGNOSIS — R569 Unspecified convulsions: Secondary | ICD-10-CM | POA: Diagnosis not present

## 2017-09-24 DIAGNOSIS — I10 Essential (primary) hypertension: Secondary | ICD-10-CM | POA: Diagnosis not present

## 2017-09-27 DIAGNOSIS — E039 Hypothyroidism, unspecified: Secondary | ICD-10-CM | POA: Diagnosis not present

## 2017-10-01 DIAGNOSIS — J208 Acute bronchitis due to other specified organisms: Secondary | ICD-10-CM | POA: Diagnosis not present

## 2017-10-01 DIAGNOSIS — E7800 Pure hypercholesterolemia, unspecified: Secondary | ICD-10-CM | POA: Diagnosis not present

## 2017-10-01 DIAGNOSIS — J309 Allergic rhinitis, unspecified: Secondary | ICD-10-CM | POA: Diagnosis not present

## 2017-10-01 DIAGNOSIS — L89154 Pressure ulcer of sacral region, stage 4: Secondary | ICD-10-CM | POA: Diagnosis not present

## 2017-10-01 DIAGNOSIS — I10 Essential (primary) hypertension: Secondary | ICD-10-CM | POA: Diagnosis not present

## 2017-10-01 DIAGNOSIS — J449 Chronic obstructive pulmonary disease, unspecified: Secondary | ICD-10-CM | POA: Diagnosis not present

## 2017-10-06 DIAGNOSIS — J449 Chronic obstructive pulmonary disease, unspecified: Secondary | ICD-10-CM | POA: Diagnosis not present

## 2017-10-09 DIAGNOSIS — I10 Essential (primary) hypertension: Secondary | ICD-10-CM | POA: Diagnosis not present

## 2017-10-09 DIAGNOSIS — E7800 Pure hypercholesterolemia, unspecified: Secondary | ICD-10-CM | POA: Diagnosis not present

## 2017-10-09 DIAGNOSIS — I2510 Atherosclerotic heart disease of native coronary artery without angina pectoris: Secondary | ICD-10-CM | POA: Diagnosis not present

## 2017-10-09 DIAGNOSIS — J309 Allergic rhinitis, unspecified: Secondary | ICD-10-CM | POA: Diagnosis not present

## 2017-10-10 DIAGNOSIS — Z89512 Acquired absence of left leg below knee: Secondary | ICD-10-CM | POA: Diagnosis not present

## 2017-10-10 DIAGNOSIS — L89154 Pressure ulcer of sacral region, stage 4: Secondary | ICD-10-CM | POA: Diagnosis not present

## 2017-10-12 DIAGNOSIS — L89154 Pressure ulcer of sacral region, stage 4: Secondary | ICD-10-CM | POA: Diagnosis not present

## 2017-10-13 ENCOUNTER — Encounter: Payer: Self-pay | Admitting: Gastroenterology

## 2017-10-14 ENCOUNTER — Ambulatory Visit: Payer: Medicare Other | Admitting: Gastroenterology

## 2017-10-16 ENCOUNTER — Encounter: Payer: Self-pay | Admitting: Gastroenterology

## 2017-10-16 DIAGNOSIS — R1084 Generalized abdominal pain: Secondary | ICD-10-CM | POA: Diagnosis not present

## 2017-10-16 DIAGNOSIS — Z9981 Dependence on supplemental oxygen: Secondary | ICD-10-CM | POA: Diagnosis not present

## 2017-10-16 DIAGNOSIS — K5792 Diverticulitis of intestine, part unspecified, without perforation or abscess without bleeding: Secondary | ICD-10-CM | POA: Diagnosis not present

## 2017-10-16 DIAGNOSIS — K5732 Diverticulitis of large intestine without perforation or abscess without bleeding: Secondary | ICD-10-CM | POA: Diagnosis not present

## 2017-10-16 DIAGNOSIS — J449 Chronic obstructive pulmonary disease, unspecified: Secondary | ICD-10-CM | POA: Diagnosis not present

## 2017-10-16 DIAGNOSIS — R1110 Vomiting, unspecified: Secondary | ICD-10-CM | POA: Diagnosis not present

## 2017-10-16 DIAGNOSIS — R1111 Vomiting without nausea: Secondary | ICD-10-CM | POA: Diagnosis not present

## 2017-10-17 DIAGNOSIS — L89154 Pressure ulcer of sacral region, stage 4: Secondary | ICD-10-CM | POA: Diagnosis not present

## 2017-10-17 DIAGNOSIS — Z89512 Acquired absence of left leg below knee: Secondary | ICD-10-CM | POA: Diagnosis not present

## 2017-10-27 DIAGNOSIS — E039 Hypothyroidism, unspecified: Secondary | ICD-10-CM | POA: Diagnosis not present

## 2017-10-31 DIAGNOSIS — L89154 Pressure ulcer of sacral region, stage 4: Secondary | ICD-10-CM | POA: Diagnosis not present

## 2017-11-05 DIAGNOSIS — J449 Chronic obstructive pulmonary disease, unspecified: Secondary | ICD-10-CM | POA: Diagnosis not present

## 2017-11-07 DIAGNOSIS — Z89512 Acquired absence of left leg below knee: Secondary | ICD-10-CM | POA: Diagnosis not present

## 2017-11-07 DIAGNOSIS — Z872 Personal history of diseases of the skin and subcutaneous tissue: Secondary | ICD-10-CM | POA: Diagnosis not present

## 2017-11-07 DIAGNOSIS — L89154 Pressure ulcer of sacral region, stage 4: Secondary | ICD-10-CM | POA: Diagnosis not present

## 2017-11-07 DIAGNOSIS — Z09 Encounter for follow-up examination after completed treatment for conditions other than malignant neoplasm: Secondary | ICD-10-CM | POA: Diagnosis not present

## 2017-11-12 ENCOUNTER — Encounter: Payer: Self-pay | Admitting: Gastroenterology

## 2017-11-12 DIAGNOSIS — L89154 Pressure ulcer of sacral region, stage 4: Secondary | ICD-10-CM | POA: Diagnosis not present

## 2017-11-13 ENCOUNTER — Ambulatory Visit (INDEPENDENT_AMBULATORY_CARE_PROVIDER_SITE_OTHER): Payer: Medicare Other | Admitting: Gastroenterology

## 2017-11-13 ENCOUNTER — Encounter: Payer: Self-pay | Admitting: Gastroenterology

## 2017-11-13 VITALS — BP 128/88 | HR 85 | Ht 66.0 in

## 2017-11-13 DIAGNOSIS — R1310 Dysphagia, unspecified: Secondary | ICD-10-CM | POA: Diagnosis not present

## 2017-11-13 DIAGNOSIS — K219 Gastro-esophageal reflux disease without esophagitis: Secondary | ICD-10-CM

## 2017-11-13 MED ORDER — OMEPRAZOLE 40 MG PO CPDR: 40.0000 mg | DELAYED_RELEASE_CAPSULE | Freq: Every day | ORAL | 6 refills | Status: DC

## 2017-11-13 NOTE — Patient Instructions (Signed)
If you are age 71 or older, your body mass index should be between 23-30. Your There is no height or weight on file to calculate BMI. If this is out of the aforementioned range listed, please consider follow up with your Primary Care Provider.  If you are age 55 or younger, your body mass index should be between 19-25. Your There is no height or weight on file to calculate BMI. If this is out of the aformentioned range listed, please consider follow up with your Primary Care Provider.   We have sent the following medications to your pharmacy for you to pick up at your convenience: Omeprazole 40 mg one daily  Please purchase the following medications over the counter and take as directed: Colace daily  You have been scheduled for an endoscopy. Please follow written instructions given to you at your visit today. If you use inhalers (even only as needed), please bring them with you on the day of your procedure. Your physician has requested that you go to www.startemmi.com and enter the access code given to you at your visit today. This web site gives a general overview about your procedure. However, you should still follow specific instructions given to you by our office regarding your preparation for the procedure.   Thank you,  Dr. Lynann Bologna

## 2017-11-13 NOTE — Progress Notes (Signed)
Chief Complaint: FU  Referring Provider:  Simone Curia, MD      ASSESSMENT AND PLAN;   #1. GERD with HH with dysphagia s/p dilatation 2018 -Start omerazole 40mg  po qd -EGD with dil at [facility] (patient on oxygen and with multiple comorbid conditions). -I have instructed patient that she needs to chew food especially meats and breads well and eat slowly.  #2.  H/O diverticulitis (RH- CT- cipro/flagyl) - High Fiber Diet. -Start colace 1 tab po qd -Colon at a later date as she is just getting over diverticulitis. She had refused in the past. -Please obtain previous records - CT results from [facility] -Minimize pain medications.   HPI:    Whitney Steele is a 72 y.o. 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  She did well after previous EGD with esophageal dilatation 09/12/2016 which showed distal esophageal Schatzki's ring status post esophageal dilatation to 50 11/12/2016 [place], 5 cm hiatal hernia, mid esophageal diverticulum and a superficial gastric ulcer with negative biopsies.  Patient also had a highly tortuous esophagus suggestive of esophageal dysmotility/presbyesophagus.  This was confirmed with barium swallow.  She has stopped taking omeprazole on her own.  She was recently seen in the emergency room at [facility] with left lower quadrant abdominal pain.  She underwent CT scan of the abdomen pelvis which showed acute diverticulitis.  She was treated with Cipro and Flagyl which she has recently finished.  She does feel better.  She had refused screening colonoscopy in the past.  She still wants to hold off at the present time.  History of chronic constipation due to pain medications with associated abdominal bloating and hard  stools.   Past Medical History:  Diagnosis Date  . Abnormal AST and ALT   . Allergic rhinitis   . Anemia   . Benign essential hypertension   . COPD (chronic obstructive pulmonary disease) (HCC)   . Depression with anxiety   . Dysphagia   . GERD (gastroesophageal reflux disease)   . Hypercholesterolemia   . Hypertension   . Hypothyroidism   . Insomnia   . Osteoporosis   . Rheumatoid arthritis [facility])     Past Surgical History:  Procedure Laterality Date  . ABDOMINAL HYSTERECTOMY    . 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 CARPAL TUNNEL RELEASE    . ESOPHAGOGASTRODUODENOSCOPY  09/12/2016   Distal esophageal Schatzkis ring. Moderate hh. Highly tourturous esophagus. Mild esophageal diverticulum. Superficial gastric ulcer. Esophagus Bx: benign squamoglandular mucosa with chronic inflammation and squamous hyperplasia suggestive of gastroesophageal reflux disease. Stomach Bx: Ulceration and reactive gastropthy.  . INCISIONAL HERNIA REPAIR    . LAPAROSCOPIC CHOLECYSTECTOMY    . OVARY SURGERY     removal of ovarian masses    History reviewed. No pertinent family history.  Social History   Tobacco Use  . Smoking status: Never Smoker  . Smokeless tobacco: Never Used  Substance Use Topics  . Alcohol use: Not Currently  . Drug use: Never    Current Outpatient Medications  Medication Sig Dispense Refill  . albuterol (PROAIR HFA) 108 (90 Base) MCG/ACT inhaler Inhale 2 puffs into the lungs 2 (two) times daily.    11/12/2016 ALPRAZolam (XANAX) 0.25 MG tablet Take 0.25 mg by mouth  3 (three) times daily.    Marland Kitchen aspirin EC 81 MG tablet Take 80 mg by mouth daily.    . cyclobenzaprine (FLEXERIL) 10 MG tablet Take 10 mg by mouth as needed.    . EMBEDA 20-0.8 MG CPCR Take 20 mg by mouth 2 (two) times daily.    . fluticasone (FLONASE) 50 MCG/ACT nasal spray Place 2 sprays into both nostrils as needed.    Marland Kitchen ipratropium-albuterol (DUONEB) 0.5-2.5 (3) MG/3ML SOLN Inhale 3 mLs into the  lungs 3 (three) times daily.    . ondansetron (ZOFRAN-ODT) 4 MG disintegrating tablet Take 4 mg by mouth every 6 (six) hours.  0  . ORENCIA 125 MG/ML SOSY Inject 125 mg as directed once a week.    Marland Kitchen oxyCODONE-acetaminophen (PERCOCET) 10-325 MG tablet Take 1 tablet by mouth every 8 (eight) hours.    . predniSONE (DELTASONE) 10 MG tablet Take 10 mg by mouth as needed.    . promethazine (PHENERGAN) 25 MG tablet Take 25 mg by mouth every 6 (six) hours as needed.    . zolpidem (AMBIEN) 10 MG tablet Take 10 mg by mouth as needed.     No current facility-administered medications for this visit.     Allergies  Allergen Reactions  . Iodinated Diagnostic Agents Hives    Review of Systems:  Constitutional: Denies fever, chills, diaphoresis, appetite change and fatigue.  HEENT: Has sore throat. No sneezing, mouth sores, neck pain, neck stiffness and tinnitus.   Respiratory: On Home O2, has SOB, DOE, cough, chest tightness,  and wheezing.   Cardiovascular: Denies chest pain, palpitations and leg swelling.  Genitourinary: Denies dysuria, urgency, frequency, hematuria, flank pain and difficulty urinating.  Musculoskeletal: Has myalgias, back pain, joint swelling, arthralgias and gait problem.  Neurological: Denies dizziness, seizures, syncope, weakness, light-headedness, numbness and headaches.  Hematological: Denies adenopathy. Easy bruising, personal or family bleeding history  Psychiatric/Behavioral: Has anxiety or depression.  Physical Exam:    BP 128/88   Pulse 85   Ht 5\' 6"  (1.676 m)  There were no vitals filed for this visit. Constitutional:  Well-developed, in no acute distress. On home oxygen. Psychiatric: Normal mood and affect. Behavior is normal. HEENT: Pupils normal.  Conjunctivae are normal. No scleral icterus. Neck supple.  Cardiovascular: Normal rate, regular rhythm. No edema Pulmonary/chest:  bilateral decreased breath sounds Abdominal: Soft, nondistended. Nontender. Bowel  sounds active throughout. There are no masses palpable. No hepatomegaly. Rectal:  defered Neurological: Alert and oriented to person place and time. Skin: Skin is warm and dry. No rashes noted.   , MD 11/13/2017, 3:50 PM  Cc: 01/13/2018, MD

## 2017-11-16 DIAGNOSIS — E039 Hypothyroidism, unspecified: Secondary | ICD-10-CM | POA: Diagnosis not present

## 2017-11-16 DIAGNOSIS — Z79899 Other long term (current) drug therapy: Secondary | ICD-10-CM | POA: Diagnosis not present

## 2017-11-16 DIAGNOSIS — M1990 Unspecified osteoarthritis, unspecified site: Secondary | ICD-10-CM | POA: Diagnosis not present

## 2017-11-16 DIAGNOSIS — Z79891 Long term (current) use of opiate analgesic: Secondary | ICD-10-CM | POA: Diagnosis not present

## 2017-11-16 DIAGNOSIS — I714 Abdominal aortic aneurysm, without rupture: Secondary | ICD-10-CM | POA: Diagnosis not present

## 2017-11-16 DIAGNOSIS — R109 Unspecified abdominal pain: Secondary | ICD-10-CM | POA: Diagnosis not present

## 2017-11-16 DIAGNOSIS — Z7982 Long term (current) use of aspirin: Secondary | ICD-10-CM | POA: Diagnosis not present

## 2017-11-16 DIAGNOSIS — R1084 Generalized abdominal pain: Secondary | ICD-10-CM | POA: Diagnosis not present

## 2017-11-16 DIAGNOSIS — K5900 Constipation, unspecified: Secondary | ICD-10-CM | POA: Diagnosis not present

## 2017-11-16 DIAGNOSIS — I2510 Atherosclerotic heart disease of native coronary artery without angina pectoris: Secondary | ICD-10-CM | POA: Diagnosis not present

## 2017-11-16 DIAGNOSIS — R1030 Lower abdominal pain, unspecified: Secondary | ICD-10-CM | POA: Diagnosis not present

## 2017-11-16 DIAGNOSIS — I252 Old myocardial infarction: Secondary | ICD-10-CM | POA: Diagnosis not present

## 2017-11-16 DIAGNOSIS — Z8673 Personal history of transient ischemic attack (TIA), and cerebral infarction without residual deficits: Secondary | ICD-10-CM | POA: Diagnosis not present

## 2017-11-16 DIAGNOSIS — I10 Essential (primary) hypertension: Secondary | ICD-10-CM | POA: Diagnosis not present

## 2017-11-16 DIAGNOSIS — M069 Rheumatoid arthritis, unspecified: Secondary | ICD-10-CM | POA: Diagnosis not present

## 2017-11-18 DIAGNOSIS — J309 Allergic rhinitis, unspecified: Secondary | ICD-10-CM | POA: Diagnosis not present

## 2017-11-18 DIAGNOSIS — J449 Chronic obstructive pulmonary disease, unspecified: Secondary | ICD-10-CM | POA: Diagnosis not present

## 2017-11-18 DIAGNOSIS — E785 Hyperlipidemia, unspecified: Secondary | ICD-10-CM | POA: Diagnosis not present

## 2017-11-18 DIAGNOSIS — Z139 Encounter for screening, unspecified: Secondary | ICD-10-CM | POA: Diagnosis not present

## 2017-11-18 DIAGNOSIS — I10 Essential (primary) hypertension: Secondary | ICD-10-CM | POA: Diagnosis not present

## 2017-11-18 DIAGNOSIS — Z9181 History of falling: Secondary | ICD-10-CM | POA: Diagnosis not present

## 2017-11-18 DIAGNOSIS — J208 Acute bronchitis due to other specified organisms: Secondary | ICD-10-CM | POA: Diagnosis not present

## 2017-11-18 DIAGNOSIS — E7800 Pure hypercholesterolemia, unspecified: Secondary | ICD-10-CM | POA: Diagnosis not present

## 2017-11-18 DIAGNOSIS — Z0000 Encounter for general adult medical examination without abnormal findings: Secondary | ICD-10-CM | POA: Diagnosis not present

## 2017-11-19 ENCOUNTER — Ambulatory Visit: Payer: Medicare Other | Admitting: Neurology

## 2017-11-19 ENCOUNTER — Encounter: Payer: Self-pay | Admitting: Neurology

## 2017-11-19 ENCOUNTER — Telehealth: Payer: Self-pay | Admitting: Neurology

## 2017-11-19 NOTE — Telephone Encounter (Signed)
This patient did not show for a new patient appointment today. 

## 2017-11-27 ENCOUNTER — Other Ambulatory Visit: Payer: Self-pay

## 2017-11-27 ENCOUNTER — Telehealth: Payer: Self-pay

## 2017-11-27 DIAGNOSIS — K5900 Constipation, unspecified: Secondary | ICD-10-CM

## 2017-11-27 DIAGNOSIS — E039 Hypothyroidism, unspecified: Secondary | ICD-10-CM | POA: Diagnosis not present

## 2017-11-27 DIAGNOSIS — R9389 Abnormal findings on diagnostic imaging of other specified body structures: Secondary | ICD-10-CM

## 2017-11-27 MED ORDER — POLYETHYLENE GLYCOL 3350 17 GM/SCOOP PO POWD
17.0000 g | Freq: Every day | ORAL | 3 refills | Status: AC
Start: 2017-11-27 — End: ?

## 2017-11-28 DIAGNOSIS — J309 Allergic rhinitis, unspecified: Secondary | ICD-10-CM | POA: Diagnosis not present

## 2017-11-28 DIAGNOSIS — M069 Rheumatoid arthritis, unspecified: Secondary | ICD-10-CM | POA: Diagnosis not present

## 2017-11-28 DIAGNOSIS — Z1339 Encounter for screening examination for other mental health and behavioral disorders: Secondary | ICD-10-CM | POA: Diagnosis not present

## 2017-11-28 DIAGNOSIS — I2510 Atherosclerotic heart disease of native coronary artery without angina pectoris: Secondary | ICD-10-CM | POA: Diagnosis not present

## 2017-11-28 NOTE — Telephone Encounter (Signed)
Pt is calling back about her surgery

## 2017-12-01 DIAGNOSIS — L89154 Pressure ulcer of sacral region, stage 4: Secondary | ICD-10-CM | POA: Diagnosis not present

## 2017-12-01 DIAGNOSIS — Z7952 Long term (current) use of systemic steroids: Secondary | ICD-10-CM | POA: Diagnosis not present

## 2017-12-01 DIAGNOSIS — G8929 Other chronic pain: Secondary | ICD-10-CM | POA: Diagnosis not present

## 2017-12-01 DIAGNOSIS — I2510 Atherosclerotic heart disease of native coronary artery without angina pectoris: Secondary | ICD-10-CM | POA: Diagnosis not present

## 2017-12-01 DIAGNOSIS — I071 Rheumatic tricuspid insufficiency: Secondary | ICD-10-CM | POA: Diagnosis not present

## 2017-12-01 DIAGNOSIS — Z7982 Long term (current) use of aspirin: Secondary | ICD-10-CM | POA: Diagnosis not present

## 2017-12-01 DIAGNOSIS — I252 Old myocardial infarction: Secondary | ICD-10-CM | POA: Diagnosis not present

## 2017-12-01 DIAGNOSIS — Z79899 Other long term (current) drug therapy: Secondary | ICD-10-CM | POA: Diagnosis not present

## 2017-12-01 DIAGNOSIS — I272 Pulmonary hypertension, unspecified: Secondary | ICD-10-CM | POA: Diagnosis not present

## 2017-12-01 DIAGNOSIS — J449 Chronic obstructive pulmonary disease, unspecified: Secondary | ICD-10-CM | POA: Diagnosis not present

## 2017-12-01 DIAGNOSIS — R0789 Other chest pain: Secondary | ICD-10-CM | POA: Diagnosis not present

## 2017-12-01 DIAGNOSIS — I2699 Other pulmonary embolism without acute cor pulmonale: Secondary | ICD-10-CM | POA: Diagnosis not present

## 2017-12-01 DIAGNOSIS — R945 Abnormal results of liver function studies: Secondary | ICD-10-CM | POA: Diagnosis not present

## 2017-12-01 DIAGNOSIS — Z8673 Personal history of transient ischemic attack (TIA), and cerebral infarction without residual deficits: Secondary | ICD-10-CM | POA: Diagnosis not present

## 2017-12-01 DIAGNOSIS — I10 Essential (primary) hypertension: Secondary | ICD-10-CM | POA: Diagnosis not present

## 2017-12-01 DIAGNOSIS — I249 Acute ischemic heart disease, unspecified: Secondary | ICD-10-CM | POA: Diagnosis not present

## 2017-12-01 DIAGNOSIS — E860 Dehydration: Secondary | ICD-10-CM | POA: Diagnosis not present

## 2017-12-01 DIAGNOSIS — K219 Gastro-esophageal reflux disease without esophagitis: Secondary | ICD-10-CM | POA: Diagnosis not present

## 2017-12-01 DIAGNOSIS — Z87891 Personal history of nicotine dependence: Secondary | ICD-10-CM | POA: Diagnosis not present

## 2017-12-01 DIAGNOSIS — E785 Hyperlipidemia, unspecified: Secondary | ICD-10-CM | POA: Diagnosis not present

## 2017-12-01 DIAGNOSIS — Z955 Presence of coronary angioplasty implant and graft: Secondary | ICD-10-CM | POA: Diagnosis not present

## 2017-12-01 DIAGNOSIS — R079 Chest pain, unspecified: Secondary | ICD-10-CM | POA: Diagnosis not present

## 2017-12-01 DIAGNOSIS — Z89512 Acquired absence of left leg below knee: Secondary | ICD-10-CM | POA: Diagnosis not present

## 2017-12-01 DIAGNOSIS — I340 Nonrheumatic mitral (valve) insufficiency: Secondary | ICD-10-CM | POA: Diagnosis not present

## 2017-12-01 DIAGNOSIS — M1990 Unspecified osteoarthritis, unspecified site: Secondary | ICD-10-CM | POA: Diagnosis not present

## 2017-12-01 DIAGNOSIS — Z993 Dependence on wheelchair: Secondary | ICD-10-CM | POA: Diagnosis not present

## 2017-12-01 DIAGNOSIS — M069 Rheumatoid arthritis, unspecified: Secondary | ICD-10-CM | POA: Diagnosis not present

## 2017-12-01 NOTE — Telephone Encounter (Signed)
/  Patient had a CT of Abdomen and Pelvis on 10/16/2017. Dr. Chales Abrahams reviewed the CT and wants the patient to start taking Miralax 17g once a day. Patient is on a repeat Ultrasound in 3 years.

## 2017-12-01 NOTE — Telephone Encounter (Signed)
Patient was notified of orders. Mailed second copy of instructions for EGD at [facility] to patient.

## 2017-12-02 DIAGNOSIS — R0789 Other chest pain: Secondary | ICD-10-CM | POA: Diagnosis not present

## 2017-12-02 DIAGNOSIS — K219 Gastro-esophageal reflux disease without esophagitis: Secondary | ICD-10-CM | POA: Diagnosis not present

## 2017-12-02 DIAGNOSIS — I2510 Atherosclerotic heart disease of native coronary artery without angina pectoris: Secondary | ICD-10-CM | POA: Diagnosis not present

## 2017-12-03 DIAGNOSIS — R079 Chest pain, unspecified: Secondary | ICD-10-CM | POA: Diagnosis not present

## 2017-12-03 DIAGNOSIS — G8929 Other chronic pain: Secondary | ICD-10-CM | POA: Diagnosis not present

## 2017-12-03 DIAGNOSIS — E860 Dehydration: Secondary | ICD-10-CM | POA: Diagnosis not present

## 2017-12-03 DIAGNOSIS — K219 Gastro-esophageal reflux disease without esophagitis: Secondary | ICD-10-CM | POA: Diagnosis not present

## 2017-12-03 DIAGNOSIS — Z89512 Acquired absence of left leg below knee: Secondary | ICD-10-CM | POA: Diagnosis not present

## 2017-12-03 DIAGNOSIS — I2510 Atherosclerotic heart disease of native coronary artery without angina pectoris: Secondary | ICD-10-CM | POA: Diagnosis not present

## 2017-12-03 DIAGNOSIS — I10 Essential (primary) hypertension: Secondary | ICD-10-CM | POA: Diagnosis not present

## 2017-12-03 DIAGNOSIS — E785 Hyperlipidemia, unspecified: Secondary | ICD-10-CM | POA: Diagnosis not present

## 2017-12-03 DIAGNOSIS — M1990 Unspecified osteoarthritis, unspecified site: Secondary | ICD-10-CM | POA: Diagnosis not present

## 2017-12-03 DIAGNOSIS — J449 Chronic obstructive pulmonary disease, unspecified: Secondary | ICD-10-CM | POA: Diagnosis not present

## 2017-12-03 DIAGNOSIS — R945 Abnormal results of liver function studies: Secondary | ICD-10-CM | POA: Diagnosis not present

## 2017-12-03 DIAGNOSIS — R0789 Other chest pain: Secondary | ICD-10-CM

## 2017-12-06 DIAGNOSIS — J449 Chronic obstructive pulmonary disease, unspecified: Secondary | ICD-10-CM | POA: Diagnosis not present

## 2017-12-12 DIAGNOSIS — L89154 Pressure ulcer of sacral region, stage 4: Secondary | ICD-10-CM | POA: Diagnosis not present

## 2017-12-17 DIAGNOSIS — I252 Old myocardial infarction: Secondary | ICD-10-CM | POA: Diagnosis not present

## 2017-12-17 DIAGNOSIS — J449 Chronic obstructive pulmonary disease, unspecified: Secondary | ICD-10-CM | POA: Diagnosis not present

## 2017-12-17 DIAGNOSIS — L89152 Pressure ulcer of sacral region, stage 2: Secondary | ICD-10-CM | POA: Diagnosis not present

## 2017-12-17 DIAGNOSIS — M1990 Unspecified osteoarthritis, unspecified site: Secondary | ICD-10-CM | POA: Diagnosis not present

## 2017-12-17 DIAGNOSIS — Z87891 Personal history of nicotine dependence: Secondary | ICD-10-CM | POA: Diagnosis not present

## 2017-12-17 DIAGNOSIS — Z9981 Dependence on supplemental oxygen: Secondary | ICD-10-CM | POA: Diagnosis not present

## 2017-12-17 DIAGNOSIS — M069 Rheumatoid arthritis, unspecified: Secondary | ICD-10-CM | POA: Diagnosis not present

## 2017-12-19 DIAGNOSIS — M069 Rheumatoid arthritis, unspecified: Secondary | ICD-10-CM | POA: Diagnosis not present

## 2017-12-19 DIAGNOSIS — E039 Hypothyroidism, unspecified: Secondary | ICD-10-CM | POA: Diagnosis not present

## 2017-12-19 DIAGNOSIS — J309 Allergic rhinitis, unspecified: Secondary | ICD-10-CM | POA: Diagnosis not present

## 2017-12-19 DIAGNOSIS — I2510 Atherosclerotic heart disease of native coronary artery without angina pectoris: Secondary | ICD-10-CM | POA: Diagnosis not present

## 2017-12-27 DIAGNOSIS — E039 Hypothyroidism, unspecified: Secondary | ICD-10-CM | POA: Diagnosis not present

## 2017-12-31 DIAGNOSIS — L89154 Pressure ulcer of sacral region, stage 4: Secondary | ICD-10-CM | POA: Diagnosis not present

## 2018-01-05 ENCOUNTER — Encounter (HOSPITAL_COMMUNITY): Admission: RE | Payer: Self-pay | Source: Ambulatory Visit

## 2018-01-05 ENCOUNTER — Ambulatory Visit (HOSPITAL_COMMUNITY): Admission: RE | Admit: 2018-01-05 | Payer: Medicare Other | Source: Ambulatory Visit | Admitting: Gastroenterology

## 2018-01-05 ENCOUNTER — Encounter (HOSPITAL_COMMUNITY): Payer: Self-pay | Admitting: Anesthesiology

## 2018-01-05 DIAGNOSIS — J449 Chronic obstructive pulmonary disease, unspecified: Secondary | ICD-10-CM | POA: Diagnosis not present

## 2018-01-05 SURGERY — ESOPHAGOGASTRODUODENOSCOPY (EGD) WITH PROPOFOL
Anesthesia: Monitor Anesthesia Care

## 2018-01-05 MED ORDER — PROPOFOL 10 MG/ML IV BOLUS
INTRAVENOUS | Status: AC
  Filled 2018-01-05: qty 60

## 2018-01-08 DIAGNOSIS — R768 Other specified abnormal immunological findings in serum: Secondary | ICD-10-CM | POA: Diagnosis not present

## 2018-01-08 DIAGNOSIS — M25569 Pain in unspecified knee: Secondary | ICD-10-CM | POA: Diagnosis not present

## 2018-01-08 DIAGNOSIS — M79643 Pain in unspecified hand: Secondary | ICD-10-CM | POA: Diagnosis not present

## 2018-01-08 DIAGNOSIS — M0579 Rheumatoid arthritis with rheumatoid factor of multiple sites without organ or systems involvement: Secondary | ICD-10-CM | POA: Diagnosis not present

## 2018-01-08 DIAGNOSIS — M1990 Unspecified osteoarthritis, unspecified site: Secondary | ICD-10-CM | POA: Diagnosis not present

## 2018-01-12 DIAGNOSIS — L89154 Pressure ulcer of sacral region, stage 4: Secondary | ICD-10-CM | POA: Diagnosis not present

## 2018-01-16 DIAGNOSIS — M069 Rheumatoid arthritis, unspecified: Secondary | ICD-10-CM | POA: Diagnosis not present

## 2018-01-16 DIAGNOSIS — J309 Allergic rhinitis, unspecified: Secondary | ICD-10-CM | POA: Diagnosis not present

## 2018-01-16 DIAGNOSIS — J208 Acute bronchitis due to other specified organisms: Secondary | ICD-10-CM | POA: Diagnosis not present

## 2018-01-16 DIAGNOSIS — I2510 Atherosclerotic heart disease of native coronary artery without angina pectoris: Secondary | ICD-10-CM | POA: Diagnosis not present

## 2018-01-27 DIAGNOSIS — E039 Hypothyroidism, unspecified: Secondary | ICD-10-CM | POA: Diagnosis not present

## 2018-01-31 DIAGNOSIS — L89154 Pressure ulcer of sacral region, stage 4: Secondary | ICD-10-CM | POA: Diagnosis not present

## 2018-02-05 DIAGNOSIS — J449 Chronic obstructive pulmonary disease, unspecified: Secondary | ICD-10-CM | POA: Diagnosis not present

## 2018-02-12 DIAGNOSIS — L89154 Pressure ulcer of sacral region, stage 4: Secondary | ICD-10-CM | POA: Diagnosis not present

## 2018-02-17 DIAGNOSIS — M069 Rheumatoid arthritis, unspecified: Secondary | ICD-10-CM | POA: Diagnosis not present

## 2018-02-17 DIAGNOSIS — I2510 Atherosclerotic heart disease of native coronary artery without angina pectoris: Secondary | ICD-10-CM | POA: Diagnosis not present

## 2018-02-17 DIAGNOSIS — K219 Gastro-esophageal reflux disease without esophagitis: Secondary | ICD-10-CM | POA: Diagnosis not present

## 2018-02-17 DIAGNOSIS — J309 Allergic rhinitis, unspecified: Secondary | ICD-10-CM | POA: Diagnosis not present

## 2018-02-27 DIAGNOSIS — E039 Hypothyroidism, unspecified: Secondary | ICD-10-CM | POA: Diagnosis not present

## 2018-03-03 DIAGNOSIS — L89154 Pressure ulcer of sacral region, stage 4: Secondary | ICD-10-CM | POA: Diagnosis not present

## 2018-03-08 DIAGNOSIS — J449 Chronic obstructive pulmonary disease, unspecified: Secondary | ICD-10-CM | POA: Diagnosis not present

## 2018-03-14 DIAGNOSIS — L89154 Pressure ulcer of sacral region, stage 4: Secondary | ICD-10-CM | POA: Diagnosis not present

## 2018-03-17 DIAGNOSIS — J9611 Chronic respiratory failure with hypoxia: Secondary | ICD-10-CM | POA: Diagnosis not present

## 2018-03-17 DIAGNOSIS — Z23 Encounter for immunization: Secondary | ICD-10-CM | POA: Diagnosis not present

## 2018-03-17 DIAGNOSIS — E7800 Pure hypercholesterolemia, unspecified: Secondary | ICD-10-CM | POA: Diagnosis not present

## 2018-03-17 DIAGNOSIS — J309 Allergic rhinitis, unspecified: Secondary | ICD-10-CM | POA: Diagnosis not present

## 2018-03-17 DIAGNOSIS — K219 Gastro-esophageal reflux disease without esophagitis: Secondary | ICD-10-CM | POA: Diagnosis not present

## 2018-03-17 DIAGNOSIS — I10 Essential (primary) hypertension: Secondary | ICD-10-CM | POA: Diagnosis not present

## 2018-03-29 DIAGNOSIS — E039 Hypothyroidism, unspecified: Secondary | ICD-10-CM | POA: Diagnosis not present

## 2018-04-02 DIAGNOSIS — L89154 Pressure ulcer of sacral region, stage 4: Secondary | ICD-10-CM | POA: Diagnosis not present

## 2018-04-07 DIAGNOSIS — J449 Chronic obstructive pulmonary disease, unspecified: Secondary | ICD-10-CM | POA: Diagnosis not present

## 2018-04-14 DIAGNOSIS — J9611 Chronic respiratory failure with hypoxia: Secondary | ICD-10-CM | POA: Diagnosis not present

## 2018-04-14 DIAGNOSIS — I2510 Atherosclerotic heart disease of native coronary artery without angina pectoris: Secondary | ICD-10-CM | POA: Diagnosis not present

## 2018-04-14 DIAGNOSIS — J309 Allergic rhinitis, unspecified: Secondary | ICD-10-CM | POA: Diagnosis not present

## 2018-04-14 DIAGNOSIS — M069 Rheumatoid arthritis, unspecified: Secondary | ICD-10-CM | POA: Diagnosis not present

## 2018-04-29 DIAGNOSIS — E039 Hypothyroidism, unspecified: Secondary | ICD-10-CM | POA: Diagnosis not present

## 2018-05-08 DIAGNOSIS — J449 Chronic obstructive pulmonary disease, unspecified: Secondary | ICD-10-CM | POA: Diagnosis not present

## 2018-05-14 DIAGNOSIS — M069 Rheumatoid arthritis, unspecified: Secondary | ICD-10-CM | POA: Diagnosis not present

## 2018-05-14 DIAGNOSIS — J9611 Chronic respiratory failure with hypoxia: Secondary | ICD-10-CM | POA: Diagnosis not present

## 2018-05-14 DIAGNOSIS — I2510 Atherosclerotic heart disease of native coronary artery without angina pectoris: Secondary | ICD-10-CM | POA: Diagnosis not present

## 2018-05-14 DIAGNOSIS — J309 Allergic rhinitis, unspecified: Secondary | ICD-10-CM | POA: Diagnosis not present

## 2018-05-29 DIAGNOSIS — E039 Hypothyroidism, unspecified: Secondary | ICD-10-CM | POA: Diagnosis not present

## 2018-06-05 DIAGNOSIS — Z09 Encounter for follow-up examination after completed treatment for conditions other than malignant neoplasm: Secondary | ICD-10-CM | POA: Diagnosis not present

## 2018-06-05 DIAGNOSIS — L89152 Pressure ulcer of sacral region, stage 2: Secondary | ICD-10-CM | POA: Diagnosis not present

## 2018-06-07 DIAGNOSIS — J449 Chronic obstructive pulmonary disease, unspecified: Secondary | ICD-10-CM | POA: Diagnosis not present

## 2018-06-11 DIAGNOSIS — J9611 Chronic respiratory failure with hypoxia: Secondary | ICD-10-CM | POA: Diagnosis not present

## 2018-06-11 DIAGNOSIS — M069 Rheumatoid arthritis, unspecified: Secondary | ICD-10-CM | POA: Diagnosis not present

## 2018-06-11 DIAGNOSIS — I2510 Atherosclerotic heart disease of native coronary artery without angina pectoris: Secondary | ICD-10-CM | POA: Diagnosis not present

## 2018-06-11 DIAGNOSIS — J309 Allergic rhinitis, unspecified: Secondary | ICD-10-CM | POA: Diagnosis not present

## 2018-06-25 DIAGNOSIS — M069 Rheumatoid arthritis, unspecified: Secondary | ICD-10-CM | POA: Diagnosis not present

## 2018-06-25 DIAGNOSIS — J309 Allergic rhinitis, unspecified: Secondary | ICD-10-CM | POA: Diagnosis not present

## 2018-06-25 DIAGNOSIS — J9611 Chronic respiratory failure with hypoxia: Secondary | ICD-10-CM | POA: Diagnosis not present

## 2018-06-25 DIAGNOSIS — I2510 Atherosclerotic heart disease of native coronary artery without angina pectoris: Secondary | ICD-10-CM | POA: Diagnosis not present

## 2018-06-29 DIAGNOSIS — E039 Hypothyroidism, unspecified: Secondary | ICD-10-CM | POA: Diagnosis not present

## 2018-07-08 DIAGNOSIS — Z87891 Personal history of nicotine dependence: Secondary | ICD-10-CM | POA: Diagnosis not present

## 2018-07-08 DIAGNOSIS — L89152 Pressure ulcer of sacral region, stage 2: Secondary | ICD-10-CM | POA: Diagnosis not present

## 2018-07-08 DIAGNOSIS — M069 Rheumatoid arthritis, unspecified: Secondary | ICD-10-CM | POA: Diagnosis not present

## 2018-07-08 DIAGNOSIS — I96 Gangrene, not elsewhere classified: Secondary | ICD-10-CM | POA: Diagnosis not present

## 2018-07-08 DIAGNOSIS — Z9981 Dependence on supplemental oxygen: Secondary | ICD-10-CM | POA: Diagnosis not present

## 2018-07-08 DIAGNOSIS — J449 Chronic obstructive pulmonary disease, unspecified: Secondary | ICD-10-CM | POA: Diagnosis not present

## 2018-07-08 DIAGNOSIS — M1990 Unspecified osteoarthritis, unspecified site: Secondary | ICD-10-CM | POA: Diagnosis not present

## 2018-07-15 DIAGNOSIS — M069 Rheumatoid arthritis, unspecified: Secondary | ICD-10-CM | POA: Diagnosis not present

## 2018-07-15 DIAGNOSIS — I10 Essential (primary) hypertension: Secondary | ICD-10-CM | POA: Diagnosis not present

## 2018-07-15 DIAGNOSIS — R6889 Other general symptoms and signs: Secondary | ICD-10-CM | POA: Diagnosis not present

## 2018-07-15 DIAGNOSIS — J309 Allergic rhinitis, unspecified: Secondary | ICD-10-CM | POA: Diagnosis not present

## 2018-07-15 DIAGNOSIS — I2510 Atherosclerotic heart disease of native coronary artery without angina pectoris: Secondary | ICD-10-CM | POA: Diagnosis not present

## 2018-07-15 DIAGNOSIS — E7800 Pure hypercholesterolemia, unspecified: Secondary | ICD-10-CM | POA: Diagnosis not present

## 2018-07-16 DIAGNOSIS — L89152 Pressure ulcer of sacral region, stage 2: Secondary | ICD-10-CM | POA: Diagnosis not present

## 2018-07-16 DIAGNOSIS — Z09 Encounter for follow-up examination after completed treatment for conditions other than malignant neoplasm: Secondary | ICD-10-CM | POA: Diagnosis not present

## 2018-07-16 DIAGNOSIS — Z872 Personal history of diseases of the skin and subcutaneous tissue: Secondary | ICD-10-CM | POA: Diagnosis not present

## 2018-07-29 DIAGNOSIS — I2510 Atherosclerotic heart disease of native coronary artery without angina pectoris: Secondary | ICD-10-CM | POA: Diagnosis not present

## 2018-07-29 DIAGNOSIS — M159 Polyosteoarthritis, unspecified: Secondary | ICD-10-CM | POA: Diagnosis not present

## 2018-07-29 DIAGNOSIS — M069 Rheumatoid arthritis, unspecified: Secondary | ICD-10-CM | POA: Diagnosis not present

## 2018-07-29 DIAGNOSIS — J309 Allergic rhinitis, unspecified: Secondary | ICD-10-CM | POA: Diagnosis not present

## 2018-07-30 DIAGNOSIS — E039 Hypothyroidism, unspecified: Secondary | ICD-10-CM | POA: Diagnosis not present

## 2018-08-07 DIAGNOSIS — J449 Chronic obstructive pulmonary disease, unspecified: Secondary | ICD-10-CM | POA: Diagnosis not present

## 2018-08-07 DIAGNOSIS — R278 Other lack of coordination: Secondary | ICD-10-CM | POA: Diagnosis not present

## 2018-08-07 DIAGNOSIS — M542 Cervicalgia: Secondary | ICD-10-CM | POA: Diagnosis not present

## 2018-08-07 DIAGNOSIS — Z9981 Dependence on supplemental oxygen: Secondary | ICD-10-CM | POA: Diagnosis not present

## 2018-08-07 DIAGNOSIS — R0603 Acute respiratory distress: Secondary | ICD-10-CM | POA: Diagnosis not present

## 2018-08-07 DIAGNOSIS — J9601 Acute respiratory failure with hypoxia: Secondary | ICD-10-CM | POA: Diagnosis not present

## 2018-08-07 DIAGNOSIS — M1990 Unspecified osteoarthritis, unspecified site: Secondary | ICD-10-CM | POA: Diagnosis not present

## 2018-08-07 DIAGNOSIS — Z79891 Long term (current) use of opiate analgesic: Secondary | ICD-10-CM | POA: Diagnosis not present

## 2018-08-07 DIAGNOSIS — S0990XA Unspecified injury of head, initial encounter: Secondary | ICD-10-CM | POA: Diagnosis not present

## 2018-08-07 DIAGNOSIS — D696 Thrombocytopenia, unspecified: Secondary | ICD-10-CM | POA: Diagnosis not present

## 2018-08-07 DIAGNOSIS — J8410 Pulmonary fibrosis, unspecified: Secondary | ICD-10-CM | POA: Diagnosis not present

## 2018-08-07 DIAGNOSIS — K219 Gastro-esophageal reflux disease without esophagitis: Secondary | ICD-10-CM | POA: Diagnosis not present

## 2018-08-07 DIAGNOSIS — Z87440 Personal history of urinary (tract) infections: Secondary | ICD-10-CM | POA: Diagnosis not present

## 2018-08-07 DIAGNOSIS — G8929 Other chronic pain: Secondary | ICD-10-CM | POA: Diagnosis not present

## 2018-08-07 DIAGNOSIS — R279 Unspecified lack of coordination: Secondary | ICD-10-CM | POA: Diagnosis not present

## 2018-08-07 DIAGNOSIS — I21A1 Myocardial infarction type 2: Secondary | ICD-10-CM | POA: Diagnosis not present

## 2018-08-07 DIAGNOSIS — R0689 Other abnormalities of breathing: Secondary | ICD-10-CM | POA: Diagnosis not present

## 2018-08-07 DIAGNOSIS — I5023 Acute on chronic systolic (congestive) heart failure: Secondary | ICD-10-CM | POA: Diagnosis not present

## 2018-08-07 DIAGNOSIS — R262 Difficulty in walking, not elsewhere classified: Secondary | ICD-10-CM | POA: Diagnosis not present

## 2018-08-07 DIAGNOSIS — M6281 Muscle weakness (generalized): Secondary | ICD-10-CM | POA: Diagnosis not present

## 2018-08-07 DIAGNOSIS — I2510 Atherosclerotic heart disease of native coronary artery without angina pectoris: Secondary | ICD-10-CM | POA: Diagnosis not present

## 2018-08-07 DIAGNOSIS — Z955 Presence of coronary angioplasty implant and graft: Secondary | ICD-10-CM | POA: Diagnosis not present

## 2018-08-07 DIAGNOSIS — S72002D Fracture of unspecified part of neck of left femur, subsequent encounter for closed fracture with routine healing: Secondary | ICD-10-CM | POA: Diagnosis not present

## 2018-08-07 DIAGNOSIS — Z91041 Radiographic dye allergy status: Secondary | ICD-10-CM | POA: Diagnosis not present

## 2018-08-07 DIAGNOSIS — R05 Cough: Secondary | ICD-10-CM | POA: Diagnosis not present

## 2018-08-07 DIAGNOSIS — I110 Hypertensive heart disease with heart failure: Secondary | ICD-10-CM | POA: Diagnosis not present

## 2018-08-07 DIAGNOSIS — W19XXXA Unspecified fall, initial encounter: Secondary | ICD-10-CM | POA: Diagnosis not present

## 2018-08-07 DIAGNOSIS — I252 Old myocardial infarction: Secondary | ICD-10-CM | POA: Diagnosis not present

## 2018-08-07 DIAGNOSIS — D638 Anemia in other chronic diseases classified elsewhere: Secondary | ICD-10-CM | POA: Diagnosis not present

## 2018-08-07 DIAGNOSIS — S72002A Fracture of unspecified part of neck of left femur, initial encounter for closed fracture: Secondary | ICD-10-CM | POA: Diagnosis not present

## 2018-08-07 DIAGNOSIS — N3000 Acute cystitis without hematuria: Secondary | ICD-10-CM | POA: Diagnosis not present

## 2018-08-07 DIAGNOSIS — N390 Urinary tract infection, site not specified: Secondary | ICD-10-CM | POA: Diagnosis not present

## 2018-08-07 DIAGNOSIS — J9621 Acute and chronic respiratory failure with hypoxia: Secondary | ICD-10-CM | POA: Diagnosis not present

## 2018-08-07 DIAGNOSIS — S72042A Displaced fracture of base of neck of left femur, initial encounter for closed fracture: Secondary | ICD-10-CM | POA: Diagnosis not present

## 2018-08-07 DIAGNOSIS — Z993 Dependence on wheelchair: Secondary | ICD-10-CM | POA: Diagnosis not present

## 2018-08-07 DIAGNOSIS — I714 Abdominal aortic aneurysm, without rupture: Secondary | ICD-10-CM | POA: Diagnosis not present

## 2018-08-07 DIAGNOSIS — R269 Unspecified abnormalities of gait and mobility: Secondary | ICD-10-CM | POA: Diagnosis not present

## 2018-08-07 DIAGNOSIS — I214 Non-ST elevation (NSTEMI) myocardial infarction: Secondary | ICD-10-CM | POA: Diagnosis not present

## 2018-08-07 DIAGNOSIS — M25552 Pain in left hip: Secondary | ICD-10-CM | POA: Diagnosis not present

## 2018-08-07 DIAGNOSIS — G894 Chronic pain syndrome: Secondary | ICD-10-CM | POA: Diagnosis not present

## 2018-08-07 DIAGNOSIS — S199XXA Unspecified injury of neck, initial encounter: Secondary | ICD-10-CM | POA: Diagnosis not present

## 2018-08-07 DIAGNOSIS — R633 Feeding difficulties: Secondary | ICD-10-CM | POA: Diagnosis not present

## 2018-08-07 DIAGNOSIS — R0602 Shortness of breath: Secondary | ICD-10-CM | POA: Diagnosis not present

## 2018-08-07 DIAGNOSIS — I10 Essential (primary) hypertension: Secondary | ICD-10-CM | POA: Diagnosis not present

## 2018-08-07 DIAGNOSIS — J441 Chronic obstructive pulmonary disease with (acute) exacerbation: Secondary | ICD-10-CM | POA: Diagnosis not present

## 2018-08-07 DIAGNOSIS — R1310 Dysphagia, unspecified: Secondary | ICD-10-CM | POA: Diagnosis not present

## 2018-08-07 DIAGNOSIS — R000 Tachycardia, unspecified: Secondary | ICD-10-CM | POA: Diagnosis not present

## 2018-08-07 DIAGNOSIS — J9690 Respiratory failure, unspecified, unspecified whether with hypoxia or hypercapnia: Secondary | ICD-10-CM | POA: Diagnosis not present

## 2018-08-07 DIAGNOSIS — M2550 Pain in unspecified joint: Secondary | ICD-10-CM | POA: Diagnosis not present

## 2018-08-07 DIAGNOSIS — E785 Hyperlipidemia, unspecified: Secondary | ICD-10-CM | POA: Diagnosis not present

## 2018-08-07 DIAGNOSIS — Z741 Need for assistance with personal care: Secondary | ICD-10-CM | POA: Diagnosis not present

## 2018-08-07 DIAGNOSIS — Z7401 Bed confinement status: Secondary | ICD-10-CM | POA: Diagnosis not present

## 2018-08-07 DIAGNOSIS — K5900 Constipation, unspecified: Secondary | ICD-10-CM | POA: Diagnosis not present

## 2018-08-07 DIAGNOSIS — Z8673 Personal history of transient ischemic attack (TIA), and cerebral infarction without residual deficits: Secondary | ICD-10-CM | POA: Diagnosis not present

## 2018-08-07 DIAGNOSIS — E872 Acidosis: Secondary | ICD-10-CM | POA: Diagnosis not present

## 2018-08-07 DIAGNOSIS — R51 Headache: Secondary | ICD-10-CM | POA: Diagnosis not present

## 2018-08-07 DIAGNOSIS — M069 Rheumatoid arthritis, unspecified: Secondary | ICD-10-CM | POA: Diagnosis not present

## 2018-08-07 DIAGNOSIS — R069 Unspecified abnormalities of breathing: Secondary | ICD-10-CM | POA: Diagnosis not present

## 2018-08-07 DIAGNOSIS — I5022 Chronic systolic (congestive) heart failure: Secondary | ICD-10-CM | POA: Diagnosis not present

## 2018-08-07 DIAGNOSIS — Z89512 Acquired absence of left leg below knee: Secondary | ICD-10-CM | POA: Diagnosis not present

## 2018-08-08 DIAGNOSIS — R0603 Acute respiratory distress: Secondary | ICD-10-CM

## 2018-08-15 DIAGNOSIS — R410 Disorientation, unspecified: Secondary | ICD-10-CM | POA: Diagnosis not present

## 2018-08-15 DIAGNOSIS — Z89512 Acquired absence of left leg below knee: Secondary | ICD-10-CM | POA: Diagnosis not present

## 2018-08-15 DIAGNOSIS — R269 Unspecified abnormalities of gait and mobility: Secondary | ICD-10-CM | POA: Diagnosis not present

## 2018-08-15 DIAGNOSIS — M2550 Pain in unspecified joint: Secondary | ICD-10-CM | POA: Diagnosis not present

## 2018-08-15 DIAGNOSIS — G894 Chronic pain syndrome: Secondary | ICD-10-CM | POA: Diagnosis not present

## 2018-08-15 DIAGNOSIS — D696 Thrombocytopenia, unspecified: Secondary | ICD-10-CM | POA: Diagnosis not present

## 2018-08-15 DIAGNOSIS — K219 Gastro-esophageal reflux disease without esophagitis: Secondary | ICD-10-CM | POA: Diagnosis not present

## 2018-08-15 DIAGNOSIS — I5023 Acute on chronic systolic (congestive) heart failure: Secondary | ICD-10-CM | POA: Diagnosis not present

## 2018-08-15 DIAGNOSIS — Z7401 Bed confinement status: Secondary | ICD-10-CM | POA: Diagnosis not present

## 2018-08-15 DIAGNOSIS — J309 Allergic rhinitis, unspecified: Secondary | ICD-10-CM | POA: Diagnosis not present

## 2018-08-15 DIAGNOSIS — S72009A Fracture of unspecified part of neck of unspecified femur, initial encounter for closed fracture: Secondary | ICD-10-CM | POA: Diagnosis not present

## 2018-08-15 DIAGNOSIS — K5900 Constipation, unspecified: Secondary | ICD-10-CM | POA: Diagnosis not present

## 2018-08-15 DIAGNOSIS — J9610 Chronic respiratory failure, unspecified whether with hypoxia or hypercapnia: Secondary | ICD-10-CM | POA: Diagnosis not present

## 2018-08-15 DIAGNOSIS — G8929 Other chronic pain: Secondary | ICD-10-CM | POA: Diagnosis not present

## 2018-08-15 DIAGNOSIS — D638 Anemia in other chronic diseases classified elsewhere: Secondary | ICD-10-CM | POA: Diagnosis not present

## 2018-08-15 DIAGNOSIS — J9621 Acute and chronic respiratory failure with hypoxia: Secondary | ICD-10-CM | POA: Diagnosis not present

## 2018-08-15 DIAGNOSIS — J449 Chronic obstructive pulmonary disease, unspecified: Secondary | ICD-10-CM | POA: Diagnosis not present

## 2018-08-15 DIAGNOSIS — I5020 Unspecified systolic (congestive) heart failure: Secondary | ICD-10-CM | POA: Diagnosis not present

## 2018-08-15 DIAGNOSIS — D649 Anemia, unspecified: Secondary | ICD-10-CM | POA: Diagnosis not present

## 2018-08-15 DIAGNOSIS — M1990 Unspecified osteoarthritis, unspecified site: Secondary | ICD-10-CM | POA: Diagnosis not present

## 2018-08-15 DIAGNOSIS — I10 Essential (primary) hypertension: Secondary | ICD-10-CM | POA: Diagnosis not present

## 2018-08-15 DIAGNOSIS — R1310 Dysphagia, unspecified: Secondary | ICD-10-CM | POA: Diagnosis not present

## 2018-08-15 DIAGNOSIS — Z741 Need for assistance with personal care: Secondary | ICD-10-CM | POA: Diagnosis not present

## 2018-08-15 DIAGNOSIS — I2510 Atherosclerotic heart disease of native coronary artery without angina pectoris: Secondary | ICD-10-CM | POA: Diagnosis not present

## 2018-08-15 DIAGNOSIS — E785 Hyperlipidemia, unspecified: Secondary | ICD-10-CM | POA: Diagnosis not present

## 2018-08-15 DIAGNOSIS — R278 Other lack of coordination: Secondary | ICD-10-CM | POA: Diagnosis not present

## 2018-08-15 DIAGNOSIS — R279 Unspecified lack of coordination: Secondary | ICD-10-CM | POA: Diagnosis not present

## 2018-08-15 DIAGNOSIS — J9690 Respiratory failure, unspecified, unspecified whether with hypoxia or hypercapnia: Secondary | ICD-10-CM | POA: Diagnosis not present

## 2018-08-15 DIAGNOSIS — J441 Chronic obstructive pulmonary disease with (acute) exacerbation: Secondary | ICD-10-CM | POA: Diagnosis not present

## 2018-08-15 DIAGNOSIS — E039 Hypothyroidism, unspecified: Secondary | ICD-10-CM | POA: Diagnosis not present

## 2018-08-15 DIAGNOSIS — M069 Rheumatoid arthritis, unspecified: Secondary | ICD-10-CM | POA: Diagnosis not present

## 2018-08-15 DIAGNOSIS — R110 Nausea: Secondary | ICD-10-CM | POA: Diagnosis not present

## 2018-08-15 DIAGNOSIS — M6281 Muscle weakness (generalized): Secondary | ICD-10-CM | POA: Diagnosis not present

## 2018-08-15 DIAGNOSIS — W19XXXA Unspecified fall, initial encounter: Secondary | ICD-10-CM | POA: Diagnosis not present

## 2018-08-15 DIAGNOSIS — M159 Polyosteoarthritis, unspecified: Secondary | ICD-10-CM | POA: Diagnosis not present

## 2018-08-15 DIAGNOSIS — R262 Difficulty in walking, not elsewhere classified: Secondary | ICD-10-CM | POA: Diagnosis not present

## 2018-08-15 DIAGNOSIS — I214 Non-ST elevation (NSTEMI) myocardial infarction: Secondary | ICD-10-CM | POA: Diagnosis not present

## 2018-08-15 DIAGNOSIS — S72002A Fracture of unspecified part of neck of left femur, initial encounter for closed fracture: Secondary | ICD-10-CM | POA: Diagnosis not present

## 2018-08-15 DIAGNOSIS — R339 Retention of urine, unspecified: Secondary | ICD-10-CM | POA: Diagnosis not present

## 2018-08-15 DIAGNOSIS — N390 Urinary tract infection, site not specified: Secondary | ICD-10-CM | POA: Diagnosis not present

## 2018-08-15 DIAGNOSIS — S72002D Fracture of unspecified part of neck of left femur, subsequent encounter for closed fracture with routine healing: Secondary | ICD-10-CM | POA: Diagnosis not present

## 2018-08-15 DIAGNOSIS — R633 Feeding difficulties: Secondary | ICD-10-CM | POA: Diagnosis not present

## 2018-08-17 DIAGNOSIS — D638 Anemia in other chronic diseases classified elsewhere: Secondary | ICD-10-CM | POA: Diagnosis not present

## 2018-08-17 DIAGNOSIS — I5020 Unspecified systolic (congestive) heart failure: Secondary | ICD-10-CM | POA: Diagnosis not present

## 2018-08-17 DIAGNOSIS — J449 Chronic obstructive pulmonary disease, unspecified: Secondary | ICD-10-CM | POA: Diagnosis not present

## 2018-08-17 DIAGNOSIS — I10 Essential (primary) hypertension: Secondary | ICD-10-CM | POA: Diagnosis not present

## 2018-08-18 DIAGNOSIS — I214 Non-ST elevation (NSTEMI) myocardial infarction: Secondary | ICD-10-CM | POA: Diagnosis not present

## 2018-08-18 DIAGNOSIS — J449 Chronic obstructive pulmonary disease, unspecified: Secondary | ICD-10-CM | POA: Diagnosis not present

## 2018-08-18 DIAGNOSIS — S72002D Fracture of unspecified part of neck of left femur, subsequent encounter for closed fracture with routine healing: Secondary | ICD-10-CM | POA: Diagnosis not present

## 2018-08-18 DIAGNOSIS — J9610 Chronic respiratory failure, unspecified whether with hypoxia or hypercapnia: Secondary | ICD-10-CM | POA: Diagnosis not present

## 2018-08-28 DIAGNOSIS — S72009A Fracture of unspecified part of neck of unspecified femur, initial encounter for closed fracture: Secondary | ICD-10-CM | POA: Diagnosis not present

## 2018-09-03 DIAGNOSIS — S72002D Fracture of unspecified part of neck of left femur, subsequent encounter for closed fracture with routine healing: Secondary | ICD-10-CM | POA: Diagnosis not present

## 2018-09-03 DIAGNOSIS — R339 Retention of urine, unspecified: Secondary | ICD-10-CM | POA: Diagnosis not present

## 2018-09-03 DIAGNOSIS — R410 Disorientation, unspecified: Secondary | ICD-10-CM | POA: Diagnosis not present

## 2018-09-08 DIAGNOSIS — S72002D Fracture of unspecified part of neck of left femur, subsequent encounter for closed fracture with routine healing: Secondary | ICD-10-CM | POA: Diagnosis not present

## 2018-09-08 DIAGNOSIS — R339 Retention of urine, unspecified: Secondary | ICD-10-CM | POA: Diagnosis not present

## 2018-09-08 DIAGNOSIS — R110 Nausea: Secondary | ICD-10-CM | POA: Diagnosis not present

## 2018-09-15 DIAGNOSIS — R339 Retention of urine, unspecified: Secondary | ICD-10-CM | POA: Diagnosis not present

## 2018-09-15 DIAGNOSIS — S72002D Fracture of unspecified part of neck of left femur, subsequent encounter for closed fracture with routine healing: Secondary | ICD-10-CM | POA: Diagnosis not present

## 2018-09-15 DIAGNOSIS — J449 Chronic obstructive pulmonary disease, unspecified: Secondary | ICD-10-CM | POA: Diagnosis not present

## 2018-09-15 DIAGNOSIS — J9610 Chronic respiratory failure, unspecified whether with hypoxia or hypercapnia: Secondary | ICD-10-CM | POA: Diagnosis not present

## 2018-09-17 DIAGNOSIS — J309 Allergic rhinitis, unspecified: Secondary | ICD-10-CM | POA: Diagnosis not present

## 2018-09-17 DIAGNOSIS — I2510 Atherosclerotic heart disease of native coronary artery without angina pectoris: Secondary | ICD-10-CM | POA: Diagnosis not present

## 2018-09-17 DIAGNOSIS — M069 Rheumatoid arthritis, unspecified: Secondary | ICD-10-CM | POA: Diagnosis not present

## 2018-09-17 DIAGNOSIS — M159 Polyosteoarthritis, unspecified: Secondary | ICD-10-CM | POA: Diagnosis not present

## 2018-09-19 DIAGNOSIS — M159 Polyosteoarthritis, unspecified: Secondary | ICD-10-CM | POA: Diagnosis not present

## 2018-09-19 DIAGNOSIS — K219 Gastro-esophageal reflux disease without esophagitis: Secondary | ICD-10-CM | POA: Diagnosis not present

## 2018-09-19 DIAGNOSIS — N390 Urinary tract infection, site not specified: Secondary | ICD-10-CM | POA: Diagnosis not present

## 2018-09-19 DIAGNOSIS — I110 Hypertensive heart disease with heart failure: Secondary | ICD-10-CM | POA: Diagnosis not present

## 2018-09-19 DIAGNOSIS — R339 Retention of urine, unspecified: Secondary | ICD-10-CM | POA: Diagnosis not present

## 2018-09-19 DIAGNOSIS — J9621 Acute and chronic respiratory failure with hypoxia: Secondary | ICD-10-CM | POA: Diagnosis not present

## 2018-09-19 DIAGNOSIS — I5020 Unspecified systolic (congestive) heart failure: Secondary | ICD-10-CM | POA: Diagnosis not present

## 2018-09-19 DIAGNOSIS — D696 Thrombocytopenia, unspecified: Secondary | ICD-10-CM | POA: Diagnosis not present

## 2018-09-19 DIAGNOSIS — I2510 Atherosclerotic heart disease of native coronary artery without angina pectoris: Secondary | ICD-10-CM | POA: Diagnosis not present

## 2018-09-19 DIAGNOSIS — Z87891 Personal history of nicotine dependence: Secondary | ICD-10-CM | POA: Diagnosis not present

## 2018-09-19 DIAGNOSIS — Z466 Encounter for fitting and adjustment of urinary device: Secondary | ICD-10-CM | POA: Diagnosis not present

## 2018-09-19 DIAGNOSIS — I5033 Acute on chronic diastolic (congestive) heart failure: Secondary | ICD-10-CM | POA: Diagnosis not present

## 2018-09-19 DIAGNOSIS — S71102D Unspecified open wound, left thigh, subsequent encounter: Secondary | ICD-10-CM | POA: Diagnosis not present

## 2018-09-19 DIAGNOSIS — Z89512 Acquired absence of left leg below knee: Secondary | ICD-10-CM | POA: Diagnosis not present

## 2018-09-19 DIAGNOSIS — Z9981 Dependence on supplemental oxygen: Secondary | ICD-10-CM | POA: Diagnosis not present

## 2018-09-19 DIAGNOSIS — R1312 Dysphagia, oropharyngeal phase: Secondary | ICD-10-CM | POA: Diagnosis not present

## 2018-09-19 DIAGNOSIS — D638 Anemia in other chronic diseases classified elsewhere: Secondary | ICD-10-CM | POA: Diagnosis not present

## 2018-09-19 DIAGNOSIS — M059 Rheumatoid arthritis with rheumatoid factor, unspecified: Secondary | ICD-10-CM | POA: Diagnosis not present

## 2018-09-19 DIAGNOSIS — E785 Hyperlipidemia, unspecified: Secondary | ICD-10-CM | POA: Diagnosis not present

## 2018-09-19 DIAGNOSIS — S72002D Fracture of unspecified part of neck of left femur, subsequent encounter for closed fracture with routine healing: Secondary | ICD-10-CM | POA: Diagnosis not present

## 2018-09-19 DIAGNOSIS — I214 Non-ST elevation (NSTEMI) myocardial infarction: Secondary | ICD-10-CM | POA: Diagnosis not present

## 2018-09-19 DIAGNOSIS — K769 Liver disease, unspecified: Secondary | ICD-10-CM | POA: Diagnosis not present

## 2018-09-19 DIAGNOSIS — J439 Emphysema, unspecified: Secondary | ICD-10-CM | POA: Diagnosis not present

## 2018-09-21 DIAGNOSIS — I110 Hypertensive heart disease with heart failure: Secondary | ICD-10-CM | POA: Diagnosis not present

## 2018-09-21 DIAGNOSIS — D696 Thrombocytopenia, unspecified: Secondary | ICD-10-CM | POA: Diagnosis not present

## 2018-09-21 DIAGNOSIS — D638 Anemia in other chronic diseases classified elsewhere: Secondary | ICD-10-CM | POA: Diagnosis not present

## 2018-09-21 DIAGNOSIS — M1990 Unspecified osteoarthritis, unspecified site: Secondary | ICD-10-CM | POA: Diagnosis not present

## 2018-09-21 DIAGNOSIS — Z466 Encounter for fitting and adjustment of urinary device: Secondary | ICD-10-CM | POA: Diagnosis not present

## 2018-09-21 DIAGNOSIS — Z87891 Personal history of nicotine dependence: Secondary | ICD-10-CM | POA: Diagnosis not present

## 2018-09-21 DIAGNOSIS — R262 Difficulty in walking, not elsewhere classified: Secondary | ICD-10-CM | POA: Diagnosis not present

## 2018-09-21 DIAGNOSIS — I214 Non-ST elevation (NSTEMI) myocardial infarction: Secondary | ICD-10-CM | POA: Diagnosis not present

## 2018-09-21 DIAGNOSIS — K769 Liver disease, unspecified: Secondary | ICD-10-CM | POA: Diagnosis not present

## 2018-09-21 DIAGNOSIS — K219 Gastro-esophageal reflux disease without esophagitis: Secondary | ICD-10-CM | POA: Diagnosis not present

## 2018-09-21 DIAGNOSIS — I5033 Acute on chronic diastolic (congestive) heart failure: Secondary | ICD-10-CM | POA: Diagnosis not present

## 2018-09-21 DIAGNOSIS — J439 Emphysema, unspecified: Secondary | ICD-10-CM | POA: Diagnosis not present

## 2018-09-21 DIAGNOSIS — S71102D Unspecified open wound, left thigh, subsequent encounter: Secondary | ICD-10-CM | POA: Diagnosis not present

## 2018-09-21 DIAGNOSIS — E785 Hyperlipidemia, unspecified: Secondary | ICD-10-CM | POA: Diagnosis not present

## 2018-09-21 DIAGNOSIS — M059 Rheumatoid arthritis with rheumatoid factor, unspecified: Secondary | ICD-10-CM | POA: Diagnosis not present

## 2018-09-21 DIAGNOSIS — S72002D Fracture of unspecified part of neck of left femur, subsequent encounter for closed fracture with routine healing: Secondary | ICD-10-CM | POA: Diagnosis not present

## 2018-09-21 DIAGNOSIS — M6281 Muscle weakness (generalized): Secondary | ICD-10-CM | POA: Diagnosis not present

## 2018-09-21 DIAGNOSIS — I5020 Unspecified systolic (congestive) heart failure: Secondary | ICD-10-CM | POA: Diagnosis not present

## 2018-09-21 DIAGNOSIS — Z89512 Acquired absence of left leg below knee: Secondary | ICD-10-CM | POA: Diagnosis not present

## 2018-09-21 DIAGNOSIS — M069 Rheumatoid arthritis, unspecified: Secondary | ICD-10-CM | POA: Diagnosis not present

## 2018-09-21 DIAGNOSIS — R339 Retention of urine, unspecified: Secondary | ICD-10-CM | POA: Diagnosis not present

## 2018-09-21 DIAGNOSIS — Z9981 Dependence on supplemental oxygen: Secondary | ICD-10-CM | POA: Diagnosis not present

## 2018-09-21 DIAGNOSIS — I2510 Atherosclerotic heart disease of native coronary artery without angina pectoris: Secondary | ICD-10-CM | POA: Diagnosis not present

## 2018-09-21 DIAGNOSIS — M159 Polyosteoarthritis, unspecified: Secondary | ICD-10-CM | POA: Diagnosis not present

## 2018-09-21 DIAGNOSIS — J9621 Acute and chronic respiratory failure with hypoxia: Secondary | ICD-10-CM | POA: Diagnosis not present

## 2018-09-21 DIAGNOSIS — R1312 Dysphagia, oropharyngeal phase: Secondary | ICD-10-CM | POA: Diagnosis not present

## 2018-09-22 DIAGNOSIS — Z87891 Personal history of nicotine dependence: Secondary | ICD-10-CM | POA: Diagnosis not present

## 2018-09-22 DIAGNOSIS — M059 Rheumatoid arthritis with rheumatoid factor, unspecified: Secondary | ICD-10-CM | POA: Diagnosis not present

## 2018-09-22 DIAGNOSIS — Z466 Encounter for fitting and adjustment of urinary device: Secondary | ICD-10-CM | POA: Diagnosis not present

## 2018-09-22 DIAGNOSIS — D638 Anemia in other chronic diseases classified elsewhere: Secondary | ICD-10-CM | POA: Diagnosis not present

## 2018-09-22 DIAGNOSIS — Z89512 Acquired absence of left leg below knee: Secondary | ICD-10-CM | POA: Diagnosis not present

## 2018-09-22 DIAGNOSIS — D696 Thrombocytopenia, unspecified: Secondary | ICD-10-CM | POA: Diagnosis not present

## 2018-09-22 DIAGNOSIS — J9621 Acute and chronic respiratory failure with hypoxia: Secondary | ICD-10-CM | POA: Diagnosis not present

## 2018-09-22 DIAGNOSIS — S71102D Unspecified open wound, left thigh, subsequent encounter: Secondary | ICD-10-CM | POA: Diagnosis not present

## 2018-09-22 DIAGNOSIS — E785 Hyperlipidemia, unspecified: Secondary | ICD-10-CM | POA: Diagnosis not present

## 2018-09-22 DIAGNOSIS — I5020 Unspecified systolic (congestive) heart failure: Secondary | ICD-10-CM | POA: Diagnosis not present

## 2018-09-22 DIAGNOSIS — R1312 Dysphagia, oropharyngeal phase: Secondary | ICD-10-CM | POA: Diagnosis not present

## 2018-09-22 DIAGNOSIS — K219 Gastro-esophageal reflux disease without esophagitis: Secondary | ICD-10-CM | POA: Diagnosis not present

## 2018-09-22 DIAGNOSIS — R339 Retention of urine, unspecified: Secondary | ICD-10-CM | POA: Diagnosis not present

## 2018-09-22 DIAGNOSIS — M159 Polyosteoarthritis, unspecified: Secondary | ICD-10-CM | POA: Diagnosis not present

## 2018-09-22 DIAGNOSIS — I214 Non-ST elevation (NSTEMI) myocardial infarction: Secondary | ICD-10-CM | POA: Diagnosis not present

## 2018-09-22 DIAGNOSIS — S72002D Fracture of unspecified part of neck of left femur, subsequent encounter for closed fracture with routine healing: Secondary | ICD-10-CM | POA: Diagnosis not present

## 2018-09-22 DIAGNOSIS — J439 Emphysema, unspecified: Secondary | ICD-10-CM | POA: Diagnosis not present

## 2018-09-22 DIAGNOSIS — K769 Liver disease, unspecified: Secondary | ICD-10-CM | POA: Diagnosis not present

## 2018-09-22 DIAGNOSIS — I110 Hypertensive heart disease with heart failure: Secondary | ICD-10-CM | POA: Diagnosis not present

## 2018-09-22 DIAGNOSIS — I5033 Acute on chronic diastolic (congestive) heart failure: Secondary | ICD-10-CM | POA: Diagnosis not present

## 2018-09-22 DIAGNOSIS — Z9981 Dependence on supplemental oxygen: Secondary | ICD-10-CM | POA: Diagnosis not present

## 2018-09-22 DIAGNOSIS — I2510 Atherosclerotic heart disease of native coronary artery without angina pectoris: Secondary | ICD-10-CM | POA: Diagnosis not present

## 2018-09-24 DIAGNOSIS — R339 Retention of urine, unspecified: Secondary | ICD-10-CM | POA: Diagnosis not present

## 2018-09-24 DIAGNOSIS — D638 Anemia in other chronic diseases classified elsewhere: Secondary | ICD-10-CM | POA: Diagnosis not present

## 2018-09-24 DIAGNOSIS — K219 Gastro-esophageal reflux disease without esophagitis: Secondary | ICD-10-CM | POA: Diagnosis not present

## 2018-09-24 DIAGNOSIS — Z466 Encounter for fitting and adjustment of urinary device: Secondary | ICD-10-CM | POA: Diagnosis not present

## 2018-09-24 DIAGNOSIS — R1312 Dysphagia, oropharyngeal phase: Secondary | ICD-10-CM | POA: Diagnosis not present

## 2018-09-24 DIAGNOSIS — I110 Hypertensive heart disease with heart failure: Secondary | ICD-10-CM | POA: Diagnosis not present

## 2018-09-24 DIAGNOSIS — J9621 Acute and chronic respiratory failure with hypoxia: Secondary | ICD-10-CM | POA: Diagnosis not present

## 2018-09-24 DIAGNOSIS — J439 Emphysema, unspecified: Secondary | ICD-10-CM | POA: Diagnosis not present

## 2018-09-24 DIAGNOSIS — I214 Non-ST elevation (NSTEMI) myocardial infarction: Secondary | ICD-10-CM | POA: Diagnosis not present

## 2018-09-24 DIAGNOSIS — M159 Polyosteoarthritis, unspecified: Secondary | ICD-10-CM | POA: Diagnosis not present

## 2018-09-24 DIAGNOSIS — S71102D Unspecified open wound, left thigh, subsequent encounter: Secondary | ICD-10-CM | POA: Diagnosis not present

## 2018-09-24 DIAGNOSIS — Z87891 Personal history of nicotine dependence: Secondary | ICD-10-CM | POA: Diagnosis not present

## 2018-09-24 DIAGNOSIS — E785 Hyperlipidemia, unspecified: Secondary | ICD-10-CM | POA: Diagnosis not present

## 2018-09-24 DIAGNOSIS — I2510 Atherosclerotic heart disease of native coronary artery without angina pectoris: Secondary | ICD-10-CM | POA: Diagnosis not present

## 2018-09-24 DIAGNOSIS — Z89512 Acquired absence of left leg below knee: Secondary | ICD-10-CM | POA: Diagnosis not present

## 2018-09-24 DIAGNOSIS — D696 Thrombocytopenia, unspecified: Secondary | ICD-10-CM | POA: Diagnosis not present

## 2018-09-24 DIAGNOSIS — M059 Rheumatoid arthritis with rheumatoid factor, unspecified: Secondary | ICD-10-CM | POA: Diagnosis not present

## 2018-09-24 DIAGNOSIS — K769 Liver disease, unspecified: Secondary | ICD-10-CM | POA: Diagnosis not present

## 2018-09-24 DIAGNOSIS — Z9981 Dependence on supplemental oxygen: Secondary | ICD-10-CM | POA: Diagnosis not present

## 2018-09-24 DIAGNOSIS — I5020 Unspecified systolic (congestive) heart failure: Secondary | ICD-10-CM | POA: Diagnosis not present

## 2018-09-24 DIAGNOSIS — S72002D Fracture of unspecified part of neck of left femur, subsequent encounter for closed fracture with routine healing: Secondary | ICD-10-CM | POA: Diagnosis not present

## 2018-09-24 DIAGNOSIS — I5033 Acute on chronic diastolic (congestive) heart failure: Secondary | ICD-10-CM | POA: Diagnosis not present

## 2018-09-25 DIAGNOSIS — J9611 Chronic respiratory failure with hypoxia: Secondary | ICD-10-CM | POA: Diagnosis not present

## 2018-09-25 DIAGNOSIS — N390 Urinary tract infection, site not specified: Secondary | ICD-10-CM | POA: Diagnosis not present

## 2018-09-25 DIAGNOSIS — J309 Allergic rhinitis, unspecified: Secondary | ICD-10-CM | POA: Diagnosis not present

## 2018-09-25 DIAGNOSIS — I2510 Atherosclerotic heart disease of native coronary artery without angina pectoris: Secondary | ICD-10-CM | POA: Diagnosis not present

## 2018-09-25 DIAGNOSIS — E039 Hypothyroidism, unspecified: Secondary | ICD-10-CM | POA: Diagnosis not present

## 2018-09-28 DIAGNOSIS — I5020 Unspecified systolic (congestive) heart failure: Secondary | ICD-10-CM | POA: Diagnosis not present

## 2018-09-28 DIAGNOSIS — Z89512 Acquired absence of left leg below knee: Secondary | ICD-10-CM | POA: Diagnosis not present

## 2018-09-28 DIAGNOSIS — I110 Hypertensive heart disease with heart failure: Secondary | ICD-10-CM | POA: Diagnosis not present

## 2018-09-28 DIAGNOSIS — K769 Liver disease, unspecified: Secondary | ICD-10-CM | POA: Diagnosis not present

## 2018-09-28 DIAGNOSIS — E039 Hypothyroidism, unspecified: Secondary | ICD-10-CM | POA: Diagnosis not present

## 2018-09-28 DIAGNOSIS — D696 Thrombocytopenia, unspecified: Secondary | ICD-10-CM | POA: Diagnosis not present

## 2018-09-28 DIAGNOSIS — M159 Polyosteoarthritis, unspecified: Secondary | ICD-10-CM | POA: Diagnosis not present

## 2018-09-28 DIAGNOSIS — M059 Rheumatoid arthritis with rheumatoid factor, unspecified: Secondary | ICD-10-CM | POA: Diagnosis not present

## 2018-09-28 DIAGNOSIS — I5033 Acute on chronic diastolic (congestive) heart failure: Secondary | ICD-10-CM | POA: Diagnosis not present

## 2018-09-28 DIAGNOSIS — J439 Emphysema, unspecified: Secondary | ICD-10-CM | POA: Diagnosis not present

## 2018-09-28 DIAGNOSIS — R339 Retention of urine, unspecified: Secondary | ICD-10-CM | POA: Diagnosis not present

## 2018-09-28 DIAGNOSIS — J309 Allergic rhinitis, unspecified: Secondary | ICD-10-CM | POA: Diagnosis not present

## 2018-09-28 DIAGNOSIS — I214 Non-ST elevation (NSTEMI) myocardial infarction: Secondary | ICD-10-CM | POA: Diagnosis not present

## 2018-09-28 DIAGNOSIS — E785 Hyperlipidemia, unspecified: Secondary | ICD-10-CM | POA: Diagnosis not present

## 2018-09-28 DIAGNOSIS — S71102D Unspecified open wound, left thigh, subsequent encounter: Secondary | ICD-10-CM | POA: Diagnosis not present

## 2018-09-28 DIAGNOSIS — Z9981 Dependence on supplemental oxygen: Secondary | ICD-10-CM | POA: Diagnosis not present

## 2018-09-28 DIAGNOSIS — J9611 Chronic respiratory failure with hypoxia: Secondary | ICD-10-CM | POA: Diagnosis not present

## 2018-09-28 DIAGNOSIS — I2510 Atherosclerotic heart disease of native coronary artery without angina pectoris: Secondary | ICD-10-CM | POA: Diagnosis not present

## 2018-09-28 DIAGNOSIS — M069 Rheumatoid arthritis, unspecified: Secondary | ICD-10-CM | POA: Diagnosis not present

## 2018-09-28 DIAGNOSIS — Z466 Encounter for fitting and adjustment of urinary device: Secondary | ICD-10-CM | POA: Diagnosis not present

## 2018-09-28 DIAGNOSIS — K219 Gastro-esophageal reflux disease without esophagitis: Secondary | ICD-10-CM | POA: Diagnosis not present

## 2018-09-28 DIAGNOSIS — D638 Anemia in other chronic diseases classified elsewhere: Secondary | ICD-10-CM | POA: Diagnosis not present

## 2018-09-28 DIAGNOSIS — J9621 Acute and chronic respiratory failure with hypoxia: Secondary | ICD-10-CM | POA: Diagnosis not present

## 2018-09-28 DIAGNOSIS — Z87891 Personal history of nicotine dependence: Secondary | ICD-10-CM | POA: Diagnosis not present

## 2018-09-28 DIAGNOSIS — S72002D Fracture of unspecified part of neck of left femur, subsequent encounter for closed fracture with routine healing: Secondary | ICD-10-CM | POA: Diagnosis not present

## 2018-09-28 DIAGNOSIS — R1312 Dysphagia, oropharyngeal phase: Secondary | ICD-10-CM | POA: Diagnosis not present

## 2018-09-29 DIAGNOSIS — Z89512 Acquired absence of left leg below knee: Secondary | ICD-10-CM | POA: Diagnosis not present

## 2018-09-29 DIAGNOSIS — R1312 Dysphagia, oropharyngeal phase: Secondary | ICD-10-CM | POA: Diagnosis not present

## 2018-09-29 DIAGNOSIS — K219 Gastro-esophageal reflux disease without esophagitis: Secondary | ICD-10-CM | POA: Diagnosis not present

## 2018-09-29 DIAGNOSIS — D638 Anemia in other chronic diseases classified elsewhere: Secondary | ICD-10-CM | POA: Diagnosis not present

## 2018-09-29 DIAGNOSIS — I214 Non-ST elevation (NSTEMI) myocardial infarction: Secondary | ICD-10-CM | POA: Diagnosis not present

## 2018-09-29 DIAGNOSIS — I5020 Unspecified systolic (congestive) heart failure: Secondary | ICD-10-CM | POA: Diagnosis not present

## 2018-09-29 DIAGNOSIS — E785 Hyperlipidemia, unspecified: Secondary | ICD-10-CM | POA: Diagnosis not present

## 2018-09-29 DIAGNOSIS — R339 Retention of urine, unspecified: Secondary | ICD-10-CM | POA: Diagnosis not present

## 2018-09-29 DIAGNOSIS — J9621 Acute and chronic respiratory failure with hypoxia: Secondary | ICD-10-CM | POA: Diagnosis not present

## 2018-09-29 DIAGNOSIS — Z466 Encounter for fitting and adjustment of urinary device: Secondary | ICD-10-CM | POA: Diagnosis not present

## 2018-09-29 DIAGNOSIS — S71102D Unspecified open wound, left thigh, subsequent encounter: Secondary | ICD-10-CM | POA: Diagnosis not present

## 2018-09-29 DIAGNOSIS — Z87891 Personal history of nicotine dependence: Secondary | ICD-10-CM | POA: Diagnosis not present

## 2018-09-29 DIAGNOSIS — I110 Hypertensive heart disease with heart failure: Secondary | ICD-10-CM | POA: Diagnosis not present

## 2018-09-29 DIAGNOSIS — M059 Rheumatoid arthritis with rheumatoid factor, unspecified: Secondary | ICD-10-CM | POA: Diagnosis not present

## 2018-09-29 DIAGNOSIS — J439 Emphysema, unspecified: Secondary | ICD-10-CM | POA: Diagnosis not present

## 2018-09-29 DIAGNOSIS — D696 Thrombocytopenia, unspecified: Secondary | ICD-10-CM | POA: Diagnosis not present

## 2018-09-29 DIAGNOSIS — M159 Polyosteoarthritis, unspecified: Secondary | ICD-10-CM | POA: Diagnosis not present

## 2018-09-29 DIAGNOSIS — I5033 Acute on chronic diastolic (congestive) heart failure: Secondary | ICD-10-CM | POA: Diagnosis not present

## 2018-09-29 DIAGNOSIS — K769 Liver disease, unspecified: Secondary | ICD-10-CM | POA: Diagnosis not present

## 2018-09-29 DIAGNOSIS — S72002D Fracture of unspecified part of neck of left femur, subsequent encounter for closed fracture with routine healing: Secondary | ICD-10-CM | POA: Diagnosis not present

## 2018-09-29 DIAGNOSIS — Z9981 Dependence on supplemental oxygen: Secondary | ICD-10-CM | POA: Diagnosis not present

## 2018-09-29 DIAGNOSIS — I2510 Atherosclerotic heart disease of native coronary artery without angina pectoris: Secondary | ICD-10-CM | POA: Diagnosis not present

## 2018-09-30 DIAGNOSIS — J309 Allergic rhinitis, unspecified: Secondary | ICD-10-CM | POA: Diagnosis not present

## 2018-09-30 DIAGNOSIS — I2510 Atherosclerotic heart disease of native coronary artery without angina pectoris: Secondary | ICD-10-CM | POA: Diagnosis not present

## 2018-09-30 DIAGNOSIS — M159 Polyosteoarthritis, unspecified: Secondary | ICD-10-CM | POA: Diagnosis not present

## 2018-09-30 DIAGNOSIS — M069 Rheumatoid arthritis, unspecified: Secondary | ICD-10-CM | POA: Diagnosis not present

## 2018-10-01 DIAGNOSIS — I214 Non-ST elevation (NSTEMI) myocardial infarction: Secondary | ICD-10-CM | POA: Diagnosis not present

## 2018-10-01 DIAGNOSIS — E785 Hyperlipidemia, unspecified: Secondary | ICD-10-CM | POA: Diagnosis not present

## 2018-10-01 DIAGNOSIS — S71102D Unspecified open wound, left thigh, subsequent encounter: Secondary | ICD-10-CM | POA: Diagnosis not present

## 2018-10-01 DIAGNOSIS — K769 Liver disease, unspecified: Secondary | ICD-10-CM | POA: Diagnosis not present

## 2018-10-01 DIAGNOSIS — J439 Emphysema, unspecified: Secondary | ICD-10-CM | POA: Diagnosis not present

## 2018-10-01 DIAGNOSIS — D638 Anemia in other chronic diseases classified elsewhere: Secondary | ICD-10-CM | POA: Diagnosis not present

## 2018-10-01 DIAGNOSIS — K219 Gastro-esophageal reflux disease without esophagitis: Secondary | ICD-10-CM | POA: Diagnosis not present

## 2018-10-01 DIAGNOSIS — Z87891 Personal history of nicotine dependence: Secondary | ICD-10-CM | POA: Diagnosis not present

## 2018-10-01 DIAGNOSIS — J9621 Acute and chronic respiratory failure with hypoxia: Secondary | ICD-10-CM | POA: Diagnosis not present

## 2018-10-01 DIAGNOSIS — Z466 Encounter for fitting and adjustment of urinary device: Secondary | ICD-10-CM | POA: Diagnosis not present

## 2018-10-01 DIAGNOSIS — Z9981 Dependence on supplemental oxygen: Secondary | ICD-10-CM | POA: Diagnosis not present

## 2018-10-01 DIAGNOSIS — M059 Rheumatoid arthritis with rheumatoid factor, unspecified: Secondary | ICD-10-CM | POA: Diagnosis not present

## 2018-10-01 DIAGNOSIS — R339 Retention of urine, unspecified: Secondary | ICD-10-CM | POA: Diagnosis not present

## 2018-10-01 DIAGNOSIS — D696 Thrombocytopenia, unspecified: Secondary | ICD-10-CM | POA: Diagnosis not present

## 2018-10-01 DIAGNOSIS — I5033 Acute on chronic diastolic (congestive) heart failure: Secondary | ICD-10-CM | POA: Diagnosis not present

## 2018-10-01 DIAGNOSIS — I2510 Atherosclerotic heart disease of native coronary artery without angina pectoris: Secondary | ICD-10-CM | POA: Diagnosis not present

## 2018-10-01 DIAGNOSIS — R1312 Dysphagia, oropharyngeal phase: Secondary | ICD-10-CM | POA: Diagnosis not present

## 2018-10-01 DIAGNOSIS — M159 Polyosteoarthritis, unspecified: Secondary | ICD-10-CM | POA: Diagnosis not present

## 2018-10-01 DIAGNOSIS — I110 Hypertensive heart disease with heart failure: Secondary | ICD-10-CM | POA: Diagnosis not present

## 2018-10-01 DIAGNOSIS — I5020 Unspecified systolic (congestive) heart failure: Secondary | ICD-10-CM | POA: Diagnosis not present

## 2018-10-01 DIAGNOSIS — Z89512 Acquired absence of left leg below knee: Secondary | ICD-10-CM | POA: Diagnosis not present

## 2018-10-01 DIAGNOSIS — S72002D Fracture of unspecified part of neck of left femur, subsequent encounter for closed fracture with routine healing: Secondary | ICD-10-CM | POA: Diagnosis not present

## 2018-10-02 DIAGNOSIS — Z89512 Acquired absence of left leg below knee: Secondary | ICD-10-CM | POA: Diagnosis not present

## 2018-10-02 DIAGNOSIS — M159 Polyosteoarthritis, unspecified: Secondary | ICD-10-CM | POA: Diagnosis not present

## 2018-10-02 DIAGNOSIS — R339 Retention of urine, unspecified: Secondary | ICD-10-CM | POA: Diagnosis not present

## 2018-10-02 DIAGNOSIS — I110 Hypertensive heart disease with heart failure: Secondary | ICD-10-CM | POA: Diagnosis not present

## 2018-10-02 DIAGNOSIS — S81802A Unspecified open wound, left lower leg, initial encounter: Secondary | ICD-10-CM | POA: Diagnosis not present

## 2018-10-02 DIAGNOSIS — K219 Gastro-esophageal reflux disease without esophagitis: Secondary | ICD-10-CM | POA: Diagnosis not present

## 2018-10-02 DIAGNOSIS — S72002D Fracture of unspecified part of neck of left femur, subsequent encounter for closed fracture with routine healing: Secondary | ICD-10-CM | POA: Diagnosis not present

## 2018-10-02 DIAGNOSIS — Z9981 Dependence on supplemental oxygen: Secondary | ICD-10-CM | POA: Diagnosis not present

## 2018-10-02 DIAGNOSIS — Z87891 Personal history of nicotine dependence: Secondary | ICD-10-CM | POA: Diagnosis not present

## 2018-10-02 DIAGNOSIS — R1312 Dysphagia, oropharyngeal phase: Secondary | ICD-10-CM | POA: Diagnosis not present

## 2018-10-02 DIAGNOSIS — M059 Rheumatoid arthritis with rheumatoid factor, unspecified: Secondary | ICD-10-CM | POA: Diagnosis not present

## 2018-10-02 DIAGNOSIS — K769 Liver disease, unspecified: Secondary | ICD-10-CM | POA: Diagnosis not present

## 2018-10-02 DIAGNOSIS — J439 Emphysema, unspecified: Secondary | ICD-10-CM | POA: Diagnosis not present

## 2018-10-02 DIAGNOSIS — I214 Non-ST elevation (NSTEMI) myocardial infarction: Secondary | ICD-10-CM | POA: Diagnosis not present

## 2018-10-02 DIAGNOSIS — D638 Anemia in other chronic diseases classified elsewhere: Secondary | ICD-10-CM | POA: Diagnosis not present

## 2018-10-02 DIAGNOSIS — I5020 Unspecified systolic (congestive) heart failure: Secondary | ICD-10-CM | POA: Diagnosis not present

## 2018-10-02 DIAGNOSIS — S71102D Unspecified open wound, left thigh, subsequent encounter: Secondary | ICD-10-CM | POA: Diagnosis not present

## 2018-10-02 DIAGNOSIS — D696 Thrombocytopenia, unspecified: Secondary | ICD-10-CM | POA: Diagnosis not present

## 2018-10-02 DIAGNOSIS — J9621 Acute and chronic respiratory failure with hypoxia: Secondary | ICD-10-CM | POA: Diagnosis not present

## 2018-10-02 DIAGNOSIS — I2510 Atherosclerotic heart disease of native coronary artery without angina pectoris: Secondary | ICD-10-CM | POA: Diagnosis not present

## 2018-10-02 DIAGNOSIS — E785 Hyperlipidemia, unspecified: Secondary | ICD-10-CM | POA: Diagnosis not present

## 2018-10-02 DIAGNOSIS — J9611 Chronic respiratory failure with hypoxia: Secondary | ICD-10-CM | POA: Diagnosis not present

## 2018-10-02 DIAGNOSIS — Z466 Encounter for fitting and adjustment of urinary device: Secondary | ICD-10-CM | POA: Diagnosis not present

## 2018-10-02 DIAGNOSIS — J309 Allergic rhinitis, unspecified: Secondary | ICD-10-CM | POA: Diagnosis not present

## 2018-10-02 DIAGNOSIS — I5033 Acute on chronic diastolic (congestive) heart failure: Secondary | ICD-10-CM | POA: Diagnosis not present

## 2018-10-02 DIAGNOSIS — M069 Rheumatoid arthritis, unspecified: Secondary | ICD-10-CM | POA: Diagnosis not present

## 2018-10-04 DIAGNOSIS — Z7982 Long term (current) use of aspirin: Secondary | ICD-10-CM | POA: Diagnosis not present

## 2018-10-04 DIAGNOSIS — S00512A Abrasion of oral cavity, initial encounter: Secondary | ICD-10-CM | POA: Diagnosis not present

## 2018-10-04 DIAGNOSIS — J449 Chronic obstructive pulmonary disease, unspecified: Secondary | ICD-10-CM | POA: Diagnosis not present

## 2018-10-04 DIAGNOSIS — B9689 Other specified bacterial agents as the cause of diseases classified elsewhere: Secondary | ICD-10-CM | POA: Diagnosis not present

## 2018-10-04 DIAGNOSIS — J329 Chronic sinusitis, unspecified: Secondary | ICD-10-CM | POA: Diagnosis not present

## 2018-10-04 DIAGNOSIS — R402412 Glasgow coma scale score 13-15, at arrival to emergency department: Secondary | ICD-10-CM | POA: Diagnosis not present

## 2018-10-04 DIAGNOSIS — Z8673 Personal history of transient ischemic attack (TIA), and cerebral infarction without residual deficits: Secondary | ICD-10-CM | POA: Diagnosis not present

## 2018-10-04 DIAGNOSIS — Z79891 Long term (current) use of opiate analgesic: Secondary | ICD-10-CM | POA: Diagnosis not present

## 2018-10-04 DIAGNOSIS — I2510 Atherosclerotic heart disease of native coronary artery without angina pectoris: Secondary | ICD-10-CM | POA: Diagnosis not present

## 2018-10-04 DIAGNOSIS — Z79899 Other long term (current) drug therapy: Secondary | ICD-10-CM | POA: Diagnosis not present

## 2018-10-04 DIAGNOSIS — R569 Unspecified convulsions: Secondary | ICD-10-CM | POA: Diagnosis not present

## 2018-10-04 DIAGNOSIS — I10 Essential (primary) hypertension: Secondary | ICD-10-CM | POA: Diagnosis not present

## 2018-10-04 DIAGNOSIS — M069 Rheumatoid arthritis, unspecified: Secondary | ICD-10-CM | POA: Diagnosis not present

## 2018-10-04 DIAGNOSIS — I252 Old myocardial infarction: Secondary | ICD-10-CM | POA: Diagnosis not present

## 2018-10-04 DIAGNOSIS — J0130 Acute sphenoidal sinusitis, unspecified: Secondary | ICD-10-CM | POA: Diagnosis not present

## 2018-10-05 ENCOUNTER — Inpatient Hospital Stay (HOSPITAL_COMMUNITY)
Admission: EM | Admit: 2018-10-05 | Discharge: 2018-10-14 | DRG: 474 | Disposition: A | Discharge status: Skilled Nursing Facility | Payer: Medicare Other | Attending: Internal Medicine | Admitting: Internal Medicine

## 2018-10-05 ENCOUNTER — Encounter (HOSPITAL_COMMUNITY): Payer: Self-pay

## 2018-10-05 ENCOUNTER — Emergency Department (HOSPITAL_COMMUNITY): Payer: Medicare Other

## 2018-10-05 ENCOUNTER — Other Ambulatory Visit: Payer: Self-pay

## 2018-10-05 DIAGNOSIS — I252 Old myocardial infarction: Secondary | ICD-10-CM | POA: Diagnosis not present

## 2018-10-05 DIAGNOSIS — L98429 Non-pressure chronic ulcer of back with unspecified severity: Secondary | ICD-10-CM | POA: Diagnosis not present

## 2018-10-05 DIAGNOSIS — L89899 Pressure ulcer of other site, unspecified stage: Secondary | ICD-10-CM | POA: Diagnosis not present

## 2018-10-05 DIAGNOSIS — J8410 Pulmonary fibrosis, unspecified: Secondary | ICD-10-CM | POA: Diagnosis not present

## 2018-10-05 DIAGNOSIS — M159 Polyosteoarthritis, unspecified: Secondary | ICD-10-CM | POA: Diagnosis not present

## 2018-10-05 DIAGNOSIS — M8668 Other chronic osteomyelitis, other site: Secondary | ICD-10-CM | POA: Diagnosis not present

## 2018-10-05 DIAGNOSIS — G9340 Encephalopathy, unspecified: Secondary | ICD-10-CM | POA: Diagnosis not present

## 2018-10-05 DIAGNOSIS — T8789 Other complications of amputation stump: Secondary | ICD-10-CM | POA: Diagnosis not present

## 2018-10-05 DIAGNOSIS — Z91041 Radiographic dye allergy status: Secondary | ICD-10-CM | POA: Diagnosis not present

## 2018-10-05 DIAGNOSIS — Z955 Presence of coronary angioplasty implant and graft: Secondary | ICD-10-CM | POA: Diagnosis not present

## 2018-10-05 DIAGNOSIS — D649 Anemia, unspecified: Secondary | ICD-10-CM | POA: Diagnosis not present

## 2018-10-05 DIAGNOSIS — E876 Hypokalemia: Secondary | ICD-10-CM | POA: Diagnosis not present

## 2018-10-05 DIAGNOSIS — Z1159 Encounter for screening for other viral diseases: Secondary | ICD-10-CM | POA: Diagnosis not present

## 2018-10-05 DIAGNOSIS — Z7982 Long term (current) use of aspirin: Secondary | ICD-10-CM

## 2018-10-05 DIAGNOSIS — Z681 Body mass index (BMI) 19 or less, adult: Secondary | ICD-10-CM | POA: Diagnosis not present

## 2018-10-05 DIAGNOSIS — G9389 Other specified disorders of brain: Secondary | ICD-10-CM | POA: Diagnosis not present

## 2018-10-05 DIAGNOSIS — K5790 Diverticulosis of intestine, part unspecified, without perforation or abscess without bleeding: Secondary | ICD-10-CM | POA: Diagnosis present

## 2018-10-05 DIAGNOSIS — S78112A Complete traumatic amputation at level between left hip and knee, initial encounter: Secondary | ICD-10-CM | POA: Diagnosis present

## 2018-10-05 DIAGNOSIS — R5381 Other malaise: Secondary | ICD-10-CM | POA: Diagnosis not present

## 2018-10-05 DIAGNOSIS — E039 Hypothyroidism, unspecified: Secondary | ICD-10-CM | POA: Diagnosis not present

## 2018-10-05 DIAGNOSIS — E785 Hyperlipidemia, unspecified: Secondary | ICD-10-CM | POA: Diagnosis not present

## 2018-10-05 DIAGNOSIS — R4182 Altered mental status, unspecified: Secondary | ICD-10-CM | POA: Diagnosis present

## 2018-10-05 DIAGNOSIS — I361 Nonrheumatic tricuspid (valve) insufficiency: Secondary | ICD-10-CM | POA: Diagnosis not present

## 2018-10-05 DIAGNOSIS — K7290 Hepatic failure, unspecified without coma: Secondary | ICD-10-CM | POA: Diagnosis present

## 2018-10-05 DIAGNOSIS — K219 Gastro-esophageal reflux disease without esophagitis: Secondary | ICD-10-CM | POA: Diagnosis present

## 2018-10-05 DIAGNOSIS — M86462 Chronic osteomyelitis with draining sinus, left tibia and fibula: Secondary | ICD-10-CM | POA: Diagnosis not present

## 2018-10-05 DIAGNOSIS — F8089 Other developmental disorders of speech and language: Secondary | ICD-10-CM | POA: Diagnosis not present

## 2018-10-05 DIAGNOSIS — M2550 Pain in unspecified joint: Secondary | ICD-10-CM | POA: Diagnosis not present

## 2018-10-05 DIAGNOSIS — G40409 Other generalized epilepsy and epileptic syndromes, not intractable, without status epilepticus: Secondary | ICD-10-CM | POA: Diagnosis present

## 2018-10-05 DIAGNOSIS — Z4781 Encounter for orthopedic aftercare following surgical amputation: Secondary | ICD-10-CM | POA: Diagnosis not present

## 2018-10-05 DIAGNOSIS — D509 Iron deficiency anemia, unspecified: Secondary | ICD-10-CM | POA: Diagnosis not present

## 2018-10-05 DIAGNOSIS — I2510 Atherosclerotic heart disease of native coronary artery without angina pectoris: Secondary | ICD-10-CM | POA: Diagnosis present

## 2018-10-05 DIAGNOSIS — R339 Retention of urine, unspecified: Secondary | ICD-10-CM | POA: Diagnosis not present

## 2018-10-05 DIAGNOSIS — J439 Emphysema, unspecified: Secondary | ICD-10-CM | POA: Diagnosis not present

## 2018-10-05 DIAGNOSIS — S71102D Unspecified open wound, left thigh, subsequent encounter: Secondary | ICD-10-CM | POA: Diagnosis not present

## 2018-10-05 DIAGNOSIS — R410 Disorientation, unspecified: Secondary | ICD-10-CM | POA: Diagnosis not present

## 2018-10-05 DIAGNOSIS — M069 Rheumatoid arthritis, unspecified: Secondary | ICD-10-CM | POA: Diagnosis not present

## 2018-10-05 DIAGNOSIS — Z741 Need for assistance with personal care: Secondary | ICD-10-CM | POA: Diagnosis not present

## 2018-10-05 DIAGNOSIS — Z8673 Personal history of transient ischemic attack (TIA), and cerebral infarction without residual deficits: Secondary | ICD-10-CM

## 2018-10-05 DIAGNOSIS — F05 Delirium due to known physiological condition: Secondary | ICD-10-CM | POA: Diagnosis present

## 2018-10-05 DIAGNOSIS — R2689 Other abnormalities of gait and mobility: Secondary | ICD-10-CM | POA: Diagnosis not present

## 2018-10-05 DIAGNOSIS — Z66 Do not resuscitate: Secondary | ICD-10-CM | POA: Diagnosis present

## 2018-10-05 DIAGNOSIS — R1310 Dysphagia, unspecified: Secondary | ICD-10-CM | POA: Diagnosis not present

## 2018-10-05 DIAGNOSIS — L03116 Cellulitis of left lower limb: Secondary | ICD-10-CM | POA: Diagnosis present

## 2018-10-05 DIAGNOSIS — L89153 Pressure ulcer of sacral region, stage 3: Secondary | ICD-10-CM | POA: Diagnosis present

## 2018-10-05 DIAGNOSIS — I110 Hypertensive heart disease with heart failure: Secondary | ICD-10-CM | POA: Diagnosis not present

## 2018-10-05 DIAGNOSIS — Z89612 Acquired absence of left leg above knee: Secondary | ICD-10-CM | POA: Diagnosis not present

## 2018-10-05 DIAGNOSIS — I959 Hypotension, unspecified: Secondary | ICD-10-CM | POA: Diagnosis present

## 2018-10-05 DIAGNOSIS — T8744 Infection of amputation stump, left lower extremity: Principal | ICD-10-CM | POA: Diagnosis present

## 2018-10-05 DIAGNOSIS — K769 Liver disease, unspecified: Secondary | ICD-10-CM | POA: Diagnosis not present

## 2018-10-05 DIAGNOSIS — M869 Osteomyelitis, unspecified: Secondary | ICD-10-CM | POA: Diagnosis not present

## 2018-10-05 DIAGNOSIS — D696 Thrombocytopenia, unspecified: Secondary | ICD-10-CM | POA: Diagnosis not present

## 2018-10-05 DIAGNOSIS — M24562 Contracture, left knee: Secondary | ICD-10-CM | POA: Diagnosis present

## 2018-10-05 DIAGNOSIS — I4581 Long QT syndrome: Secondary | ICD-10-CM | POA: Diagnosis not present

## 2018-10-05 DIAGNOSIS — E43 Unspecified severe protein-calorie malnutrition: Secondary | ICD-10-CM | POA: Diagnosis not present

## 2018-10-05 DIAGNOSIS — I5020 Unspecified systolic (congestive) heart failure: Secondary | ICD-10-CM | POA: Diagnosis not present

## 2018-10-05 DIAGNOSIS — M1611 Unilateral primary osteoarthritis, right hip: Secondary | ICD-10-CM | POA: Diagnosis not present

## 2018-10-05 DIAGNOSIS — D631 Anemia in chronic kidney disease: Secondary | ICD-10-CM | POA: Diagnosis present

## 2018-10-05 DIAGNOSIS — E46 Unspecified protein-calorie malnutrition: Secondary | ICD-10-CM | POA: Diagnosis not present

## 2018-10-05 DIAGNOSIS — E7800 Pure hypercholesterolemia, unspecified: Secondary | ICD-10-CM | POA: Diagnosis not present

## 2018-10-05 DIAGNOSIS — G4700 Insomnia, unspecified: Secondary | ICD-10-CM | POA: Diagnosis present

## 2018-10-05 DIAGNOSIS — M059 Rheumatoid arthritis with rheumatoid factor, unspecified: Secondary | ICD-10-CM | POA: Diagnosis not present

## 2018-10-05 DIAGNOSIS — I10 Essential (primary) hypertension: Secondary | ICD-10-CM | POA: Diagnosis not present

## 2018-10-05 DIAGNOSIS — G92 Toxic encephalopathy: Secondary | ICD-10-CM | POA: Diagnosis not present

## 2018-10-05 DIAGNOSIS — Z87891 Personal history of nicotine dependence: Secondary | ICD-10-CM | POA: Diagnosis not present

## 2018-10-05 DIAGNOSIS — F0150 Vascular dementia without behavioral disturbance: Secondary | ICD-10-CM | POA: Diagnosis present

## 2018-10-05 DIAGNOSIS — Z89512 Acquired absence of left leg below knee: Secondary | ICD-10-CM | POA: Diagnosis not present

## 2018-10-05 DIAGNOSIS — J9611 Chronic respiratory failure with hypoxia: Secondary | ICD-10-CM | POA: Diagnosis present

## 2018-10-05 DIAGNOSIS — I5033 Acute on chronic diastolic (congestive) heart failure: Secondary | ICD-10-CM | POA: Diagnosis not present

## 2018-10-05 DIAGNOSIS — I214 Non-ST elevation (NSTEMI) myocardial infarction: Secondary | ICD-10-CM | POA: Diagnosis not present

## 2018-10-05 DIAGNOSIS — M810 Age-related osteoporosis without current pathological fracture: Secondary | ICD-10-CM | POA: Diagnosis present

## 2018-10-05 DIAGNOSIS — L89154 Pressure ulcer of sacral region, stage 4: Secondary | ICD-10-CM | POA: Diagnosis not present

## 2018-10-05 DIAGNOSIS — J449 Chronic obstructive pulmonary disease, unspecified: Secondary | ICD-10-CM | POA: Diagnosis present

## 2018-10-05 DIAGNOSIS — R4020 Unspecified coma: Secondary | ICD-10-CM | POA: Diagnosis not present

## 2018-10-05 DIAGNOSIS — R1312 Dysphagia, oropharyngeal phase: Secondary | ICD-10-CM | POA: Diagnosis not present

## 2018-10-05 DIAGNOSIS — J9621 Acute and chronic respiratory failure with hypoxia: Secondary | ICD-10-CM | POA: Diagnosis not present

## 2018-10-05 DIAGNOSIS — S72002D Fracture of unspecified part of neck of left femur, subsequent encounter for closed fracture with routine healing: Secondary | ICD-10-CM | POA: Diagnosis not present

## 2018-10-05 DIAGNOSIS — I340 Nonrheumatic mitral (valve) insufficiency: Secondary | ICD-10-CM | POA: Diagnosis not present

## 2018-10-05 DIAGNOSIS — Z9981 Dependence on supplemental oxygen: Secondary | ICD-10-CM | POA: Diagnosis not present

## 2018-10-05 DIAGNOSIS — Z7401 Bed confinement status: Secondary | ICD-10-CM | POA: Diagnosis not present

## 2018-10-05 DIAGNOSIS — L89159 Pressure ulcer of sacral region, unspecified stage: Secondary | ICD-10-CM | POA: Diagnosis not present

## 2018-10-05 DIAGNOSIS — M6281 Muscle weakness (generalized): Secondary | ICD-10-CM | POA: Diagnosis not present

## 2018-10-05 DIAGNOSIS — A419 Sepsis, unspecified organism: Secondary | ICD-10-CM

## 2018-10-05 DIAGNOSIS — Z466 Encounter for fitting and adjustment of urinary device: Secondary | ICD-10-CM | POA: Diagnosis not present

## 2018-10-05 DIAGNOSIS — D638 Anemia in other chronic diseases classified elsewhere: Secondary | ICD-10-CM | POA: Diagnosis not present

## 2018-10-05 LAB — CBC WITH DIFFERENTIAL/PLATELET
Abs Immature Granulocytes: 0.01 10*3/uL (ref 0.00–0.07)
Basophils Absolute: 0 10*3/uL (ref 0.0–0.1)
Basophils Relative: 1 %
Eosinophils Absolute: 0.1 10*3/uL (ref 0.0–0.5)
Eosinophils Relative: 3 %
HCT: 38.3 % (ref 36.0–46.0)
Hemoglobin: 11.7 g/dL — ABNORMAL LOW (ref 12.0–15.0)
Immature Granulocytes: 0 %
Lymphocytes Relative: 9 %
Lymphs Abs: 0.3 10*3/uL — ABNORMAL LOW (ref 0.7–4.0)
MCH: 28.1 pg (ref 26.0–34.0)
MCHC: 30.5 g/dL (ref 30.0–36.0)
MCV: 92.1 fL (ref 80.0–100.0)
Monocytes Absolute: 0.3 10*3/uL (ref 0.1–1.0)
Monocytes Relative: 8 %
Neutro Abs: 2.8 10*3/uL (ref 1.7–7.7)
Neutrophils Relative %: 79 %
Platelets: 197 10*3/uL (ref 150–400)
RBC: 4.16 MIL/uL (ref 3.87–5.11)
RDW: 16.2 % — ABNORMAL HIGH (ref 11.5–15.5)
WBC: 3.6 10*3/uL — ABNORMAL LOW (ref 4.0–10.5)
nRBC: 0 % (ref 0.0–0.2)

## 2018-10-05 LAB — TYPE AND SCREEN
ABO/RH(D): O POS
Antibody Screen: NEGATIVE

## 2018-10-05 LAB — COMPREHENSIVE METABOLIC PANEL
ALT: 8 U/L (ref 0–44)
AST: 15 U/L (ref 15–41)
Albumin: 2.3 g/dL — ABNORMAL LOW (ref 3.5–5.0)
Alkaline Phosphatase: 90 U/L (ref 38–126)
Anion gap: 10 (ref 5–15)
BUN: 14 mg/dL (ref 8–23)
CO2: 30 mmol/L (ref 22–32)
Calcium: 8.7 mg/dL — ABNORMAL LOW (ref 8.9–10.3)
Chloride: 98 mmol/L (ref 98–111)
Creatinine, Ser: 0.78 mg/dL (ref 0.44–1.00)
GFR calc Af Amer: >60 mL/min (ref >60)
GFR calc non Af Amer: >60 mL/min (ref >60)
Glucose, Bld: 86 mg/dL (ref 70–99)
Potassium: 4.2 mmol/L (ref 3.5–5.1)
Sodium: 138 mmol/L (ref 135–145)
Total Bilirubin: 0.8 mg/dL (ref 0.3–1.2)
Total Protein: 7.3 g/dL (ref 6.5–8.1)

## 2018-10-05 LAB — URINALYSIS, ROUTINE W REFLEX MICROSCOPIC
Bilirubin Urine: NEGATIVE
Glucose, UA: NEGATIVE mg/dL
Hgb urine dipstick: NEGATIVE
Ketones, ur: 5 mg/dL — AB
Nitrite: NEGATIVE
Protein, ur: NEGATIVE mg/dL
Specific Gravity, Urine: 1.016 (ref 1.005–1.030)
pH: 7 (ref 5.0–8.0)

## 2018-10-05 LAB — LACTIC ACID, PLASMA
Lactic Acid, Venous: 1.3 mmol/L (ref 0.5–1.9)
Lactic Acid, Venous: 0.9 mmol/L (ref 0.5–1.9)

## 2018-10-05 LAB — PROTIME-INR
INR: 1.1 (ref 0.8–1.2)
Prothrombin Time: 14.1 seconds (ref 11.4–15.2)

## 2018-10-05 LAB — ABO/RH: ABO/RH(D): O POS

## 2018-10-05 LAB — APTT: aPTT: 36 seconds (ref 24–36)

## 2018-10-05 LAB — PROCALCITONIN: Procalcitonin: <0.1 ng/mL

## 2018-10-05 MED ORDER — ACETAMINOPHEN 325 MG PO TABS: 650.0000 mg | ORAL_TABLET | Freq: Four times a day (QID) | ORAL | Status: DC | PRN

## 2018-10-05 MED ORDER — ASPIRIN EC 81 MG PO TBEC
81.0000 mg | DELAYED_RELEASE_TABLET | Freq: Every day | ORAL | Status: DC
  Administered 2018-10-06 – 2018-10-14 (×5): 81 mg via ORAL
  Filled 2018-10-05 (×7): qty 1

## 2018-10-05 MED ORDER — SODIUM CHLORIDE 0.9 % IV SOLN: 2.0000 g | Freq: Two times a day (BID) | INTRAVENOUS | Status: DC

## 2018-10-05 MED ORDER — LEVETIRACETAM 500 MG PO TABS
500.0000 mg | ORAL_TABLET | Freq: Two times a day (BID) | ORAL | Status: DC
  Administered 2018-10-06 (×3): 500 mg via ORAL
  Filled 2018-10-05 (×4): qty 1

## 2018-10-05 MED ORDER — ACETAMINOPHEN 650 MG RE SUPP: 650.0000 mg | Freq: Four times a day (QID) | RECTAL | Status: DC | PRN

## 2018-10-05 MED ORDER — VANCOMYCIN HCL 10 G IV SOLR
1250.0000 mg | Freq: Once | INTRAVENOUS | Status: AC
  Administered 2018-10-05: 1250 mg via INTRAVENOUS
  Filled 2018-10-05: qty 1250

## 2018-10-05 MED ORDER — RANOLAZINE ER 500 MG PO TB12
500.0000 mg | ORAL_TABLET | Freq: Two times a day (BID) | ORAL | Status: DC
  Administered 2018-10-06 – 2018-10-14 (×10): 500 mg via ORAL
  Filled 2018-10-05 (×17): qty 1

## 2018-10-05 MED ORDER — ROSUVASTATIN CALCIUM 5 MG PO TABS
10.0000 mg | ORAL_TABLET | Freq: Every day | ORAL | Status: DC
  Administered 2018-10-06 – 2018-10-13 (×4): 10 mg via ORAL
  Filled 2018-10-05 (×5): qty 2

## 2018-10-05 MED ORDER — VANCOMYCIN HCL IN DEXTROSE 1-5 GM/200ML-% IV SOLN
1000.0000 mg | Freq: Once | INTRAVENOUS | Status: DC
  Filled 2018-10-05: qty 200

## 2018-10-05 MED ORDER — LACTATED RINGERS IV BOLUS (SEPSIS)
1000.0000 mL | Freq: Once | INTRAVENOUS | Status: AC
  Administered 2018-10-05: 1000 mL via INTRAVENOUS

## 2018-10-05 MED ORDER — OXYCODONE-ACETAMINOPHEN 5-325 MG PO TABS
2.0000 | ORAL_TABLET | Freq: Once | ORAL | Status: AC
  Administered 2018-10-05: 2 via ORAL
  Filled 2018-10-05: qty 2

## 2018-10-05 MED ORDER — ENOXAPARIN SODIUM 40 MG/0.4ML ~~LOC~~ SOLN
40.0000 mg | Freq: Every day | SUBCUTANEOUS | Status: DC
  Administered 2018-10-06 – 2018-10-07 (×2): 40 mg via SUBCUTANEOUS
  Filled 2018-10-05 (×2): qty 0.4

## 2018-10-05 MED ORDER — DULOXETINE HCL 60 MG PO CPEP
60.0000 mg | ORAL_CAPSULE | Freq: Every day | ORAL | Status: DC
  Administered 2018-10-06 – 2018-10-14 (×5): 60 mg via ORAL
  Filled 2018-10-05 (×7): qty 1

## 2018-10-05 MED ORDER — SODIUM CHLORIDE 0.9 % IV SOLN
2.0000 g | Freq: Once | INTRAVENOUS | Status: AC
  Administered 2018-10-05: 2 g via INTRAVENOUS
  Filled 2018-10-05: qty 2
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
## 2018-10-05 NOTE — ED Notes (Signed)
Pt reports pain to R hip. RN notified Kristeen Mans MD who is reviewing the chart, no new orders at this time.

## 2018-10-05 NOTE — Progress Notes (Signed)
Pharmacy Antibiotic Note  Whitney Steele is a 73 y.o. female admitted on 10/05/2018 with sepsis, UTI and wound infection.  Pharmacy has been consulted for vancomycin/cefepime dosing. Afebrile, WBC 3.6, LA 1.3. SCr 0.78 on admit, CrCl~55.  Plan: Cefepime 2g IV x 1; then 2g IV q12h Vancomycin 1250mg  IV x1; then Vancomycin 1000 mg IV Q 24 hrs. Goal AUC 400-550. Expected AUC: 504 SCr used: 0.8 Monitor clinical progress, c/s, renal function F/u de-escalation plan/LOT, vancomycin trough as indicated   Height: 5\' 4"  (162.6 cm) Weight: 120 lb (54.4 kg) IBW/kg (Calculated) : 54.7  Temp (24hrs), Avg:97.9 F (36.6 C), Min:97.9 F (36.6 C), Max:97.9 F (36.6 C)  No results for input(s): WBC, CREATININE, LATICACIDVEN, VANCOTROUGH, VANCOPEAK, VANCORANDOM, GENTTROUGH, GENTPEAK, GENTRANDOM, TOBRATROUGH, TOBRAPEAK, TOBRARND, AMIKACINPEAK, AMIKACINTROU, AMIKACIN in the last 168 hours.  CrCl cannot be calculated (No successful lab value found.).    Allergies  Allergen Reactions  . Iodinated Diagnostic Agents Hives  . Tape Other (See Comments)    Tears skin - please use paper tape    Antimicrobials this admission: 4/27 vancomycin >>  4/27 cefepime >>   Dose adjustments this admission:   Microbiology results:   Babs Bertin, PharmD, BCPS Clinical Pharmacist 10/05/2018 3:44 PM
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
## 2018-10-06 ENCOUNTER — Inpatient Hospital Stay (HOSPITAL_COMMUNITY): Payer: Medicare Other

## 2018-10-06 DIAGNOSIS — L89153 Pressure ulcer of sacral region, stage 3: Secondary | ICD-10-CM

## 2018-10-06 DIAGNOSIS — F8089 Other developmental disorders of speech and language: Secondary | ICD-10-CM

## 2018-10-06 DIAGNOSIS — Z7982 Long term (current) use of aspirin: Secondary | ICD-10-CM

## 2018-10-06 DIAGNOSIS — M86462 Chronic osteomyelitis with draining sinus, left tibia and fibula: Secondary | ICD-10-CM

## 2018-10-06 DIAGNOSIS — Z955 Presence of coronary angioplasty implant and graft: Secondary | ICD-10-CM

## 2018-10-06 DIAGNOSIS — L89899 Pressure ulcer of other site, unspecified stage: Secondary | ICD-10-CM

## 2018-10-06 DIAGNOSIS — K219 Gastro-esophageal reflux disease without esophagitis: Secondary | ICD-10-CM

## 2018-10-06 DIAGNOSIS — J8410 Pulmonary fibrosis, unspecified: Secondary | ICD-10-CM

## 2018-10-06 DIAGNOSIS — M069 Rheumatoid arthritis, unspecified: Secondary | ICD-10-CM

## 2018-10-06 DIAGNOSIS — I10 Essential (primary) hypertension: Secondary | ICD-10-CM

## 2018-10-06 DIAGNOSIS — Z960 Presence of urogenital implants: Secondary | ICD-10-CM

## 2018-10-06 DIAGNOSIS — M869 Osteomyelitis, unspecified: Secondary | ICD-10-CM

## 2018-10-06 DIAGNOSIS — Z87311 Personal history of (healed) other pathological fracture: Secondary | ICD-10-CM

## 2018-10-06 DIAGNOSIS — L98429 Non-pressure chronic ulcer of back with unspecified severity: Secondary | ICD-10-CM | POA: Diagnosis present

## 2018-10-06 DIAGNOSIS — T8789 Other complications of amputation stump: Secondary | ICD-10-CM

## 2018-10-06 DIAGNOSIS — Z872 Personal history of diseases of the skin and subcutaneous tissue: Secondary | ICD-10-CM

## 2018-10-06 DIAGNOSIS — I252 Old myocardial infarction: Secondary | ICD-10-CM

## 2018-10-06 DIAGNOSIS — Z66 Do not resuscitate: Secondary | ICD-10-CM

## 2018-10-06 DIAGNOSIS — L03116 Cellulitis of left lower limb: Secondary | ICD-10-CM | POA: Diagnosis present

## 2018-10-06 DIAGNOSIS — Z91041 Radiographic dye allergy status: Secondary | ICD-10-CM

## 2018-10-06 DIAGNOSIS — G9340 Encephalopathy, unspecified: Secondary | ICD-10-CM

## 2018-10-06 DIAGNOSIS — Z79899 Other long term (current) drug therapy: Secondary | ICD-10-CM

## 2018-10-06 DIAGNOSIS — Z89512 Acquired absence of left leg below knee: Secondary | ICD-10-CM

## 2018-10-06 DIAGNOSIS — G40409 Other generalized epilepsy and epileptic syndromes, not intractable, without status epilepticus: Secondary | ICD-10-CM

## 2018-10-06 DIAGNOSIS — I2510 Atherosclerotic heart disease of native coronary artery without angina pectoris: Secondary | ICD-10-CM

## 2018-10-06 DIAGNOSIS — Z91048 Other nonmedicinal substance allergy status: Secondary | ICD-10-CM

## 2018-10-06 DIAGNOSIS — J449 Chronic obstructive pulmonary disease, unspecified: Secondary | ICD-10-CM

## 2018-10-06 DIAGNOSIS — F0390 Unspecified dementia without behavioral disturbance: Secondary | ICD-10-CM

## 2018-10-06 DIAGNOSIS — J9611 Chronic respiratory failure with hypoxia: Secondary | ICD-10-CM

## 2018-10-06 LAB — CBC WITH DIFFERENTIAL/PLATELET
Abs Immature Granulocytes: 0.03 10*3/uL (ref 0.00–0.07)
Basophils Absolute: 0 10*3/uL (ref 0.0–0.1)
Basophils Relative: 1 %
Eosinophils Absolute: 0.1 10*3/uL (ref 0.0–0.5)
Eosinophils Relative: 4 %
HCT: 31 % — ABNORMAL LOW (ref 36.0–46.0)
Hemoglobin: 9.7 g/dL — ABNORMAL LOW (ref 12.0–15.0)
Immature Granulocytes: 1 %
Lymphocytes Relative: 11 %
Lymphs Abs: 0.4 10*3/uL — ABNORMAL LOW (ref 0.7–4.0)
MCH: 28.6 pg (ref 26.0–34.0)
MCHC: 31.3 g/dL (ref 30.0–36.0)
MCV: 91.4 fL (ref 80.0–100.0)
Monocytes Absolute: 0.3 10*3/uL (ref 0.1–1.0)
Monocytes Relative: 8 %
Neutro Abs: 2.9 10*3/uL (ref 1.7–7.7)
Neutrophils Relative %: 75 %
Platelets: 197 10*3/uL (ref 150–400)
RBC: 3.39 MIL/uL — ABNORMAL LOW (ref 3.87–5.11)
RDW: 16 % — ABNORMAL HIGH (ref 11.5–15.5)
WBC: 3.8 10*3/uL — ABNORMAL LOW (ref 4.0–10.5)
nRBC: 0 % (ref 0.0–0.2)

## 2018-10-06 LAB — BASIC METABOLIC PANEL
Anion gap: 8 (ref 5–15)
BUN: 13 mg/dL (ref 8–23)
CO2: 28 mmol/L (ref 22–32)
Calcium: 8.2 mg/dL — ABNORMAL LOW (ref 8.9–10.3)
Chloride: 101 mmol/L (ref 98–111)
Creatinine, Ser: 0.69 mg/dL (ref 0.44–1.00)
GFR calc Af Amer: >60 mL/min (ref >60)
GFR calc non Af Amer: >60 mL/min (ref >60)
Glucose, Bld: 70 mg/dL (ref 70–99)
Potassium: 3.7 mmol/L (ref 3.5–5.1)
Sodium: 137 mmol/L (ref 135–145)

## 2018-10-06 LAB — URINE CULTURE: Culture: <10000 — AB

## 2018-10-06 LAB — SEDIMENTATION RATE: Sed Rate: 92 mm/hr — ABNORMAL HIGH (ref 0–22)

## 2018-10-06 LAB — VITAMIN B12: Vitamin B-12: 288 pg/mL (ref 180–914)

## 2018-10-06 LAB — TSH: TSH: 2.31 u[IU]/mL (ref 0.350–4.500)

## 2018-10-06 MED ORDER — PRO-STAT SUGAR FREE PO LIQD
30.0000 mL | Freq: Two times a day (BID) | ORAL | Status: DC
  Administered 2018-10-06 – 2018-10-12 (×7): 30 mL via ORAL
  Filled 2018-10-06 (×12): qty 30

## 2018-10-06 MED ORDER — ONDANSETRON HCL 4 MG PO TABS: 4.0000 mg | ORAL_TABLET | Freq: Three times a day (TID) | ORAL | Status: DC | PRN

## 2018-10-06 MED ORDER — VANCOMYCIN HCL IN DEXTROSE 1-5 GM/200ML-% IV SOLN
1000.0000 mg | INTRAVENOUS | Status: AC
  Administered 2018-10-06 – 2018-10-09 (×4): 1000 mg via INTRAVENOUS
  Filled 2018-10-06 (×7): qty 200

## 2018-10-06 MED ORDER — ENSURE ENLIVE PO LIQD
237.0000 mL | Freq: Every day | ORAL | Status: DC
  Administered 2018-10-06 – 2018-10-10 (×2): 237 mL via ORAL

## 2018-10-06 MED ORDER — ADULT MULTIVITAMIN W/MINERALS CH
1.0000 | ORAL_TABLET | Freq: Every day | ORAL | Status: DC
  Administered 2018-10-06 – 2018-10-14 (×5): 1 via ORAL
  Filled 2018-10-06 (×7): qty 1

## 2018-10-06 MED ORDER — SODIUM CHLORIDE 0.9 % IV SOLN
2.0000 g | Freq: Two times a day (BID) | INTRAVENOUS | Status: DC
  Administered 2018-10-06 – 2018-10-10 (×9): 2 g via INTRAVENOUS
  Filled 2018-10-06 (×9): qty 2

## 2018-10-06 NOTE — Consult Note (Signed)
WOC Nurse wound consult note Patient receiving care in [facility] [id number].  I spoke with her primary RN, Maralyn Sago, via telephone.  The consult was completed remotely.  Photos of the wounds were reviewed as well as the MD notes. Reason for Consult: LLE stump wound and sacral wound. Wound type: LLE appears to be infectious--possible osteomyelitis.  Sacral wound appears at least a stage 3 PI.  Sarah, RN, has agreed to measure these wounds and place the measurements in the patient record today. Pressure Injury POA: Yes Measurement: to be provided by primary RN today Wound bed: necrotic, foul smelling on LLE.  Pink on sacral Drainage (amount, consistency, odor) heavy purulent for LLE.  Minimal on sacral. Periwound: intact Dressing procedure/placement/frequency: To assist with limiting microbial load:  Cleanse the left BKA wound and the sacral wounds with saline. Insert a size appropriate piece of Aquacel Ag [place] [id number]) into each wound. Cover with dry gauze and ABD pad. Tape in place. Change daily. My primary recommendation for the LLE wound is to get orthopedic surgical services involved.  She may need additional surgery. Monitor the wound area(s) for worsening of condition such as: Signs/symptoms of infection,  Increase in size,  Development of or worsening of odor, Development of pain, or increased pain at the affected locations.  Notify the medical team if any of these develop.  Thank you for the consult.  Discussed plan of care with the patient and bedside nurse.  WOC nurse will not follow at this time.  Please re-consult the WOC team if needed.  Helmut Muster, RN, MSN, CWOCN, CNS-BC, pager [phone]
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
## 2018-10-06 NOTE — Progress Notes (Signed)
EEG Complete  Results Pending 

## 2018-10-06 NOTE — H&P (View-Only) (Signed)
  ORTHOPAEDIC CONSULTATION  REQUESTING PHYSICIAN: Mullen, Emily B, MD  Chief Complaint: Painful infection left below the knee amputation.  HPI: Whitney Steele is a 72 y.o. female who presents with worsening pain and infection left below the knee amputation with ulceration.  Patient was recently admitted for stroke symptoms and was brought back to the hospital due to worsening pain and confusion.  Past Medical History:  Diagnosis Date  . Abnormal AST and ALT   . Allergic rhinitis   . Anemia   . Benign essential hypertension   . COPD (chronic obstructive pulmonary disease) (HCC)   . Depression with anxiety   . Dysphagia   . GERD (gastroesophageal reflux disease)   . Hypercholesterolemia   . Hypertension   . Hypothyroidism   . Insomnia   . Osteoporosis   . Rheumatoid arthritis (HCC)    Past Surgical History:  Procedure Laterality Date  . ABDOMINAL HYSTERECTOMY    . 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 CARPAL TUNNEL RELEASE    . ESOPHAGOGASTRODUODENOSCOPY  09/12/2016   Distal esophageal Schatzkis ring. Moderate hh. Highly tourturous esophagus. Mild esophageal diverticulum. Superficial gastric ulcer. Esophagus Bx: benign squamoglandular mucosa with chronic inflammation and squamous hyperplasia suggestive of gastroesophageal reflux disease. Stomach Bx: Ulceration and reactive gastropthy.  . INCISIONAL HERNIA REPAIR    . LAPAROSCOPIC CHOLECYSTECTOMY    . OVARY SURGERY     removal of ovarian masses   Social History   Socioeconomic History  . Marital status: Divorced    Spouse name: Not on file  . Number of children: Not on file  . Years of education: Not on file  . Highest education level: Not on file  Occupational History  . Not on file  Social Needs  . Financial resource strain: Not on file  . Food insecurity:    Worry: Not on file    Inability: Not on file  . Transportation needs:    Medical: Not on file    Non-medical: Not on file  Tobacco  Use  . Smoking status: Never Smoker  . Smokeless tobacco: Never Used  Substance and Sexual Activity  . Alcohol use: Not Currently  . Drug use: Never  . Sexual activity: Not on file  Lifestyle  . Physical activity:    Days per week: Not on file    Minutes per session: Not on file  . Stress: Not on file  Relationships  . Social connections:    Talks on phone: Not on file    Gets together: Not on file    Attends religious service: Not on file    Active member of club or organization: Not on file    Attends meetings of clubs or organizations: Not on file    Relationship status: Not on file  Other Topics Concern  . Not on file  Social History Narrative  . Not on file   History reviewed. No pertinent family history. - negative except otherwise stated in the family history section Allergies  Allergen Reactions  . Iodinated Diagnostic Agents Hives  . Tape Other (See Comments)    Tears skin - please use paper tape   Prior to Admission medications   Medication Sig Start Date End Date Taking? Authorizing Provider  Abatacept (ORENCIA) 125 MG/ML SOSY Inject 125 mg into the skin every Thursday.   Yes [provider]  albuterol (PROAIR HFA) 108 (90 Base) MCG/ACT inhaler Inhale 2 puffs into the lungs every 6 (  six) hours as needed for wheezing or shortness of breath.  10/31/14  Yes [provider]  ALPRAZolam (XANAX) 0.5 MG tablet Take 0.5 mg by mouth 2 (two) times daily.  10/09/17  Yes [provider]  aspirin EC 81 MG tablet Take 81 mg by mouth daily.    Yes [provider]  atorvastatin (LIPITOR) 20 MG tablet Take 20 mg by mouth daily.   Yes [provider]  buPROPion (WELLBUTRIN XL) 300 MG 24 hr tablet Take 300 mg by mouth daily.   Yes [provider]  carboxymethylcellulose (REFRESH PLUS) 0.5 % SOLN Place 1 drop into both eyes 3 (three) times daily as needed (for dry eyes.).   Yes [provider]  collagenase (SANTYL) ointment  Apply 1 application topically daily as needed (wound care).   Yes [provider]  diltiazem (CARDIZEM) 30 MG tablet Take 30 mg by mouth every 8 (eight) hours.   Yes [provider]  DULoxetine (CYMBALTA) 60 MG capsule Take 60 mg by mouth daily.   Yes [provider]  gabapentin (NEURONTIN) 100 MG capsule Take 100 mg by mouth 2 (two) times daily.   Yes [provider]  ipratropium-albuterol (DUONEB) 0.5-2.5 (3) MG/3ML SOLN Inhale 3 mLs into the lungs 3 (three) times daily as needed (shortness of breath/wheezing).    Yes [provider]  isosorbide mononitrate (IMDUR) 30 MG 24 hr tablet Take 30 mg by mouth daily.   Yes [provider]  oxyCODONE ER (XTAMPZA ER) 13.5 MG C12A Take 13.5 mg by mouth every 12 (twelve) hours.   Yes [provider]  oxyCODONE-acetaminophen (PERCOCET) 10-325 MG tablet Take 1 tablet by mouth every 8 (eight) hours as needed for pain.  12/26/14  Yes [provider]  promethazine (PHENERGAN) 25 MG tablet Take 25 mg by mouth every 6 (six) hours as needed for nausea.  03/28/16  Yes [provider]  Psyllium (VEGETABLE LAXATIVE PO) Take 1 tablet by mouth daily.   Yes [provider]  ranolazine (RANEXA) 500 MG 12 hr tablet Take 500 mg by mouth 2 (two) times daily.   Yes [provider]  amoxicillin-clavulanate (AUGMENTIN) 875-125 MG tablet Take 1 tablet by mouth 2 (two) times daily.    [provider]  levETIRAcetam (KEPPRA) 500 MG tablet Take 500 mg by mouth 2 (two) times daily.    [provider]  omeprazole (PRILOSEC) 40 MG capsule Take 1 capsule (40 mg total) by mouth daily. Patient not taking: Reported on 10/05/2018 11/13/17   Lynann BolognaGupta, Rajesh, MD  polyethylene glycol powder (GLYCOLAX/MIRALAX) powder Take 17 g by mouth daily. Patient not taking: Reported on 10/05/2018 11/27/17   Lynann BolognaGupta, Rajesh, MD   Ct Head Wo Contrast  Result Date: 10/05/2018 CLINICAL DATA:   73 year old female with seizure EXAM: CT HEAD WITHOUT CONTRAST TECHNIQUE: Contiguous axial images were obtained from the base of the skull through the vertex without intravenous contrast. COMPARISON:  10/04/2018, 08/07/2018 FINDINGS: Brain: No acute intracranial hemorrhage. No midline shift or mass effect. Gray-white differentiation maintained. Confluent hypodensity in the periventricular white matter, with focal hypodensity in the left parietal region. Redemonstration of encephalomalacia of left occipital region. Unremarkable appearance of the ventricular system. Vascular: Intracranial atherosclerosis Skull: No acute fracture.  No aggressive bone lesion identified. Sinuses/Orbits: Near complete opacification of the right sphenoid sinus with internal hyperdensity and air-fluid level. Remainder of paranasal sinuses are clear. No mastoid effusion or middle ear effusion. Unremarkable orbits. Other: None IMPRESSION: Negative for acute  intracranial abnormality. Encephalomalacia in the left PCA territory with evidence of chronic microvascular ischemic disease. Right sphenoid sinus disease. Electronically Signed   By: Gilmer MorJaime  Wagner D.O.   On: 10/05/2018 19:09   Dg Pelvis Portable  Result Date: 10/05/2018 CLINICAL DATA:  73 year old female with possible sepsis EXAM: PORTABLE PELVIS 1-2 VIEWS COMPARISON:  08/07/2018, CT 08/07/2018 FINDINGS: Osteopenia. Redemonstration of posttraumatic deformity of displaced left hip fracture, subcapital. Right hip projects normally over the acetabula. No pelvic fracture identified. Unremarkable appearance of the small bowel and colon. Degenerative changes of the right hip. IMPRESSION: No acute displaced fracture. Redemonstration of left hip posttraumatic deformity, with proximal displacement of known hip fracture. Electronically Signed   By: Gilmer MorJaime  Wagner D.O.   On: 10/05/2018 16:13   Dg Chest Port 1 View  Result Date: 10/05/2018 CLINICAL DATA:  73 year old female with possible  sepsis EXAM: PORTABLE CHEST 1 VIEW COMPARISON:  08/07/2018 FINDINGS: Cardiomediastinal silhouette unchanged in size and contour. Architectural distortion with emphysematous changes of the bilateral lungs, with reticular opacities at the lung bases, likely chronic. No pneumothorax or pleural effusion. No new confluent airspace disease. Degenerative changes of the shoulders.  No acute displaced fracture. IMPRESSION: Chronic changes and emphysema without evidence of superimposed acute cardiopulmonary disease Electronically Signed   By: Gilmer MorJaime  Wagner D.O.   On: 10/05/2018 16:11   Dg Knee Left Port  Result Date: 10/05/2018 CLINICAL DATA:  73 year old female with sepsis EXAM: PORTABLE LEFT KNEE - 1-2 VIEW COMPARISON:  None. FINDINGS: Limited plain film of the knee. Lateral view demonstrates decreased soft tissue density overlying the BKA surgical site with irregularity of the cortex, uncertain chronicity. Osteopenia.  No displaced fracture.  Atherosclerosis. IMPRESSION: Limited plain film of the knee demonstrates cortical irregularity of the tibial surgical amputation site, uncertain significance. If there is concern for overlying cellulitis or wound breakdown, this could represent osteomyelitis. Atherosclerosis Electronically Signed   By: Gilmer MorJaime  Wagner D.O.   On: 10/05/2018 16:15   - pertinent xrays, CT, MRI studies were reviewed and independently interpreted  Positive ROS: All other systems have been reviewed and were otherwise negative with the exception of those mentioned in the HPI and as above.  Physical Exam: General: Alert, no acute distress Psychiatric: Patient is competent for consent with normal mood and affect Lymphatic: No axillary or cervical lymphadenopathy Cardiovascular: No pedal edema Respiratory: No cyanosis, no use of accessory musculature GI: No organomegaly, abdomen is soft and non-tender    Images:  @ENCIMAGES @  Labs:  Lab Results  Component Value Date   ESRSEDRATE 92 (H)  10/06/2018   REPTSTATUS 10/06/2018 FINAL 10/05/2018   CULT (A) 10/05/2018    <10,000 COLONIES/mL INSIGNIFICANT GROWTH Performed at [facility], [address] [place], [place] [id number]     Lab Results  Component Value Date   ALBUMIN 2.3 (L) 10/05/2018    Neurologic: Patient does not have protective sensation bilateral lower extremities.   MUSCULOSKELETAL:   Skin: Examination patient has a chronic ulcer with a flexion contracture of the left knee.  There is exposed bone.  Any attempted manipulation of the leg is painful.  Review of the radiographs shows destructive bony changes of the residual tibia consistent with chronic osteomyelitis.  Patient has a albumin of 2.3 with severe protein caloric malnutrition.  Assessment: Assessment: Multiple medical problems with osteomyelitis cellulitis and ulceration left transtibial amputation with fixed flexion contracture of the knee and severe protein caloric malnutrition  Plan: Plan: Discussed with the patient recommended treatment to  proceed with a left above-the-knee amputation.  This should relieve a lot of stress to her system from the osteomyelitis and abscess.  Would recommend proceeding with surgery on Friday once patient has had some time to stabilize while in the hospital.  Thank you for the consult and the opportunity to see Ms. Dola Argyle, MD [facility] [phone] 3:20 PM

## 2018-10-06 NOTE — Procedures (Signed)
  [facility] Jwan Hornbaker A. Gerilyn Pilgrim, MD     [email or web address]           HISTORY: This is 73 year old female who presents with acute onset loss of consciousness and convulsions.  MEDICATIONS:  Current Facility-Administered Medications:  .  acetaminophen (TYLENOL) tablet 650 mg, 650 mg, Oral, Q6H PRN **OR** acetaminophen (TYLENOL) suppository 650 mg, 650 mg, Rectal, Q6H PRN, Eulah Pont, MD .  aspirin EC tablet 81 mg, 81 mg, Oral, Daily, Eulah Pont, MD, 81 mg at 10/06/18 1011 .  ceFEPIme (MAXIPIME) 2 g in sodium chloride 0.9 % 100 mL IVPB, 2 g, Intravenous, Q12H, Pierce, Dwayne A, RPH, Last Rate: 200 mL/hr at 10/06/18 1034, 2 g at 10/06/18 1034 .  DULoxetine (CYMBALTA) DR capsule 60 mg, 60 mg, Oral, Daily, Eulah Pont, MD, 60 mg at 10/06/18 1012 .  enoxaparin (LOVENOX) injection 40 mg, 40 mg, Subcutaneous, Daily, Eulah Pont, MD, 40 mg at 10/06/18 1016 .  feeding supplement (ENSURE ENLIVE) (ENSURE ENLIVE) liquid 237 mL, 237 mL, Oral, QHS, Debe Coder B, MD .  feeding supplement (PRO-STAT SUGAR FREE 64) liquid 30 mL, 30 mL, Oral, BID, Debe Coder B, MD, 30 mL at 10/06/18 1417 .  levETIRAcetam (KEPPRA) tablet 500 mg, 500 mg, Oral, BID, Eulah Pont, MD, 500 mg at 10/06/18 1011 .  multivitamin with minerals tablet 1 tablet, 1 tablet, Oral, Daily, Inez Catalina, MD, 1 tablet at 10/06/18 1419 .  ondansetron (ZOFRAN) tablet 4 mg, 4 mg, Oral, Q8H PRN, Seawell, Jaimie A, DO .  ranolazine (RANEXA) 12 hr tablet 500 mg, 500 mg, Oral, BID, Eulah Pont, MD, 500 mg at 10/06/18 1012 .  rosuvastatin (CRESTOR) tablet 10 mg, 10 mg, Oral, q1800, Eulah Pont, MD, 10 mg at 10/06/18 1751 .  vancomycin (VANCOCIN) IVPB 1000 mg/200 mL premix, 1,000 mg, Intravenous, Q24H, Pierce, Dwayne A, RPH, Last Rate: 200 mL/hr at 10/06/18 1756, 1,000 mg at 10/06/18 1756     ANALYSIS: A 16 channel recording using standard 10 20 measurements is conducted for 35 minutes.  This appears to be a splice of 2 studies for  total of 35 minutes.  The background activity gets as high as 6 Hz.  The recording is replete with delta slowing at times however with a range of 3-4.  Sometimes the associated with the rhythmic generalized activity with frontocentral maximum.  There is beta activity observed in the frontal areas.  Photic stimulation is carried out without abnormal changes noted in the background activity.  Focal slowing is not observed.  There are several episodes of triphasic waves seen throughout the recording.  No epileptiform activities are observed.    IMPRESSION: 1.  Moderate global slowing is is noted indicating a moderate global encephalopathy. 2.  Episodic triphasic waves are observed.  These are typically seen in toxic metabolic processes especially from renal and hepatic failure.      Kahley Leib A. Gerilyn Pilgrim, M.D.  Diplomate, Biomedical engineer of Psychiatry and Neurology ( Neurology).
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
## 2018-10-06 NOTE — Progress Notes (Signed)
Received to room 5W22 from ER via stretcher. Assisted to bed and positioned for comfort. Oriented to room, bed and unit but patient is oriented to self only.

## 2018-10-06 NOTE — Consult Note (Signed)
ORTHOPAEDIC CONSULTATION  REQUESTING PHYSICIAN: Inez Catalina, MD  Chief Complaint: Painful infection left below the knee amputation.  HPI: Whitney Steele is a 73 y.o. female who presents with worsening pain and infection left below the knee amputation with ulceration.  Patient was recently admitted for stroke symptoms and was brought back to the hospital due to worsening pain and confusion.  Past Medical History:  Diagnosis Date  . Abnormal AST and ALT   . Allergic rhinitis   . Anemia   . Benign essential hypertension   . COPD (chronic obstructive pulmonary disease) (HCC)   . Depression with anxiety   . Dysphagia   . GERD (gastroesophageal reflux disease)   . Hypercholesterolemia   . Hypertension   . Hypothyroidism   . Insomnia   . Osteoporosis   . Rheumatoid arthritis [facility])    Past Surgical History:  Procedure Laterality Date  . ABDOMINAL HYSTERECTOMY    . 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 CARPAL TUNNEL RELEASE    . ESOPHAGOGASTRODUODENOSCOPY  09/12/2016   Distal esophageal Schatzkis ring. Moderate hh. Highly tourturous esophagus. Mild esophageal diverticulum. Superficial gastric ulcer. Esophagus Bx: benign squamoglandular mucosa with chronic inflammation and squamous hyperplasia suggestive of gastroesophageal reflux disease. Stomach Bx: Ulceration and reactive gastropthy.  . INCISIONAL HERNIA REPAIR    . LAPAROSCOPIC CHOLECYSTECTOMY    . OVARY SURGERY     removal of ovarian masses   Social History   Socioeconomic History  . Marital status: Divorced    Spouse name: Not on file  . Number of children: Not on file  . Years of education: Not on file  . Highest education level: Not on file  Occupational History  . Not on file  Social Needs  . Financial resource strain: Not on file  . Food insecurity:    Worry: Not on file    Inability: Not on file  . Transportation needs:    Medical: Not on file    Non-medical: Not on file  Tobacco  Use  . Smoking status: Never Smoker  . Smokeless tobacco: Never Used  Substance and Sexual Activity  . Alcohol use: Not Currently  . Drug use: Never  . Sexual activity: Not on file  Lifestyle  . Physical activity:    Days per week: Not on file    Minutes per session: Not on file  . Stress: Not on file  Relationships  . Social connections:    Talks on phone: Not on file    Gets together: Not on file    Attends religious service: Not on file    Active member of club or organization: Not on file    Attends meetings of clubs or organizations: Not on file    Relationship status: Not on file  Other Topics Concern  . Not on file  Social History Narrative  . Not on file   History reviewed. No pertinent family history. - negative except otherwise stated in the family history section Allergies  Allergen Reactions  . Iodinated Diagnostic Agents Hives  . Tape Other (See Comments)    Tears skin - please use paper tape   Prior to Admission medications   Medication Sig Start Date End Date Taking? Authorizing Provider  Abatacept (ORENCIA) 125 MG/ML SOSY Inject 125 mg into the skin every Thursday.   Yes [provider]  albuterol (PROAIR HFA) 108 (90 Base) MCG/ACT inhaler Inhale 2 puffs into the lungs every 6 (  six) hours as needed for wheezing or shortness of breath.  10/31/14  Yes [provider]  ALPRAZolam (XANAX) 0.5 MG tablet Take 0.5 mg by mouth 2 (two) times daily.  10/09/17  Yes [provider]  aspirin EC 81 MG tablet Take 81 mg by mouth daily.    Yes [provider]  atorvastatin (LIPITOR) 20 MG tablet Take 20 mg by mouth daily.   Yes [provider]  buPROPion (WELLBUTRIN XL) 300 MG 24 hr tablet Take 300 mg by mouth daily.   Yes [provider]  carboxymethylcellulose (REFRESH PLUS) 0.5 % SOLN Place 1 drop into both eyes 3 (three) times daily as needed (for dry eyes.).   Yes [provider]  collagenase (SANTYL) ointment  Apply 1 application topically daily as needed (wound care).   Yes [provider]  diltiazem (CARDIZEM) 30 MG tablet Take 30 mg by mouth every 8 (eight) hours.   Yes [provider]  DULoxetine (CYMBALTA) 60 MG capsule Take 60 mg by mouth daily.   Yes [provider]  gabapentin (NEURONTIN) 100 MG capsule Take 100 mg by mouth 2 (two) times daily.   Yes [provider]  ipratropium-albuterol (DUONEB) 0.5-2.5 (3) MG/3ML SOLN Inhale 3 mLs into the lungs 3 (three) times daily as needed (shortness of breath/wheezing).    Yes [provider]  isosorbide mononitrate (IMDUR) 30 MG 24 hr tablet Take 30 mg by mouth daily.   Yes [provider]  oxyCODONE ER (XTAMPZA ER) 13.5 MG C12A Take 13.5 mg by mouth every 12 (twelve) hours.   Yes [provider]  oxyCODONE-acetaminophen (PERCOCET) 10-325 MG tablet Take 1 tablet by mouth every 8 (eight) hours as needed for pain.  12/26/14  Yes [provider]  promethazine (PHENERGAN) 25 MG tablet Take 25 mg by mouth every 6 (six) hours as needed for nausea.  03/28/16  Yes [provider]  Psyllium (VEGETABLE LAXATIVE PO) Take 1 tablet by mouth daily.   Yes [provider]  ranolazine (RANEXA) 500 MG 12 hr tablet Take 500 mg by mouth 2 (two) times daily.   Yes [provider]  amoxicillin-clavulanate (AUGMENTIN) 875-125 MG tablet Take 1 tablet by mouth 2 (two) times daily.    [provider]  levETIRAcetam (KEPPRA) 500 MG tablet Take 500 mg by mouth 2 (two) times daily.    [provider]  omeprazole (PRILOSEC) 40 MG capsule Take 1 capsule (40 mg total) by mouth daily. Patient not taking: Reported on 10/05/2018 11/13/17   Lynann BolognaGupta, Rajesh, MD  polyethylene glycol powder (GLYCOLAX/MIRALAX) powder Take 17 g by mouth daily. Patient not taking: Reported on 10/05/2018 11/27/17   Lynann BolognaGupta, Rajesh, MD   Ct Head Wo Contrast  Result Date: 10/05/2018 CLINICAL DATA:   73 year old female with seizure EXAM: CT HEAD WITHOUT CONTRAST TECHNIQUE: Contiguous axial images were obtained from the base of the skull through the vertex without intravenous contrast. COMPARISON:  10/04/2018, 08/07/2018 FINDINGS: Brain: No acute intracranial hemorrhage. No midline shift or mass effect. Gray-white differentiation maintained. Confluent hypodensity in the periventricular white matter, with focal hypodensity in the left parietal region. Redemonstration of encephalomalacia of left occipital region. Unremarkable appearance of the ventricular system. Vascular: Intracranial atherosclerosis Skull: No acute fracture.  No aggressive bone lesion identified. Sinuses/Orbits: Near complete opacification of the right sphenoid sinus with internal hyperdensity and air-fluid level. Remainder of paranasal sinuses are clear. No mastoid effusion or middle ear effusion. Unremarkable orbits. Other: None IMPRESSION: Negative for acute  intracranial abnormality. Encephalomalacia in the left PCA territory with evidence of chronic microvascular ischemic disease. Right sphenoid sinus disease. Electronically Signed   By: Gilmer MorJaime  Wagner D.O.   On: 10/05/2018 19:09   Dg Pelvis Portable  Result Date: 10/05/2018 CLINICAL DATA:  73 year old female with possible sepsis EXAM: PORTABLE PELVIS 1-2 VIEWS COMPARISON:  08/07/2018, CT 08/07/2018 FINDINGS: Osteopenia. Redemonstration of posttraumatic deformity of displaced left hip fracture, subcapital. Right hip projects normally over the acetabula. No pelvic fracture identified. Unremarkable appearance of the small bowel and colon. Degenerative changes of the right hip. IMPRESSION: No acute displaced fracture. Redemonstration of left hip posttraumatic deformity, with proximal displacement of known hip fracture. Electronically Signed   By: Gilmer MorJaime  Wagner D.O.   On: 10/05/2018 16:13   Dg Chest Port 1 View  Result Date: 10/05/2018 CLINICAL DATA:  73 year old female with possible  sepsis EXAM: PORTABLE CHEST 1 VIEW COMPARISON:  08/07/2018 FINDINGS: Cardiomediastinal silhouette unchanged in size and contour. Architectural distortion with emphysematous changes of the bilateral lungs, with reticular opacities at the lung bases, likely chronic. No pneumothorax or pleural effusion. No new confluent airspace disease. Degenerative changes of the shoulders.  No acute displaced fracture. IMPRESSION: Chronic changes and emphysema without evidence of superimposed acute cardiopulmonary disease Electronically Signed   By: Gilmer MorJaime  Wagner D.O.   On: 10/05/2018 16:11   Dg Knee Left Port  Result Date: 10/05/2018 CLINICAL DATA:  73 year old female with sepsis EXAM: PORTABLE LEFT KNEE - 1-2 VIEW COMPARISON:  None. FINDINGS: Limited plain film of the knee. Lateral view demonstrates decreased soft tissue density overlying the BKA surgical site with irregularity of the cortex, uncertain chronicity. Osteopenia.  No displaced fracture.  Atherosclerosis. IMPRESSION: Limited plain film of the knee demonstrates cortical irregularity of the tibial surgical amputation site, uncertain significance. If there is concern for overlying cellulitis or wound breakdown, this could represent osteomyelitis. Atherosclerosis Electronically Signed   By: Gilmer MorJaime  Wagner D.O.   On: 10/05/2018 16:15   - pertinent xrays, CT, MRI studies were reviewed and independently interpreted  Positive ROS: All other systems have been reviewed and were otherwise negative with the exception of those mentioned in the HPI and as above.  Physical Exam: General: Alert, no acute distress Psychiatric: Patient is competent for consent with normal mood and affect Lymphatic: No axillary or cervical lymphadenopathy Cardiovascular: No pedal edema Respiratory: No cyanosis, no use of accessory musculature GI: No organomegaly, abdomen is soft and non-tender    Images:  @ENCIMAGES @  Labs:  Lab Results  Component Value Date   ESRSEDRATE 92 (H)  10/06/2018   REPTSTATUS 10/06/2018 FINAL 10/05/2018   CULT (A) 10/05/2018    <10,000 COLONIES/mL INSIGNIFICANT GROWTH Performed at [facility], [address] [place], [place] [id number]     Lab Results  Component Value Date   ALBUMIN 2.3 (L) 10/05/2018    Neurologic: Patient does not have protective sensation bilateral lower extremities.   MUSCULOSKELETAL:   Skin: Examination patient has a chronic ulcer with a flexion contracture of the left knee.  There is exposed bone.  Any attempted manipulation of the leg is painful.  Review of the radiographs shows destructive bony changes of the residual tibia consistent with chronic osteomyelitis.  Patient has a albumin of 2.3 with severe protein caloric malnutrition.  Assessment: Assessment: Multiple medical problems with osteomyelitis cellulitis and ulceration left transtibial amputation with fixed flexion contracture of the knee and severe protein caloric malnutrition  Plan: Plan: Discussed with the patient recommended treatment to  proceed with a left above-the-knee amputation.  This should relieve a lot of stress to her system from the osteomyelitis and abscess.  Would recommend proceeding with surgery on Friday once patient has had some time to stabilize while in the hospital.  Thank you for the consult and the opportunity to see Ms. Dola Argyle, MD [facility] [phone] 3:20 PM

## 2018-10-07 ENCOUNTER — Inpatient Hospital Stay (HOSPITAL_COMMUNITY): Payer: Medicare Other

## 2018-10-07 DIAGNOSIS — E876 Hypokalemia: Secondary | ICD-10-CM

## 2018-10-07 DIAGNOSIS — K0889 Other specified disorders of teeth and supporting structures: Secondary | ICD-10-CM

## 2018-10-07 DIAGNOSIS — F0150 Vascular dementia without behavioral disturbance: Secondary | ICD-10-CM

## 2018-10-07 DIAGNOSIS — I361 Nonrheumatic tricuspid (valve) insufficiency: Secondary | ICD-10-CM

## 2018-10-07 DIAGNOSIS — I340 Nonrheumatic mitral (valve) insufficiency: Secondary | ICD-10-CM

## 2018-10-07 LAB — ECHOCARDIOGRAM LIMITED
Height: 64 in
Weight: 1781.32 [oz_av]

## 2018-10-07 LAB — HEPATIC FUNCTION PANEL
ALT: 9 U/L (ref 0–44)
AST: 13 U/L — ABNORMAL LOW (ref 15–41)
Albumin: 2.2 g/dL — ABNORMAL LOW (ref 3.5–5.0)
Alkaline Phosphatase: 84 U/L (ref 38–126)
Bilirubin, Direct: 0.1 mg/dL (ref 0.0–0.2)
Indirect Bilirubin: 0.6 mg/dL (ref 0.3–0.9)
Total Bilirubin: 0.7 mg/dL (ref 0.3–1.2)
Total Protein: 7.2 g/dL (ref 6.5–8.1)

## 2018-10-07 LAB — CBC
HCT: 34.1 % — ABNORMAL LOW (ref 36.0–46.0)
Hemoglobin: 11.2 g/dL — ABNORMAL LOW (ref 12.0–15.0)
MCH: 28.6 pg (ref 26.0–34.0)
MCHC: 32.8 g/dL (ref 30.0–36.0)
MCV: 87.2 fL (ref 80.0–100.0)
Platelets: 230 10*3/uL (ref 150–400)
RBC: 3.91 MIL/uL (ref 3.87–5.11)
RDW: 15.9 % — ABNORMAL HIGH (ref 11.5–15.5)
WBC: 6.5 10*3/uL (ref 4.0–10.5)
nRBC: 0 % (ref 0.0–0.2)

## 2018-10-07 LAB — BASIC METABOLIC PANEL WITH GFR
Anion gap: 14 (ref 5–15)
BUN: 11 mg/dL (ref 8–23)
CO2: 24 mmol/L (ref 22–32)
Calcium: 8.3 mg/dL — ABNORMAL LOW (ref 8.9–10.3)
Chloride: 97 mmol/L — ABNORMAL LOW (ref 98–111)
Creatinine, Ser: 0.68 mg/dL (ref 0.44–1.00)
GFR calc Af Amer: >60 mL/min
GFR calc non Af Amer: >60 mL/min
Glucose, Bld: 81 mg/dL (ref 70–99)
Potassium: 3.1 mmol/L — ABNORMAL LOW (ref 3.5–5.1)
Sodium: 135 mmol/L (ref 135–145)

## 2018-10-07 LAB — MAGNESIUM: Magnesium: 1.4 mg/dL — ABNORMAL LOW (ref 1.7–2.4)

## 2018-10-07 LAB — PHOSPHORUS: Phosphorus: 3 mg/dL (ref 2.5–4.6)

## 2018-10-07 MED ORDER — POTASSIUM CHLORIDE 10 MEQ/100ML IV SOLN
10.0000 meq | INTRAVENOUS | Status: AC
  Administered 2018-10-07 (×4): 10 meq via INTRAVENOUS
  Filled 2018-10-07 (×4): qty 100

## 2018-10-07 MED ORDER — THIAMINE HCL 100 MG/ML IJ SOLN
100.0000 mg | Freq: Every day | INTRAMUSCULAR | Status: DC
  Administered 2018-10-07 – 2018-10-08 (×2): 100 mg via INTRAVENOUS
  Filled 2018-10-07 (×2): qty 2

## 2018-10-07 MED ORDER — POTASSIUM CHLORIDE 10 MEQ/100ML IV SOLN
10.0000 meq | INTRAVENOUS | Status: AC
  Administered 2018-10-07 (×2): 10 meq via INTRAVENOUS
  Filled 2018-10-07 (×2): qty 100

## 2018-10-07 MED ORDER — MAGNESIUM SULFATE 4 GM/100ML IV SOLN
4.0000 g | Freq: Once | INTRAVENOUS | Status: AC
  Administered 2018-10-07: 4 g via INTRAVENOUS
  Filled 2018-10-07 (×2): qty 100

## 2018-10-07 MED ORDER — LEVETIRACETAM IN NACL 500 MG/100ML IV SOLN
500.0000 mg | Freq: Two times a day (BID) | INTRAVENOUS | Status: DC
  Administered 2018-10-07 – 2018-10-08 (×4): 500 mg via INTRAVENOUS
  Filled 2018-10-07 (×4): qty 100

## 2018-10-07 MED ORDER — POTASSIUM CHLORIDE 20 MEQ PO PACK
20.0000 meq | PACK | ORAL | Status: DC
  Filled 2018-10-07 (×3): qty 1
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
## 2018-10-07 NOTE — Progress Notes (Signed)
  Date: 10/07/2018  Patient name: Whitney Steele  Medical record number: [id number]  Date of birth: 02/04/1946   I have seen and evaluated this patient and I have discussed the plan of care with the house staff. Please see Dr. Al Decant note for complete details. I concur with her findings and plan.   Per Dr. Audrie Lia note from 4/28, planned surgery Friday, May 1.  Will make NPO 4/30 at [place].   Inez Catalina, MD 10/07/2018, 6:36 PM

## 2018-10-07 NOTE — Progress Notes (Signed)
  Echocardiogram 2D Echocardiogram has been performed.  A limited echocardiogram was performed in accordance with the Director's protocol to limit exposure between patient to technician during Covid 19.  Whitney Steele 10/07/2018, 2:56 PM
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
## 2018-10-07 NOTE — Progress Notes (Signed)
Spoke with MD, Dr. Cleaster Corin regarding patient's refusal of oral medications and need of all oral medications to be changed to IV administration.  Interestingly, the patient early this morning at bedside shift report was able to respond correctly with her name and date of birth when asked, though disoriented to time, place, and situation.  Her speech was clear to understanding, though intermittently inappropriate and garbled.  At that time she was also able to follow basic commands and demonstrate grip strength for example.  This AM during med pass however, patient very confused and is unable to follow basic commands.  When asked if patient could take her medications one at a time with apple sauce, the patient shook her head yes. Though when attempted with oral Keppra first, the patient tightly clenched teeth and shook head no, swiping away with her hands.  Charge RN also updated.

## 2018-10-07 NOTE — Progress Notes (Signed)
Patient foley catheter leaking.   Only 8 mL found in the balloon for the foley.  Inserted 10 mL of normal saline. Foley intact.  Will continue to monitor the patient and notify as needed

## 2018-10-08 ENCOUNTER — Ambulatory Visit (INDEPENDENT_AMBULATORY_CARE_PROVIDER_SITE_OTHER): Payer: Self-pay | Admitting: Physician Assistant

## 2018-10-08 LAB — CBC
HCT: 35.2 % — ABNORMAL LOW (ref 36.0–46.0)
Hemoglobin: 11.3 g/dL — ABNORMAL LOW (ref 12.0–15.0)
MCH: 28 pg (ref 26.0–34.0)
MCHC: 32.1 g/dL (ref 30.0–36.0)
MCV: 87.3 fL (ref 80.0–100.0)
Platelets: 249 10*3/uL (ref 150–400)
RBC: 4.03 MIL/uL (ref 3.87–5.11)
RDW: 16 % — ABNORMAL HIGH (ref 11.5–15.5)
WBC: 6.1 10*3/uL (ref 4.0–10.5)
nRBC: 0 % (ref 0.0–0.2)

## 2018-10-08 LAB — BASIC METABOLIC PANEL
Anion gap: 10 (ref 5–15)
BUN: 16 mg/dL (ref 8–23)
CO2: 25 mmol/L (ref 22–32)
Calcium: 8 mg/dL — ABNORMAL LOW (ref 8.9–10.3)
Chloride: 99 mmol/L (ref 98–111)
Creatinine, Ser: 0.8 mg/dL (ref 0.44–1.00)
GFR calc Af Amer: >60 mL/min (ref >60)
GFR calc non Af Amer: >60 mL/min (ref >60)
Glucose, Bld: 83 mg/dL (ref 70–99)
Potassium: 4 mmol/L (ref 3.5–5.1)
Sodium: 134 mmol/L — ABNORMAL LOW (ref 135–145)

## 2018-10-08 LAB — MAGNESIUM: Magnesium: 2.5 mg/dL — ABNORMAL HIGH (ref 1.7–2.4)

## 2018-10-08 MED ORDER — SODIUM CHLORIDE 0.9 % IV SOLN
INTRAVENOUS | Status: DC
  Administered 2018-10-08: 12:00:00 via INTRAVENOUS

## 2018-10-08 MED ORDER — CEFAZOLIN SODIUM-DEXTROSE 2-4 GM/100ML-% IV SOLN
2.0000 g | INTRAVENOUS | Status: DC
  Filled 2018-10-08: qty 100

## 2018-10-08 MED ORDER — CHLORHEXIDINE GLUCONATE 4 % EX LIQD
60.0000 mL | Freq: Once | CUTANEOUS | Status: AC
  Administered 2018-10-09: 4 via TOPICAL
  Filled 2018-10-08: qty 60

## 2018-10-08 MED ORDER — KETOROLAC TROMETHAMINE 30 MG/ML IJ SOLN: 15.0000 mg | Freq: Four times a day (QID) | INTRAMUSCULAR | Status: DC | PRN

## 2018-10-08 MED ORDER — KETOROLAC TROMETHAMINE 30 MG/ML IJ SOLN
15.0000 mg | Freq: Three times a day (TID) | INTRAMUSCULAR | Status: DC | PRN
  Administered 2018-10-08 – 2018-10-09 (×3): 15 mg via INTRAVENOUS
  Filled 2018-10-08 (×3): qty 1

## 2018-10-08 NOTE — Progress Notes (Signed)
Spoke with MD about pt not eating - we attempted to give meds, to give sips of liquid, pt not cooperative. Pt unable to open mouth, when she did, liquid offered flowed back out. MD made aware we were not able to give any oral meds. Pt stated to MD she was not in pain, but was also moaning in pain when we rolled her for linen change. Will continue to monitor.

## 2018-10-08 NOTE — Evaluation (Signed)
Physical Therapy Evaluation Patient Details Name: Whitney Steele MRN: [id number] DOB: Oct 03, 1945 Today's Date: 10/08/2018   History of Present Illness  Pt is a 73 y/o female admitted secondary to Encephalopathy from Delirium and Vascular Dementia. Pt with increased drainage at L BKA and per notes, planning to have AKA at some point during admission.  PMH includes LLE BKA, dementia, COPD, and CAD.   Clinical Impression  Pt admitted secondary to problem above with deficits below. Pt repeating "yes ma'am" throughout session and unable to answer questions or follow commands. Pt requiring total A +2 to perform bed mobility tasks and to maintain sitting balance. Unsure of pt's baseline and will need to follow up with family/caregiver. Feel pt will need increased assist with mobility tasks. Will continue to follow acutely to maximize functional mobility independence and safety.     Follow Up Recommendations SNF;Supervision/Assistance - 24 hour    Equipment Recommendations  None recommended by PT    Recommendations for Other Services       Precautions / Restrictions Precautions Precautions: Fall Restrictions Weight Bearing Restrictions: Yes LLE Weight Bearing: Non weight bearing      Mobility  Bed Mobility Overal bed mobility: Needs Assistance Bed Mobility: Supine to Sit;Sit to Supine     Supine to sit: Total assist;+2 for physical assistance Sit to supine: Total assist;+2 for physical assistance   General bed mobility comments: Total A +2 to perform bed mobility tasks. Once sitting, asked pt to open eyes, however, pt kept eyes open and repeated "yes ma'am." Pt with R lateral lean in sitting and requiring total A to maintain sitting balance.   Transfers                    Ambulation/Gait                Stairs            Wheelchair Mobility    Modified Rankin (Stroke Patients Only)       Balance Overall balance assessment: Needs  assistance Sitting-balance support: Bilateral upper extremity supported;Feet supported Sitting balance-Leahy Scale: Zero Sitting balance - Comments: Reliant on max to total A to maintain sitting balance this session. Pt with R lateral lean in sitting.                                      Pertinent Vitals/Pain Pain Assessment: Faces Faces Pain Scale: Hurts little more Pain Location: generalized Pain Descriptors / Indicators: Grimacing;Guarding Pain Intervention(s): Limited activity within patient's tolerance;Monitored during session;Repositioned    Home Living Family/patient expects to be discharged to:: Unsure                 Additional Comments: Pt only responding "yes ma'am" to all questions, so unable to obtain home information. Will need to follow up with family.     Prior Function           Comments: Unsure of PLOF, as pt unable to answer questions at this time. Will need to follow up with family.      Hand Dominance        Extremity/Trunk Assessment   Upper Extremity Assessment Upper Extremity Assessment: Defer to OT evaluation    Lower Extremity Assessment Lower Extremity Assessment: LLE deficits/detail;RLE deficits/detail RLE Deficits / Details: Pt very resistive to any Steele in supine. Was able to perform Steele LAQ with pt sitting at  EOB.  LLE Deficits / Details: L BKA at baseline. Pt extremely resistive to any Steele with LLE     Cervical / Trunk Assessment Cervical / Trunk Assessment: Kyphotic  Communication   Communication: Expressive difficulties(pt kept repeating "yes ma'am" throughout session )  Cognition Arousal/Alertness: Lethargic Behavior During Therapy: Flat affect Overall Cognitive Status: Difficult to assess                                 General Comments: Pt kept repeating "yes ma'am" to all questions, regardless of whether it was yes/no question or not. Pt did state her name, but unable to answer other  questions.       General Comments General comments (skin integrity, edema, etc.): Pt not following any functional commands throughout session.     Exercises     Assessment/Plan    PT Assessment Patient needs continued PT services  PT Problem List Decreased strength;Decreased balance;Decreased activity tolerance;Decreased mobility;Decreased range of motion;Decreased cognition;Decreased knowledge of use of DME;Decreased safety awareness;Decreased knowledge of precautions       PT Treatment Interventions DME instruction;Functional mobility training;Therapeutic activities;Therapeutic exercise;Gait training;Balance training;Patient/family education;Cognitive remediation    PT Goals (Current goals can be found in the Care Plan section)  Acute Rehab PT Goals PT Goal Formulation: Patient unable to participate in goal setting Time For Goal Achievement: 10/22/18 Potential to Achieve Goals: Fair    Frequency Min 2X/week   Barriers to discharge Other (comment) Unsure of caregiver support     Co-evaluation PT/OT/SLP Co-Evaluation/Treatment: Yes Reason for Co-Treatment: Complexity of the patient's impairments (multi-system involvement);To address functional/ADL transfers;For patient/therapist safety;Necessary to address cognition/behavior during functional activity PT goals addressed during session: Mobility/safety with mobility;Balance         AM-PAC PT "6 Clicks" Mobility  Outcome Measure Help needed turning from your back to your side while in a flat bed without using bedrails?: Total Help needed moving from lying on your back to sitting on the side of a flat bed without using bedrails?: Total Help needed moving to and from a bed to a chair (including a wheelchair)?: Total Help needed standing up from a chair using your arms (e.g., wheelchair or bedside chair)?: Total Help needed to walk in hospital room?: Total Help needed climbing 3-5 steps with a railing? : Total 6 Click Score:  6    End of Session   Activity Tolerance: Patient limited by lethargy Patient left: in bed;with call bell/phone within reach;with bed alarm set Nurse Communication: Mobility status PT Visit Diagnosis: Unsteadiness on feet (R26.81);Muscle weakness (generalized) (M62.81);Difficulty in walking, not elsewhere classified (R26.2)    Time: 6073-7106 PT Time Calculation (min) (ACUTE ONLY): 18 min   Charges:   PT Evaluation $PT Eval Moderate Complexity: 1 Mod          Gladys Damme, PT, DPT  Acute Rehabilitation Services  Pager: [phone] Office: [phone]   Whitney Steele 10/08/2018, 4:30 PM
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
## 2018-10-08 NOTE — Progress Notes (Signed)
Patient ID: Whitney Steele, female   DOB: [birth year], 73 y.o.   MRN: [id number] Sacral wound evaluated at the request of the primary service.  This wound is very small and shallow.  It has a very thin layer of some fibrin, but pink is visualized underneath this.  No evidence of infection. No need for surgical debridement.  Current therapy for his wound is adequate.  No surgical needs at this time.  Letha Cape 3:36 PM 10/08/2018

## 2018-10-08 NOTE — Evaluation (Signed)
Occupational Therapy Evaluation Patient Details Name: Whitney Steele MRN: [id number] DOB: 10/07/1945 Today's Date: 10/08/2018    History of Present Illness Pt is a 73 y/o female admitted secondary to Encephalopathy from Delirium and Vascular Dementia. Pt with increased drainage at L BKA and per notes, planning to have AKA at some point during admission.  PMH includes LLE BKA, dementia, COPD, and CAD.    Clinical Impression   Pt PTA: unable to obtain at this time. Per chart notes, pt's daughter is concerned as pt was following commands and feeding self prior to this. Pt currently obtunded and not opening eyes, following no commands, but answering to name. Pt maxA to totalA+2 for bed mobility and pt with posterior lean  And resistive behavior sitting EOB. Pt totalA for ADL at this time. LLE AKA scheduled for tomorrow. OT would benefit pt in order to  Increase independence with ADL, mobility and safety in SNF setting. OT to follow acutely.    Follow Up Recommendations  SNF;Supervision/Assistance - 24 hour    Equipment Recommendations  Other (comment)(to be determined)    Recommendations for Other Services       Precautions / Restrictions Precautions Precautions: Fall Restrictions Weight Bearing Restrictions: Yes LLE Weight Bearing: Non weight bearing      Mobility Bed Mobility Overal bed mobility: Needs Assistance Bed Mobility: Supine to Sit;Sit to Supine     Supine to sit: Total assist;+2 for physical assistance Sit to supine: Total assist;+2 for physical assistance   General bed mobility comments: Total A +2 to perform bed mobility tasks. Once sitting, asked pt to open eyes, however, pt kept eyes open and repeated "yes ma'am." Pt with R lateral lean in sitting and requiring total A to maintain sitting balance.   Transfers                 General transfer comment: not safe to test- pt too fatigued easily.    Balance Overall balance assessment: Needs  assistance Sitting-balance support: Bilateral upper extremity supported;Feet supported Sitting balance-Leahy Scale: Zero Sitting balance - Comments: Reliant on max to total A to maintain sitting balance this session. Pt with R lateral lean in sitting.                                    ADL either performed or assessed with clinical judgement   ADL Overall ADL's : Needs assistance/impaired Eating/Feeding: Total assistance Eating/Feeding Details (indicate cue type and reason): Pt resisting food today and pt not alert enough for feeding tasks. Grooming: Total assistance;Bed level   Upper Body Bathing: Total assistance;Bed level   Lower Body Bathing: Total assistance;Bed level   Upper Body Dressing : Total assistance;Bed level   Lower Body Dressing: Total assistance;Bed level   Toilet Transfer: Total assistance   Toileting- Clothing Manipulation and Hygiene: Total assistance       Functional mobility during ADLs: Total assistance(unable to tolerate sitting EOB >2 mins) General ADL Comments: TotalA. pt unable to follow commands to wash face and unable to saying anything other than her name 1x  and  "yes ma'am"     Vision Baseline Vision/History: No visual deficits Vision Assessment?: Vision impaired- to be further tested in functional context Additional Comments: Pt kept eye closed     Perception     Praxis      Pertinent Vitals/Pain Pain Assessment: Faces Faces Pain Scale: Hurts little more Pain Location:  generalized Pain Descriptors / Indicators: Grimacing;Guarding Pain Intervention(s): Monitored during session;Limited activity within patient's tolerance     Hand Dominance     Extremity/Trunk Assessment Upper Extremity Assessment Upper Extremity Assessment: Defer to OT evaluation   Lower Extremity Assessment Lower Extremity Assessment: Defer to PT evaluation;LLE deficits/detail RLE Deficits / Details: Pt very resistive to any PROM in supine. LLE  Deficits / Details: L BKA at baseline. Pt extremely resistive to any PROM with LLE    Cervical / Trunk Assessment Cervical / Trunk Assessment: Kyphotic   Communication Communication Communication: Expressive difficulties(pt kept repeating "yes ma'am" throughout session )   Cognition Arousal/Alertness: Lethargic Behavior During Therapy: Flat affect Overall Cognitive Status: Difficult to assess                                 General Comments: Pt kept repeating "yes ma'am" to all questions, regardless of whether it was yes/no question or not. Pt did state her name, but unable to answer other questions.    General Comments  Pt not following any functional commands throughout session.     Exercises     Shoulder Instructions      Home Living Family/patient expects to be discharged to:: Unsure                                 Additional Comments: Pt only responding "yes ma'am" to all questions, so unable to obtain home information. Will need to follow up with family.       Prior Functioning/Environment          Comments: Unsure of PLOF, as pt unable to answer questions at this time. Will need to follow up with family.         OT Problem List: Decreased strength;Decreased activity tolerance;Impaired balance (sitting and/or standing);Impaired vision/perception;Decreased safety awareness;Pain      OT Treatment/Interventions: Self-care/ADL training;Neuromuscular education;Energy conservation;Therapeutic activities;Patient/family education;Balance training;Cognitive remediation/compensation;DME and/or AE instruction;Therapeutic exercise    OT Goals(Current goals can be found in the care plan section) Acute Rehab OT Goals Patient Stated Goal: none stated OT Goal Formulation: Patient unable to participate in goal setting Time For Goal Achievement: 10/22/18 Potential to Achieve Goals: Good ADL Goals Pt Will Perform Grooming: with modified  independence;sitting Pt Will Perform Upper Body Dressing: with min guard assist;sitting Pt Will Transfer to Toilet: with min assist;squat pivot transfer Additional ADL Goal #1: pt will perform transfer to commode or recliner with minA overall and 100% command following.  OT Frequency: Min 3X/week   Barriers to D/C:    unknown       Co-evaluation PT/OT/SLP Co-Evaluation/Treatment: Yes Reason for Co-Treatment: Complexity of the patient's impairments (multi-system involvement);For patient/therapist safety PT goals addressed during session: Mobility/safety with mobility;Balance OT goals addressed during session: ADL's and self-care      AM-PAC OT "6 Clicks" Daily Activity     Outcome Measure Help from another person eating meals?: Total Help from another person taking care of personal grooming?: Total Help from another person toileting, which includes using toliet, bedpan, or urinal?: Total Help from another person bathing (including washing, rinsing, drying)?: Total Help from another person to put on and taking off regular upper body clothing?: Total Help from another person to put on and taking off regular lower body clothing?: Total 6 Click Score: 6   End of Session Nurse Communication: Mobility status  Activity Tolerance: Patient limited by lethargy;Patient limited by fatigue;Treatment limited secondary to medical complications (Comment) Patient left: in bed;with call bell/phone within reach;with bed alarm set  OT Visit Diagnosis: Unsteadiness on feet (R26.81);Muscle weakness (generalized) (M62.81);Pain Pain - Right/Left: Left Pain - part of body: Leg                Time: 1345-1400 OT Time Calculation (min): 15 min Charges:  OT General Charges $OT Visit: 1 Visit OT Evaluation $OT Eval Moderate Complexity: 1 Mod  Cristi Loron) Glendell Docker OTR/L Acute Rehabilitation Services Pager: [phone] Office: [phone]   Lonzo Cloud 10/08/2018, 4:44 PM

## 2018-10-08 NOTE — Progress Notes (Signed)
Pt's daughter Whitney Steele called asking for an update. She is very concerned about her mother, as she states that when her mother came to the hospital on Monday, though she was sick, she was still talking and eating, so the fact that she is not communicating and is refusing food and drink concerns Whitney Steele greatly.  Whitney Steele said that she and her stepfather, Whitney Steele, had spoken and made a decision about the surgery. They would like to speak to Dr Leanor Rubenstein again and ask another question before giving consent. I called Dr Leanor Rubenstein and asked her to give them a call.  While I was on the phone, they tried to speak to the patient, who opened her eyes a little at her daughter's voice, but did not speak. I gave family emotional support.

## 2018-10-08 NOTE — Progress Notes (Signed)
  Date: 10/08/2018  Patient name: Whitney Steele  Medical record number: [id number]  Date of birth: [birth year]   I have seen and evaluated this patient and I have discussed the plan of care with the house staff. Please see Dr. Al Decant note for complete details. I concur with her findings and plan.     Inez Catalina, MD 10/08/2018, 7:02 PM

## 2018-10-08 NOTE — Progress Notes (Signed)
I called Kimber Relic and, with charge nurse as witness, obtained phone consent for procedure tomorrow. Signed consent placed on chart.

## 2018-10-08 NOTE — Progress Notes (Signed)
Pharmacy Antibiotic Note  Whitney Steele is a 73 y.o. female admitted on 10/05/2018 with wound infection Continues on Vancomycin and Cefepime with planned AKA 5/1 Afebrile Cultures negative to date Scr stable  Plan: Cefepime 2g IV q12h Vancomycin   1000 mg IV Q 24 hrs  Follow up post surgery 5/1  Height: 5\' 4"  (162.6 cm) Weight: 111 lb 5.3 oz (50.5 kg) IBW/kg (Calculated) : 54.7  Temp (24hrs), Avg:97.9 F (36.6 C), Min:97.8 F (36.6 C), Max:98 F (36.7 C)  Recent Labs  Lab 10/05/18 1559 10/05/18 1714 10/06/18 0012 10/07/18 0249 10/08/18 0304  WBC 3.6*  --  3.8* 6.5 6.1  CREATININE 0.78  --  0.69 0.68 0.80  LATICACIDVEN 1.3 0.9  --   --   --     Estimated Creatinine Clearance: 51.4 mL/min (by C-G formula based on SCr of 0.8 mg/dL).    Allergies  Allergen Reactions  . Iodinated Diagnostic Agents Hives  . Tape Other (See Comments)    Tears skin - please use paper tape   Thank you Okey Regal, PharmD [phone] 10/08/2018 11:04 AM

## 2018-10-09 ENCOUNTER — Inpatient Hospital Stay (HOSPITAL_COMMUNITY): Payer: Medicare Other | Admitting: Certified Registered"

## 2018-10-09 ENCOUNTER — Encounter (HOSPITAL_COMMUNITY)
Admission: EM | Disposition: A | Discharge status: Skilled Nursing Facility | Payer: Self-pay | Source: Home / Self Care | Attending: Internal Medicine

## 2018-10-09 DIAGNOSIS — Z89612 Acquired absence of left leg above knee: Secondary | ICD-10-CM

## 2018-10-09 HISTORY — PX: AMPUTATION: SHX166

## 2018-10-09 LAB — CBC
HCT: 35 % — ABNORMAL LOW (ref 36.0–46.0)
Hemoglobin: 11.3 g/dL — ABNORMAL LOW (ref 12.0–15.0)
MCH: 28.8 pg (ref 26.0–34.0)
MCHC: 32.3 g/dL (ref 30.0–36.0)
MCV: 89.1 fL (ref 80.0–100.0)
Platelets: 275 10*3/uL (ref 150–400)
RBC: 3.93 MIL/uL (ref 3.87–5.11)
RDW: 16 % — ABNORMAL HIGH (ref 11.5–15.5)
WBC: 6.6 10*3/uL (ref 4.0–10.5)
nRBC: 0 % (ref 0.0–0.2)

## 2018-10-09 LAB — COMPREHENSIVE METABOLIC PANEL
ALT: 8 U/L (ref 0–44)
AST: 12 U/L — ABNORMAL LOW (ref 15–41)
Albumin: 2.3 g/dL — ABNORMAL LOW (ref 3.5–5.0)
Alkaline Phosphatase: 83 U/L (ref 38–126)
Anion gap: 14 (ref 5–15)
BUN: 13 mg/dL (ref 8–23)
CO2: 22 mmol/L (ref 22–32)
Calcium: 8.2 mg/dL — ABNORMAL LOW (ref 8.9–10.3)
Chloride: 99 mmol/L (ref 98–111)
Creatinine, Ser: 0.72 mg/dL (ref 0.44–1.00)
GFR calc Af Amer: >60 mL/min (ref >60)
GFR calc non Af Amer: >60 mL/min (ref >60)
Glucose, Bld: 86 mg/dL (ref 70–99)
Potassium: 3 mmol/L — ABNORMAL LOW (ref 3.5–5.1)
Sodium: 135 mmol/L (ref 135–145)
Total Bilirubin: 1.1 mg/dL (ref 0.3–1.2)
Total Protein: 7.6 g/dL (ref 6.5–8.1)

## 2018-10-09 LAB — METHYLMALONIC ACID, SERUM: Methylmalonic Acid, Quantitative: 158 nmol/L (ref 0–378)

## 2018-10-09 LAB — SURGICAL PCR SCREEN
MRSA, PCR: NEGATIVE
Staphylococcus aureus: NEGATIVE

## 2018-10-09 LAB — MAGNESIUM: Magnesium: 1.7 mg/dL (ref 1.7–2.4)

## 2018-10-09 SURGERY — AMPUTATION, ABOVE KNEE
Anesthesia: General | Laterality: Left

## 2018-10-09 MED ORDER — ACETAMINOPHEN 325 MG PO TABS: 325.0000 mg | ORAL_TABLET | Freq: Four times a day (QID) | ORAL | Status: DC | PRN

## 2018-10-09 MED ORDER — PROPOFOL 10 MG/ML IV BOLUS
INTRAVENOUS | Status: DC | PRN
  Administered 2018-10-09: 130 mg via INTRAVENOUS

## 2018-10-09 MED ORDER — POLYETHYLENE GLYCOL 3350 17 G PO PACK: 17.0000 g | PACK | Freq: Every day | ORAL | Status: DC | PRN

## 2018-10-09 MED ORDER — HYDROCODONE-ACETAMINOPHEN 5-325 MG PO TABS: 1.0000 | ORAL_TABLET | ORAL | Status: DC | PRN

## 2018-10-09 MED ORDER — HYDROMORPHONE HCL 1 MG/ML IJ SOLN
INTRAMUSCULAR | Status: AC
  Administered 2018-10-09: 0.5 mg via INTRAVENOUS
  Filled 2018-10-09: qty 1

## 2018-10-09 MED ORDER — SUCCINYLCHOLINE CHLORIDE 20 MG/ML IJ SOLN
INTRAMUSCULAR | Status: DC | PRN
  Administered 2018-10-09: 120 mg via INTRAVENOUS

## 2018-10-09 MED ORDER — 0.9 % SODIUM CHLORIDE (POUR BTL) OPTIME
TOPICAL | Status: DC | PRN
  Administered 2018-10-09: 07:00:00 1000 mL

## 2018-10-09 MED ORDER — DEXTROSE-NACL 5-0.45 % IV SOLN
INTRAVENOUS | Status: AC
  Administered 2018-10-09: 12:00:00 via INTRAVENOUS

## 2018-10-09 MED ORDER — HYDROMORPHONE HCL 1 MG/ML IJ SOLN
0.2500 mg | INTRAMUSCULAR | Status: DC | PRN
  Administered 2018-10-09 (×2): 0.5 mg via INTRAVENOUS

## 2018-10-09 MED ORDER — DOCUSATE SODIUM 100 MG PO CAPS
100.0000 mg | ORAL_CAPSULE | Freq: Two times a day (BID) | ORAL | Status: DC
  Filled 2018-10-09: qty 1

## 2018-10-09 MED ORDER — LACTATED RINGERS IV SOLN
INTRAVENOUS | Status: DC | PRN
  Administered 2018-10-09: 07:00:00 via INTRAVENOUS

## 2018-10-09 MED ORDER — ONDANSETRON HCL 4 MG/2ML IJ SOLN: 4.0000 mg | Freq: Four times a day (QID) | INTRAMUSCULAR | Status: DC | PRN

## 2018-10-09 MED ORDER — PHENYLEPHRINE 40 MCG/ML (10ML) SYRINGE FOR IV PUSH (FOR BLOOD PRESSURE SUPPORT)
PREFILLED_SYRINGE | INTRAVENOUS | Status: DC | PRN
  Administered 2018-10-09: 120 ug via INTRAVENOUS

## 2018-10-09 MED ORDER — DEXAMETHASONE SODIUM PHOSPHATE 10 MG/ML IJ SOLN
INTRAMUSCULAR | Status: DC | PRN
  Administered 2018-10-09: 5 mg via INTRAVENOUS

## 2018-10-09 MED ORDER — FENTANYL CITRATE (PF) 250 MCG/5ML IJ SOLN
INTRAMUSCULAR | Status: AC
  Filled 2018-10-09: qty 5

## 2018-10-09 MED ORDER — METHOCARBAMOL 500 MG PO TABS: 500.0000 mg | ORAL_TABLET | Freq: Four times a day (QID) | ORAL | Status: DC | PRN

## 2018-10-09 MED ORDER — LEVETIRACETAM 500 MG PO TABS: 500.0000 mg | ORAL_TABLET | Freq: Two times a day (BID) | ORAL | Status: DC

## 2018-10-09 MED ORDER — CEFAZOLIN SODIUM-DEXTROSE 2-3 GM-%(50ML) IV SOLR
INTRAVENOUS | Status: DC | PRN
  Administered 2018-10-09: 2 g via INTRAVENOUS

## 2018-10-09 MED ORDER — DEXAMETHASONE SODIUM PHOSPHATE 10 MG/ML IJ SOLN
INTRAMUSCULAR | Status: AC
  Filled 2018-10-09: qty 1

## 2018-10-09 MED ORDER — HYDROCODONE-ACETAMINOPHEN 7.5-325 MG PO TABS: 1.0000 | ORAL_TABLET | ORAL | Status: DC | PRN

## 2018-10-09 MED ORDER — VITAMIN B-1 100 MG PO TABS: 100.0000 mg | ORAL_TABLET | Freq: Every day | ORAL | Status: DC

## 2018-10-09 MED ORDER — MAGNESIUM CITRATE PO SOLN: 1.0000 | Freq: Once | ORAL | Status: DC | PRN

## 2018-10-09 MED ORDER — METOCLOPRAMIDE HCL 10 MG PO TABS: 5.0000 mg | ORAL_TABLET | Freq: Three times a day (TID) | ORAL | Status: DC | PRN

## 2018-10-09 MED ORDER — POTASSIUM CHLORIDE 10 MEQ/100ML IV SOLN
10.0000 meq | INTRAVENOUS | Status: DC
  Administered 2018-10-09 (×2): 10 meq via INTRAVENOUS
  Filled 2018-10-09 (×2): qty 100

## 2018-10-09 MED ORDER — ONDANSETRON HCL 4 MG/2ML IJ SOLN
INTRAMUSCULAR | Status: AC
  Filled 2018-10-09: qty 2

## 2018-10-09 MED ORDER — METOCLOPRAMIDE HCL 5 MG/ML IJ SOLN: 5.0000 mg | Freq: Three times a day (TID) | INTRAMUSCULAR | Status: DC | PRN

## 2018-10-09 MED ORDER — THIAMINE HCL 100 MG/ML IJ SOLN
100.0000 mg | Freq: Once | INTRAMUSCULAR | Status: AC
  Administered 2018-10-09: 100 mg via INTRAVENOUS
  Filled 2018-10-09: qty 2

## 2018-10-09 MED ORDER — FENTANYL CITRATE (PF) 250 MCG/5ML IJ SOLN
INTRAMUSCULAR | Status: DC | PRN
  Administered 2018-10-09 (×2): 50 ug via INTRAVENOUS

## 2018-10-09 MED ORDER — BISACODYL 10 MG RE SUPP: 10.0000 mg | Freq: Every day | RECTAL | Status: DC | PRN

## 2018-10-09 MED ORDER — LIDOCAINE 2% (20 MG/ML) 5 ML SYRINGE
INTRAMUSCULAR | Status: DC | PRN
  Administered 2018-10-09: 60 mg via INTRAVENOUS

## 2018-10-09 MED ORDER — MORPHINE SULFATE (PF) 2 MG/ML IV SOLN
0.5000 mg | INTRAVENOUS | Status: DC | PRN
  Administered 2018-10-09 – 2018-10-10 (×5): 1 mg via INTRAVENOUS
  Filled 2018-10-09 (×5): qty 1

## 2018-10-09 MED ORDER — ONDANSETRON HCL 4 MG/2ML IJ SOLN
INTRAMUSCULAR | Status: DC | PRN
  Administered 2018-10-09: 4 mg via INTRAVENOUS

## 2018-10-09 MED ORDER — LEVETIRACETAM IN NACL 500 MG/100ML IV SOLN
500.0000 mg | Freq: Once | INTRAVENOUS | Status: AC
  Administered 2018-10-09: 11:00:00 500 mg via INTRAVENOUS
  Filled 2018-10-09: qty 100

## 2018-10-09 MED ORDER — ONDANSETRON HCL 4 MG PO TABS: 4.0000 mg | ORAL_TABLET | Freq: Four times a day (QID) | ORAL | Status: DC | PRN

## 2018-10-09 MED ORDER — LEVETIRACETAM IN NACL 500 MG/100ML IV SOLN
500.0000 mg | Freq: Two times a day (BID) | INTRAVENOUS | Status: DC
  Administered 2018-10-09 – 2018-10-12 (×6): 500 mg via INTRAVENOUS
  Filled 2018-10-09 (×6): qty 100

## 2018-10-09 MED ORDER — MIDAZOLAM HCL 2 MG/2ML IJ SOLN
INTRAMUSCULAR | Status: AC
  Filled 2018-10-09: qty 2

## 2018-10-09 MED ORDER — SODIUM CHLORIDE 0.9 % IV SOLN
INTRAVENOUS | Status: DC
  Administered 2018-10-09: 10:00:00 via INTRAVENOUS

## 2018-10-09 MED ORDER — MAGNESIUM SULFATE 2 GM/50ML IV SOLN
2.0000 g | Freq: Once | INTRAVENOUS | Status: AC
  Administered 2018-10-09: 2 g via INTRAVENOUS
  Filled 2018-10-09 (×2): qty 50

## 2018-10-09 MED ORDER — LIDOCAINE 2% (20 MG/ML) 5 ML SYRINGE
INTRAMUSCULAR | Status: AC
  Filled 2018-10-09: qty 5

## 2018-10-09 MED ORDER — METHOCARBAMOL 1000 MG/10ML IJ SOLN
500.0000 mg | Freq: Four times a day (QID) | INTRAVENOUS | Status: DC | PRN
  Filled 2018-10-09: qty 5

## 2018-10-09 MED ORDER — PROPOFOL 10 MG/ML IV BOLUS
INTRAVENOUS | Status: AC
  Filled 2018-10-09: qty 20

## 2018-10-09 SURGICAL SUPPLY — 43 items
BLADE SAW RECIP 87.9 MT (BLADE) ×2 IMPLANT
BLADE SURG 21 STRL SS (BLADE) ×2 IMPLANT
BNDG COHESIVE 6X5 TAN STRL LF (GAUZE/BANDAGES/DRESSINGS) ×2 IMPLANT
CANISTER WOUND CARE 500ML ATS (WOUND CARE) IMPLANT
CANISTER WOUNDNEG PRESSURE 500 (CANNISTER) ×1 IMPLANT
COVER SURGICAL LIGHT HANDLE (MISCELLANEOUS) ×2 IMPLANT
COVER WAND RF STERILE (DRAPES) IMPLANT
CUFF TOURNIQUET SINGLE 34IN LL (TOURNIQUET CUFF) IMPLANT
DRAPE INCISE IOBAN 66X45 STRL (DRAPES) ×4 IMPLANT
DRAPE U-SHAPE 47X51 STRL (DRAPES) ×2 IMPLANT
DRESSING PREVENA PLUS CUSTOM (GAUZE/BANDAGES/DRESSINGS) ×1 IMPLANT
DRSG PREVENA PLUS CUSTOM (GAUZE/BANDAGES/DRESSINGS) ×2
DURAPREP 26ML APPLICATOR (WOUND CARE) ×2 IMPLANT
ELECT REM PT RETURN 9FT ADLT (ELECTROSURGICAL) ×2
ELECTRODE REM PT RTRN 9FT ADLT (ELECTROSURGICAL) ×1 IMPLANT
GLOVE BIOGEL PI IND STRL 7.5 (GLOVE) ×1 IMPLANT
GLOVE BIOGEL PI IND STRL 9 (GLOVE) ×1 IMPLANT
GLOVE BIOGEL PI INDICATOR 7.5 (GLOVE) ×2
GLOVE BIOGEL PI INDICATOR 9 (GLOVE) ×1
GLOVE SURG ORTHO 9.0 STRL STRW (GLOVE) ×2 IMPLANT
GLOVE SURG SS PI 6.5 STRL IVOR (GLOVE) ×2 IMPLANT
GLOVE SURG SS PI 7.5 STRL IVOR (GLOVE) ×2 IMPLANT
GOWN STRL REUS W/ TWL LRG LVL3 (GOWN DISPOSABLE) ×1 IMPLANT
GOWN STRL REUS W/ TWL XL LVL3 (GOWN DISPOSABLE) ×2 IMPLANT
GOWN STRL REUS W/TWL LRG LVL3 (GOWN DISPOSABLE) ×2
GOWN STRL REUS W/TWL XL LVL3 (GOWN DISPOSABLE) ×4
KIT BASIN OR (CUSTOM PROCEDURE TRAY) ×2 IMPLANT
KIT TURNOVER KIT B (KITS) ×2 IMPLANT
MANIFOLD NEPTUNE II (INSTRUMENTS) ×1 IMPLANT
NS IRRIG 1000ML POUR BTL (IV SOLUTION) ×2 IMPLANT
PACK ORTHO EXTREMITY (CUSTOM PROCEDURE TRAY) ×2 IMPLANT
PAD ARMBOARD 7.5X6 YLW CONV (MISCELLANEOUS) ×2 IMPLANT
PREVENA RESTOR ARTHOFORM 33X30 (CANNISTER) ×1 IMPLANT
PREVENA RESTOR ARTHOFORM 46X30 (CANNISTER) ×2 IMPLANT
SPONGE LAP 18X18 X RAY DECT (DISPOSABLE) ×1 IMPLANT
STAPLER VISISTAT 35W (STAPLE) ×1 IMPLANT
STOCKINETTE IMPERVIOUS LG (DRAPES) ×1 IMPLANT
SUT ETHILON 2 0 PSLX (SUTURE) ×4 IMPLANT
SUT SILK 2 0 (SUTURE) ×2
SUT SILK 2-0 18XBRD TIE 12 (SUTURE) ×1 IMPLANT
TOWEL GREEN STERILE FF (TOWEL DISPOSABLE) ×2 IMPLANT
TUBE CONNECTING 20X1/4 (TUBING) ×2 IMPLANT
YANKAUER SUCT BULB TIP NO VENT (SUCTIONS) ×2 IMPLANT

## 2018-10-09 NOTE — Interval H&P Note (Signed)
History and Physical Interval Note:  10/09/2018 6:46 AM  Whitney Steele  has presented today for surgery, with the diagnosis of Osteomyelitis, Ulcer Left Below Knee Amputation.  The various methods of treatment have been discussed with the patient and family. After consideration of risks, benefits and other options for treatment, the patient has consented to  Procedure(s): LEFT ABOVE KNEE AMPUTATION (Left) as a surgical intervention.  The patient's history has been reviewed, patient examined, no change in status, stable for surgery.  I have reviewed the patient's chart and labs.  Questions were answered to the patient's satisfaction.     Nadara Mustard

## 2018-10-09 NOTE — Op Note (Signed)
10/09/2018  8:34 AM  PATIENT:  Whitney Steele    PRE-OPERATIVE DIAGNOSIS:  Osteomyelitis, Ulcer Left Below Knee Amputation  POST-OPERATIVE DIAGNOSIS:  Same  PROCEDURE:  LEFT ABOVE KNEE AMPUTATION   SURGEON:  Nadara Mustard, MD  PHYSICIAN ASSISTANT:None ANESTHESIA:   General  PREOPERATIVE INDICATIONS:  Whitney Steele is a  73 y.o. female with a diagnosis of Osteomyelitis, Ulcer Left Below Knee Amputation who failed conservative measures and elected for surgical management.    The risks benefits and alternatives were discussed with the patient preoperatively including but not limited to the risks of infection, bleeding, nerve injury, cardiopulmonary complications, the need for revision surgery, among others, and the patient was willing to proceed.  OPERATIVE IMPLANTS: Praveena wound VAC both customizable and restore.  @ENCIMAGES @  OPERATIVE FINDINGS: Muscle with good color consistency and contractility.  All vessels calcified.  OPERATIVE PROCEDURE: Patient was brought the operating room and underwent a general anesthetic.  After adequate levels anesthesia were obtained patient's left lower extremity was prepped using DuraPrep draped into a sterile field a timeout was called.  A fishmouth incision was made through the mid thigh.  This was carried down to bone electrocardioversion used for hemostasis.  The medial vascular bundles were suture ligated with 2-0 silk.  A reciprocating saw was used to complete the amputation through the femur.  The wound was irrigated with normal saline hemostasis was obtained.  The deep fascia layers were closed using #1 Vicryl.  The skin was closed using nylon and staples.  A Praveena restore and customizable wound VAC was applied this had a good suction fit patient was extubated taken the PACU in stable condition.   DISCHARGE PLANNING:  Antibiotic duration: Continue IV antibiotics for 24 hours postoperatively.  Weightbearing: Bed to chair transfers  no gait training  Pain medication: Low-dose opioid pathway ordered  Dressing care/ Wound VAC: Continue wound VAC and discharge with the portable Praveena wound VAC pump  Ambulatory devices: Bed to chair transfers  Discharge to: Skilled nursing facility  Follow-up: In the office 1 week post operative.

## 2018-10-09 NOTE — Anesthesia Postprocedure Evaluation (Signed)
Anesthesia Post Note  Patient: Whitney Steele  Procedure(s) Performed: LEFT ABOVE KNEE AMPUTATION (Left )     Patient location during evaluation: PACU Anesthesia Type: General Level of consciousness: awake and alert Pain management: pain level controlled Vital Signs Assessment: post-procedure vital signs reviewed and stable Respiratory status: spontaneous breathing, nonlabored ventilation, respiratory function stable and patient connected to nasal cannula oxygen Cardiovascular status: blood pressure returned to baseline and stable Postop Assessment: no apparent nausea or vomiting Anesthetic complications: no    Last Vitals:  Vitals:   10/09/18 0855 10/09/18 0910  BP: 129/90 130/87  Pulse: (!) 105 (!) 104  Resp: 17 14  Temp:  (!) 36.2 C  SpO2: 100% 99%    Last Pain:  Vitals:   10/09/18 0525  TempSrc: Oral  PainSc:                  [name],[name]

## 2018-10-09 NOTE — Transfer of Care (Signed)
Immediate Anesthesia Transfer of Care Note  Patient: Whitney Steele  Procedure(s) Performed: LEFT ABOVE KNEE AMPUTATION (Left )  Patient Location: PACU  Anesthesia Type:General  Level of Consciousness: drowsy  Airway & Oxygen Therapy: Patient Spontanous Breathing and Patient connected to face mask oxygen  Post-op Assessment: Report given to RN and Post -op Vital signs reviewed and stable  Post vital signs: Reviewed and stable  Last Vitals:  Vitals Value Taken Time  BP 139/91 10/09/2018  8:26 AM  Temp    Pulse 102 10/09/2018  8:26 AM  Resp 20 10/09/2018  8:26 AM  SpO2 96 % 10/09/2018  8:26 AM  Vitals shown include unvalidated device data.  Last Pain:  Vitals:   10/09/18 0525  TempSrc: Oral  PainSc:       Patients Stated Pain Goal: 0 (10/05/18 1508)  Complications: No apparent anesthesia complications

## 2018-10-09 NOTE — Progress Notes (Addendum)
Pt received back to [address] room 22. Wound vac on left leg, set to 125, no output at this time. Foley in place, will monitor output. Pt on 2L [place]. Placed back on telemetry, reading sinus tachy, low 100s at this time. Pt able to say her name but not answer any other questions. She repeats phrases even without questions or input "yes ma'am, that's right". Pt opens eyes, follows with eyes.  1005: Pt accepted a spoonful of water after attempted to take a drink with a straw and being unable to. She would not accept a second. Pt swallowed well, no s/s aspiration or difficulty.  Pt answered her name and correct birthday. Still repeating phrases. Unable to follow commands to squeeze hand. Right hand somewhat contracted, able to perform limited PROM.

## 2018-10-09 NOTE — Anesthesia Procedure Notes (Signed)
Procedure Name: Intubation Date/Time: 10/09/2018 7:39 AM Performed by: Bryson Corona, CRNA Pre-anesthesia Checklist: Patient identified, Emergency Drugs available, Suction available and Patient being monitored Patient Re-evaluated:Patient Re-evaluated prior to induction Oxygen Delivery Method: Circle System Utilized Preoxygenation: Pre-oxygenation with 100% oxygen Induction Type: IV induction and Rapid sequence Laryngoscope Size: Mac and 3 Grade View: Grade I Tube type: Oral Tube size: 7.0 mm Number of attempts: 1 Airway Equipment and Method: Stylet and Oral airway Placement Confirmation: ETT inserted through vocal cords under direct vision,  positive ETCO2 and breath sounds checked- equal and bilateral Secured at: 22 cm Tube secured with: Tape Dental Injury: Teeth and Oropharynx as per pre-operative assessment

## 2018-10-09 NOTE — Progress Notes (Addendum)
Pt moaning, having pain. Foley out put is low (350cc this shift), MD made aware. Prn Morphine given, also reassured patient. Will continue to monitor. No output from wound vac.
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
## 2018-10-09 NOTE — Progress Notes (Signed)
  Date: 10/09/2018  Patient name: Whitney Steele  Medical record number: [id number]  Date of birth: 01/17/1946   I have seen and evaluated this patient and I have discussed the plan of care with the house staff. Please see Dr. Al Decant note for complete details. I concur with her findings and plan.    Inez Catalina, MD 10/09/2018, 4:20 PM

## 2018-10-09 NOTE — Progress Notes (Signed)
SLP Cancellation Note  Patient Details Name: Whitney Steele MRN: [id number] DOB: February 19, 1946 Cancelled treatment:       Reason Eval/Treat Not Completed: Medical issues which prohibited therapy. Patient underwent L AKA this AM. SLP will return next date.     Whitney Steele 10/09/2018, 10:39 AM    Angela Nevin, MA, CCC-SLP Speech Therapy [facility] Acute Rehab Pager: [phone]
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
## 2018-10-09 NOTE — Anesthesia Preprocedure Evaluation (Addendum)
Anesthesia Evaluation  Patient identified by MRN, date of birth, ID band Patient awake    Reviewed: Allergy & Precautions, H&P , NPO status , Patient's Chart, lab work & pertinent test results  Airway Mallampati: II  TM Distance: >3 FB Neck ROM: Full    Dental no notable dental hx. (+) Teeth Intact, Dental Advisory Given   Pulmonary COPD,  COPD inhaler,    Pulmonary exam normal breath sounds clear to auscultation       Cardiovascular hypertension, Pt. on medications  Rhythm:Regular Rate:Normal     Neuro/Psych Anxiety Depression Dementia negative neurological ROS     GI/Hepatic Neg liver ROS, GERD  Medicated and Controlled,  Endo/Other  Hypothyroidism   Renal/GU negative Renal ROS  negative genitourinary   Musculoskeletal  (+) Arthritis , Rheumatoid disorders,    Abdominal   Peds  Hematology  (+) Blood dyscrasia, anemia ,   Anesthesia Other Findings   Reproductive/Obstetrics negative OB ROS                            Anesthesia Physical Anesthesia Plan  ASA: III  Anesthesia Plan: General   Post-op Pain Management:    Induction: Intravenous  PONV Risk Score and Plan: 4 or greater and Ondansetron and Dexamethasone  Airway Management Planned: Oral ETT  Additional Equipment:   Intra-op Plan:   Post-operative Plan: Extubation in OR  Informed Consent: I have reviewed the patients History and Physical, chart, labs and discussed the procedure including the risks, benefits and alternatives for the proposed anesthesia with the patient or authorized representative who has indicated his/her understanding and acceptance.   Patient has DNR.  Suspend DNR.   Dental advisory given  Plan Discussed with: CRNA  Anesthesia Plan Comments:        Anesthesia Quick Evaluation

## 2018-10-10 ENCOUNTER — Encounter (HOSPITAL_COMMUNITY): Payer: Self-pay | Admitting: Orthopedic Surgery

## 2018-10-10 DIAGNOSIS — S78112A Complete traumatic amputation at level between left hip and knee, initial encounter: Secondary | ICD-10-CM | POA: Diagnosis present

## 2018-10-10 DIAGNOSIS — Z978 Presence of other specified devices: Secondary | ICD-10-CM

## 2018-10-10 DIAGNOSIS — M8668 Other chronic osteomyelitis, other site: Secondary | ICD-10-CM

## 2018-10-10 DIAGNOSIS — R410 Disorientation, unspecified: Secondary | ICD-10-CM | POA: Diagnosis present

## 2018-10-10 DIAGNOSIS — Z9981 Dependence on supplemental oxygen: Secondary | ICD-10-CM

## 2018-10-10 LAB — BASIC METABOLIC PANEL
Anion gap: 10 (ref 5–15)
BUN: 24 mg/dL — ABNORMAL HIGH (ref 8–23)
CO2: 24 mmol/L (ref 22–32)
Calcium: 8.4 mg/dL — ABNORMAL LOW (ref 8.9–10.3)
Chloride: 101 mmol/L (ref 98–111)
Creatinine, Ser: 0.91 mg/dL (ref 0.44–1.00)
GFR calc Af Amer: >60 mL/min (ref >60)
GFR calc non Af Amer: >60 mL/min (ref >60)
Glucose, Bld: 111 mg/dL — ABNORMAL HIGH (ref 70–99)
Potassium: 3.7 mmol/L (ref 3.5–5.1)
Sodium: 135 mmol/L (ref 135–145)

## 2018-10-10 LAB — CULTURE, BLOOD (ROUTINE X 2)
Culture: NO GROWTH
Culture: NO GROWTH
Special Requests: ADEQUATE
Special Requests: ADEQUATE

## 2018-10-10 LAB — CBC
HCT: 28.2 % — ABNORMAL LOW (ref 36.0–46.0)
Hemoglobin: 9 g/dL — ABNORMAL LOW (ref 12.0–15.0)
MCH: 28.9 pg (ref 26.0–34.0)
MCHC: 31.9 g/dL (ref 30.0–36.0)
MCV: 90.7 fL (ref 80.0–100.0)
Platelets: 248 10*3/uL (ref 150–400)
RBC: 3.11 MIL/uL — ABNORMAL LOW (ref 3.87–5.11)
RDW: 16.1 % — ABNORMAL HIGH (ref 11.5–15.5)
WBC: 8.1 10*3/uL (ref 4.0–10.5)
nRBC: 0 % (ref 0.0–0.2)

## 2018-10-10 LAB — MAGNESIUM: Magnesium: 2.2 mg/dL (ref 1.7–2.4)

## 2018-10-10 MED ORDER — MORPHINE SULFATE (PF) 2 MG/ML IV SOLN
2.0000 mg | INTRAVENOUS | Status: DC | PRN
  Administered 2018-10-10 – 2018-10-12 (×6): 2 mg via INTRAVENOUS
  Filled 2018-10-10 (×6): qty 1

## 2018-10-10 MED ORDER — SODIUM CHLORIDE 0.9 % IV BOLUS
1000.0000 mL | Freq: Once | INTRAVENOUS | Status: AC
  Administered 2018-10-10: 1000 mL via INTRAVENOUS

## 2018-10-10 MED ORDER — THIAMINE HCL 100 MG/ML IJ SOLN
100.0000 mg | Freq: Every day | INTRAMUSCULAR | Status: DC
  Administered 2018-10-10 – 2018-10-12 (×3): 100 mg via INTRAVENOUS
  Filled 2018-10-10 (×3): qty 2

## 2018-10-10 MED ORDER — FOLIC ACID 5 MG/ML IJ SOLN
1.0000 mg | Freq: Every day | INTRAMUSCULAR | Status: DC
  Administered 2018-10-10 – 2018-10-11 (×2): 1 mg via INTRAVENOUS
  Filled 2018-10-10 (×4): qty 0.2

## 2018-10-10 MED ORDER — SODIUM CHLORIDE 0.9 % IV SOLN: INTRAVENOUS | Status: DC

## 2018-10-10 MED ORDER — ENOXAPARIN SODIUM 40 MG/0.4ML ~~LOC~~ SOLN
40.0000 mg | SUBCUTANEOUS | Status: DC
  Administered 2018-10-10 – 2018-10-11 (×2): 40 mg via SUBCUTANEOUS
  Filled 2018-10-10 (×2): qty 0.4

## 2018-10-10 MED ORDER — DEXTROSE-NACL 5-0.45 % IV SOLN
INTRAVENOUS | Status: DC
  Administered 2018-10-10 – 2018-10-13 (×6): via INTRAVENOUS

## 2018-10-10 NOTE — Progress Notes (Signed)
  Date: 10/10/2018  Patient name: Whitney Steele  Medical record number: [id number]  Date of birth: May 16, 1946   I have seen and evaluated this patient and I have discussed the plan of care with the house staff. Please see Dr. Evelene Croon' note for complete details. I concur with her findings and plan.     Inez Catalina, MD 10/10/2018, 6:59 PM

## 2018-10-10 NOTE — Progress Notes (Signed)
Physical Therapy Treatment Patient Details Name: Whitney Steele MRN: [id number] DOB: 09-05-1945 Today's Date: 10/10/2018    History of Present Illness Pt is a 73 y/o female admitted secondary to Encephalopathy from Delirium and Vascular Dementia. Pt with increased drainage at L BKA and per notes.  PMH includes LLE BKA, dementia, COPD, and CAD. Pt underwent L AKA 10/09/18.     PT Comments    Pt now s/p L AKA, no functional change since sx but certainly in pain throughout mobility. Strong R lean in sitting away from surgical LE. Required tot A +2 for sup<>sit and resisting most efforts to get her to mobilize. PT will continue to follow.    Follow Up Recommendations  SNF;Supervision/Assistance - 24 hour     Equipment Recommendations  None recommended by PT    Recommendations for Other Services       Precautions / Restrictions Precautions Precautions: Fall Restrictions Weight Bearing Restrictions: Yes LLE Weight Bearing: Non weight bearing    Mobility  Bed Mobility Overal bed mobility: Needs Assistance Bed Mobility: Supine to Sit;Sit to Supine     Supine to sit: Total assist;+2 for physical assistance Sit to supine: Total assist;+2 for physical assistance   General bed mobility comments: pt requires 2+ total assist for all bed mobility at this time s/p L AKA; R lateral lean while seated EOB and resistive to sitting at midline   Transfers                 General transfer comment: NT due to safety   Ambulation/Gait                 Stairs             Wheelchair Mobility    Modified Rankin (Stroke Patients Only)       Balance Overall balance assessment: Needs assistance Sitting-balance support: Bilateral upper extremity supported;Feet supported Sitting balance-Leahy Scale: Zero Sitting balance - Comments: Reliant on max to total A to maintain sitting balance this session. Pt with R lateral lean in sitting.                                      Cognition Arousal/Alertness: Lethargic Behavior During Therapy: Flat affect Overall Cognitive Status: Difficult to assess                                 General Comments: pt able to state her name and birthday, but otherwise did not answer questions or engage with therapists; keeps eyes closed during majority of session      Exercises      General Comments General comments (skin integrity, edema, etc.): pt not following commands through session. O2 removed for sitting EOB. 89% after session, 2L reapplied      Pertinent Vitals/Pain Pain Assessment: Faces Faces Pain Scale: Hurts little more Pain Location: generalized, LAKA Pain Descriptors / Indicators: Discomfort;Grimacing;Guarding Pain Intervention(s): Limited activity within patient's tolerance;Repositioned    Home Living                      Prior Function            PT Goals (current goals can now be found in the care plan section) Acute Rehab PT Goals Patient Stated Goal: none stated PT Goal Formulation: Patient unable to participate in  goal setting Time For Goal Achievement: 10/22/18 Potential to Achieve Goals: Fair Progress towards PT goals: Not progressing toward goals - comment(lethargy, pain, cognition)    Frequency    Min 2X/week      PT Plan Current plan remains appropriate    Co-evaluation PT/OT/SLP Co-Evaluation/Treatment: Yes Reason for Co-Treatment: For patient/therapist safety;Complexity of the patient's impairments (multi-system involvement);Necessary to address cognition/behavior during functional activity PT goals addressed during session: Mobility/safety with mobility;Balance OT goals addressed during session: ADL's and self-care      AM-PAC PT "6 Clicks" Mobility   Outcome Measure  Help needed turning from your back to your side while in a flat bed without using bedrails?: Total Help needed moving from lying on your back to sitting on the side of  a flat bed without using bedrails?: Total Help needed moving to and from a bed to a chair (including a wheelchair)?: Total Help needed standing up from a chair using your arms (e.g., wheelchair or bedside chair)?: Total Help needed to walk in hospital room?: Total Help needed climbing 3-5 steps with a railing? : Total 6 Click Score: 6    End of Session Equipment Utilized During Treatment: Oxygen Activity Tolerance: Patient limited by lethargy;Patient limited by pain Patient left: in bed;with call bell/phone within reach;with bed alarm set Nurse Communication: Mobility status PT Visit Diagnosis: Unsteadiness on feet (R26.81);Muscle weakness (generalized) (M62.81);Difficulty in walking, not elsewhere classified (R26.2)     Time: 8850-2774 PT Time Calculation (min) (ACUTE ONLY): 23 min  Charges:  $Neuromuscular Re-education: 8-22 mins                     Lyanne Co, PT  Acute Rehab Services  Pager [phone] Office [phone]    Lawana Chambers Ariday Brinker 10/10/2018, 11:27 AM

## 2018-10-10 NOTE — Progress Notes (Signed)
Occupational Therapy Treatment Patient Details Name: Whitney Steele MRN: [id number] DOB: 1945/07/07 Today's Date: 10/10/2018    History of present illness Pt is a 73 y/o female admitted secondary to Encephalopathy from Delirium and Vascular Dementia. Pt with increased drainage at L BKA and per notes.  PMH includes LLE BKA, dementia, COPD, and CAD. Pt underwent L AKA 10/09/18.    OT comments  Pt s/p L AKA.  Continues to requires +2 total assist for bed mobility, max-total assist to maintain sitting EOB with heavy R lateral lean and resistant to transition to midline sitting.  Able to follow command to wash face using L UE, able to state name and birthday but otherwise not following commands.  Greatly limited by pain (anticipate in) L LE.  DC plan remains appropriate. Will follow.    Follow Up Recommendations  SNF;Supervision/Assistance - 24 hour    Equipment Recommendations  Other (comment)(TBD at next venue of care)    Recommendations for Other Services      Precautions / Restrictions Precautions Precautions: Fall Restrictions Weight Bearing Restrictions: Yes LLE Weight Bearing: Non weight bearing       Mobility Bed Mobility Overal bed mobility: Needs Assistance Bed Mobility: Supine to Sit;Sit to Supine     Supine to sit: Total assist;+2 for physical assistance Sit to supine: Total assist;+2 for physical assistance   General bed mobility comments: pt requires 2+ total assist for all bed mobility at this time s/p L AKA; R lateral lean while seated EOB and resistive to sitting at midline   Transfers                 General transfer comment: NT due to safety     Balance Overall balance assessment: Needs assistance Sitting-balance support: Bilateral upper extremity supported;Feet supported Sitting balance-Leahy Scale: Zero Sitting balance - Comments: Reliant on max to total A to maintain sitting balance this session. Pt with R lateral lean in sitting.                                    ADL either performed or assessed with clinical judgement   ADL Overall ADL's : Needs assistance/impaired     Grooming: Maximal assistance;Sitting Grooming Details (indicate cue type and reason): max assist +1 to sit EOB, able to engage in washing face using L UE but overall requires maximal assist                    Toilet Transfer Details (indicate cue type and reason): deferred due to safety          Functional mobility during ADLs: Total assistance General ADL Comments: limited to EOB only, requires total assist for bed mobility      Vision   Additional Comments: needs further assessment, maintains eyes closed throughout session   Perception     Praxis      Cognition Arousal/Alertness: Lethargic Behavior During Therapy: Flat affect Overall Cognitive Status: Difficult to assess                                 General Comments: pt able to state her name and birthday, but otherwise did not answer questions or engage with therapists; keeps eyes closed during majority of session        Exercises     Shoulder Instructions  General Comments      Pertinent Vitals/ Pain       Pain Assessment: Faces Faces Pain Scale: Hurts little more Pain Location: generalized, LAKA Pain Descriptors / Indicators: Discomfort;Grimacing;Guarding Pain Intervention(s): Monitored during session;Repositioned  Home Living                                          Prior Functioning/Environment              Frequency  Min 3X/week        Progress Toward Goals  OT Goals(current goals can now be found in the care plan section)  Progress towards OT goals: Progressing toward goals  Acute Rehab OT Goals Patient Stated Goal: none stated OT Goal Formulation: Patient unable to participate in goal setting Time For Goal Achievement: 10/22/18 Potential to Achieve Goals: Good  Plan Discharge plan remains  appropriate;Frequency remains appropriate    Co-evaluation    PT/OT/SLP Co-Evaluation/Treatment: Yes Reason for Co-Treatment: Complexity of the patient's impairments (multi-system involvement);For patient/therapist safety;To address functional/ADL transfers;Necessary to address cognition/behavior during functional activity   OT goals addressed during session: ADL's and self-care      AM-PAC OT "6 Clicks" Daily Activity     Outcome Measure   Help from another person eating meals?: Total Help from another person taking care of personal grooming?: A Lot Help from another person toileting, which includes using toliet, bedpan, or urinal?: Total Help from another person bathing (including washing, rinsing, drying)?: Total Help from another person to put on and taking off regular upper body clothing?: Total Help from another person to put on and taking off regular lower body clothing?: Total 6 Click Score: 7    End of Session    OT Visit Diagnosis: Unsteadiness on feet (R26.81);Muscle weakness (generalized) (M62.81);Pain Pain - Right/Left: Left Pain - part of body: Leg   Activity Tolerance Patient limited by lethargy;Patient limited by pain   Patient Left in bed;with call bell/phone within reach;with bed alarm set   Nurse Communication Mobility status        Time: 9937-1696 OT Time Calculation (min): 23 min  Charges: OT General Charges $OT Visit: 1 Visit OT Treatments $Self Care/Home Management : 8-22 mins  Chancy Milroy, OT Acute Rehabilitation Services Pager [phone] Office [phone]    Chancy Milroy 10/10/2018, 10:53 AM

## 2018-10-10 NOTE — Progress Notes (Signed)
CSW received consult for SNF rehab placement. CSW attempted to contact patient spouse to help assist with SNF placement. Patient only oriented to person and will need family assistance to help with discharge planning. Will await callback from husband and proceed accordingly.

## 2018-10-10 NOTE — Plan of Care (Signed)
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
## 2018-10-10 NOTE — Progress Notes (Signed)
Patient has low urine output 50 ml in foley bag. Bladder scan shows 127 ml. Then notified on call MD Criss Alvine and started NS bolus 1000 ml as per order. Will continue monitor the patient.

## 2018-10-11 DIAGNOSIS — Z8781 Personal history of (healed) traumatic fracture: Secondary | ICD-10-CM

## 2018-10-11 LAB — BASIC METABOLIC PANEL
Anion gap: 10 (ref 5–15)
BUN: 15 mg/dL (ref 8–23)
CO2: 24 mmol/L (ref 22–32)
Calcium: 7.9 mg/dL — ABNORMAL LOW (ref 8.9–10.3)
Chloride: 104 mmol/L (ref 98–111)
Creatinine, Ser: 0.56 mg/dL (ref 0.44–1.00)
GFR calc Af Amer: >60 mL/min (ref >60)
GFR calc non Af Amer: >60 mL/min (ref >60)
Glucose, Bld: 99 mg/dL (ref 70–99)
Potassium: 3 mmol/L — ABNORMAL LOW (ref 3.5–5.1)
Sodium: 138 mmol/L (ref 135–145)

## 2018-10-11 LAB — CBC
HCT: 24.8 % — ABNORMAL LOW (ref 36.0–46.0)
HCT: 23.9 % — ABNORMAL LOW (ref 36.0–46.0)
Hemoglobin: 7.8 g/dL — ABNORMAL LOW (ref 12.0–15.0)
Hemoglobin: 7.5 g/dL — ABNORMAL LOW (ref 12.0–15.0)
MCH: 28.6 pg (ref 26.0–34.0)
MCH: 29.2 pg (ref 26.0–34.0)
MCHC: 31.5 g/dL (ref 30.0–36.0)
MCHC: 31.4 g/dL (ref 30.0–36.0)
MCV: 90.8 fL (ref 80.0–100.0)
MCV: 93 fL (ref 80.0–100.0)
Platelets: 188 10*3/uL (ref 150–400)
Platelets: 209 10*3/uL (ref 150–400)
RBC: 2.73 MIL/uL — ABNORMAL LOW (ref 3.87–5.11)
RBC: 2.57 MIL/uL — ABNORMAL LOW (ref 3.87–5.11)
RDW: 16.1 % — ABNORMAL HIGH (ref 11.5–15.5)
RDW: 16.1 % — ABNORMAL HIGH (ref 11.5–15.5)
WBC: 4.9 10*3/uL (ref 4.0–10.5)
WBC: 5.3 10*3/uL (ref 4.0–10.5)
nRBC: 0 % (ref 0.0–0.2)
nRBC: 0 % (ref 0.0–0.2)

## 2018-10-11 MED ORDER — POTASSIUM CHLORIDE 10 MEQ/100ML IV SOLN
10.0000 meq | INTRAVENOUS | Status: AC
  Administered 2018-10-11 (×3): 10 meq via INTRAVENOUS
  Filled 2018-10-11 (×3): qty 100

## 2018-10-11 NOTE — Progress Notes (Signed)
  Date: 10/11/2018  Patient name: Whitney Steele  Medical record number: [id number]  Date of birth: 05-30-1946   I have seen and evaluated this patient and I have discussed the plan of care with the house staff. Please see their note for complete details.   Jessy Oto, M.D., Ph.D. 10/11/2018, 1:14 PM
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
## 2018-10-11 NOTE — Plan of Care (Signed)
  Problem: Education: Goal: Knowledge of General Education information will improve Description Including pain rating scale, medication(s)/side effects and non-pharmacologic comfort measures Outcome: Not Met (add Reason)   Problem: Health Behavior/Discharge Planning: Goal: Ability to manage health-related needs will improve Outcome: Not Met (add Reason)   Problem: Clinical Measurements: Goal: Ability to maintain clinical measurements within normal limits will improve Outcome: Not Met (add Reason) Goal: Will remain free from infection Outcome: Not Met (add Reason) Goal: Diagnostic test results will improve Outcome: Not Met (add Reason) Goal: Respiratory complications will improve Outcome: Not Met (add Reason) Goal: Cardiovascular complication will be avoided Outcome: Not Met (add Reason)   Problem: Activity: Goal: Risk for activity intolerance will decrease Outcome: Not Met (add Reason)   Problem: Nutrition: Goal: Adequate nutrition will be maintained Outcome: Not Met (add Reason)   Problem: Coping: Goal: Level of anxiety will decrease Outcome: Not Met (add Reason)   Problem: Elimination: Goal: Will not experience complications related to bowel motility Outcome: Not Met (add Reason) Goal: Will not experience complications related to urinary retention Outcome: Not Met (add Reason)   Problem: Pain Managment: Goal: General experience of comfort will improve Outcome: Not Met (add Reason)   Problem: Safety: Goal: Ability to remain free from injury will improve Outcome: Not Met (add Reason)   Problem: Skin Integrity: Goal: Risk for impaired skin integrity will decrease Outcome: Not Met (add Reason)

## 2018-10-12 DIAGNOSIS — R1310 Dysphagia, unspecified: Secondary | ICD-10-CM

## 2018-10-12 DIAGNOSIS — L89159 Pressure ulcer of sacral region, unspecified stage: Secondary | ICD-10-CM

## 2018-10-12 DIAGNOSIS — D649 Anemia, unspecified: Secondary | ICD-10-CM

## 2018-10-12 DIAGNOSIS — Z9889 Other specified postprocedural states: Secondary | ICD-10-CM

## 2018-10-12 LAB — BASIC METABOLIC PANEL
Anion gap: 5 (ref 5–15)
BUN: 12 mg/dL (ref 8–23)
CO2: 26 mmol/L (ref 22–32)
Calcium: 7.6 mg/dL — ABNORMAL LOW (ref 8.9–10.3)
Chloride: 106 mmol/L (ref 98–111)
Creatinine, Ser: 0.64 mg/dL (ref 0.44–1.00)
GFR calc Af Amer: >60 mL/min (ref >60)
GFR calc non Af Amer: >60 mL/min (ref >60)
Glucose, Bld: 104 mg/dL — ABNORMAL HIGH (ref 70–99)
Potassium: 3.5 mmol/L (ref 3.5–5.1)
Sodium: 137 mmol/L (ref 135–145)

## 2018-10-12 LAB — CBC
HCT: 21.5 % — ABNORMAL LOW (ref 36.0–46.0)
Hemoglobin: 6.8 g/dL — CL (ref 12.0–15.0)
MCH: 28.8 pg (ref 26.0–34.0)
MCHC: 31.6 g/dL (ref 30.0–36.0)
MCV: 91.1 fL (ref 80.0–100.0)
Platelets: 183 10*3/uL (ref 150–400)
RBC: 2.36 MIL/uL — ABNORMAL LOW (ref 3.87–5.11)
RDW: 15.9 % — ABNORMAL HIGH (ref 11.5–15.5)
WBC: 4.5 10*3/uL (ref 4.0–10.5)
nRBC: 0 % (ref 0.0–0.2)

## 2018-10-12 LAB — HEMOGLOBIN AND HEMATOCRIT, BLOOD
HCT: 29.9 % — ABNORMAL LOW (ref 36.0–46.0)
Hemoglobin: 9.9 g/dL — ABNORMAL LOW (ref 12.0–15.0)

## 2018-10-12 LAB — FERRITIN: Ferritin: 116 ng/mL (ref 11–307)

## 2018-10-12 LAB — IRON AND TIBC
Iron: 16 ug/dL — ABNORMAL LOW (ref 28–170)
Saturation Ratios: 9 % — ABNORMAL LOW (ref 10.4–31.8)
TIBC: 188 ug/dL — ABNORMAL LOW (ref 250–450)
UIBC: 172 ug/dL

## 2018-10-12 LAB — PREPARE RBC (CROSSMATCH)

## 2018-10-12 MED ORDER — SENNOSIDES-DOCUSATE SODIUM 8.6-50 MG PO TABS: 1.0000 | ORAL_TABLET | Freq: Once | ORAL | Status: DC

## 2018-10-12 MED ORDER — LEVETIRACETAM 500 MG PO TABS: 500.0000 mg | ORAL_TABLET | Freq: Two times a day (BID) | ORAL | Status: DC

## 2018-10-12 MED ORDER — VITAMIN B-1 100 MG PO TABS
100.0000 mg | ORAL_TABLET | Freq: Every day | ORAL | Status: DC
  Administered 2018-10-13 – 2018-10-14 (×2): 100 mg via ORAL
  Filled 2018-10-12 (×2): qty 1

## 2018-10-12 MED ORDER — ENSURE ENLIVE PO LIQD
237.0000 mL | Freq: Three times a day (TID) | ORAL | Status: DC
  Administered 2018-10-12 – 2018-10-13 (×2): 237 mL via ORAL

## 2018-10-12 MED ORDER — SODIUM CHLORIDE 0.9% IV SOLUTION: Freq: Once | INTRAVENOUS | Status: DC

## 2018-10-12 MED ORDER — FOLIC ACID 1 MG PO TABS
1.0000 mg | ORAL_TABLET | Freq: Every day | ORAL | Status: DC
  Administered 2018-10-13 – 2018-10-14 (×2): 1 mg via ORAL
  Filled 2018-10-12 (×2): qty 1

## 2018-10-12 MED ORDER — CYANOCOBALAMIN 1000 MCG/ML IJ SOLN
1000.0000 ug | Freq: Once | INTRAMUSCULAR | Status: AC
  Administered 2018-10-12: 18:00:00 1000 ug via INTRAMUSCULAR
  Filled 2018-10-12: qty 1

## 2018-10-12 MED ORDER — OXYCODONE-ACETAMINOPHEN 5-325 MG PO TABS
1.0000 | ORAL_TABLET | Freq: Four times a day (QID) | ORAL | Status: DC | PRN
  Administered 2018-10-12 – 2018-10-14 (×5): 2 via ORAL
  Filled 2018-10-12 (×5): qty 2

## 2018-10-12 MED ORDER — LEVETIRACETAM 500 MG PO TABS
500.0000 mg | ORAL_TABLET | Freq: Two times a day (BID) | ORAL | Status: DC
  Administered 2018-10-12 – 2018-10-14 (×4): 500 mg via ORAL
  Filled 2018-10-12 (×4): qty 1

## 2018-10-12 MED ORDER — FERROUS SULFATE 300 (60 FE) MG/5ML PO SYRP
300.0000 mg | ORAL_SOLUTION | Freq: Every day | ORAL | Status: DC
  Administered 2018-10-14: 08:00:00 300 mg via ORAL
  Filled 2018-10-12 (×3): qty 5

## 2018-10-12 MED ORDER — SENNOSIDES 8.8 MG/5ML PO SYRP
5.0000 mL | ORAL_SOLUTION | Freq: Two times a day (BID) | ORAL | Status: DC
  Administered 2018-10-13 – 2018-10-14 (×2): 5 mL via ORAL
  Filled 2018-10-12 (×6): qty 5

## 2018-10-12 MED ORDER — POLYETHYLENE GLYCOL 3350 17 G PO PACK
17.0000 g | PACK | Freq: Every day | ORAL | Status: DC
  Administered 2018-10-13 – 2018-10-14 (×2): 17 g via ORAL
  Filled 2018-10-12 (×2): qty 1
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
## 2018-10-12 NOTE — NC FL2 (Signed)
[place] MEDICAID FL2 LEVEL OF CARE SCREENING TOOL     IDENTIFICATION  Patient Name: Whitney Steele Birthdate: 10/23/1945 Sex: female Admission Date (Current Location): 10/05/2018  [facility] and [place] Number:  [employer] and Address:  [facility], [address] [place], [place] [address]      Provider Number: [id number]  Attending Physician Name and Address:  Burns Spain, MD  Relative Name and Phone Number:  Maurine Minister, spouse, [phone]    Current Level of Care: Hospital Recommended Level of Care: Skilled Nursing Facility Prior Approval Number:    Date Approved/Denied:   PASRR Number: [id number]  Discharge Plan: SNF    Current Diagnoses: Patient Active Problem List   Diagnosis Date Noted  . Confusion   . Unilateral AKA, left (HCC)   . Sacral ulcer (HCC) 10/06/2018  . 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 Cellulitis of left lower extremity   . Altered mental status 10/05/2018    Orientation RESPIRATION BLADDER Height & Weight     Self, Time, Place  O2(Nasal cannula 2L) Incontinent, Indwelling catheter Weight: 111 lb 5.3 oz (50.5 kg) Height:  5\' 4"  (162.6 cm)  BEHAVIORAL SYMPTOMS/MOOD NEUROLOGICAL BOWEL NUTRITION STATUS      Continent Diet(Please see DC Summary)  AMBULATORY STATUS COMMUNICATION OF NEEDS Skin   Extensive Assist Verbally PU Stage and Appropriate Care, Surgical wounds, Wound Vac(Stage III on sacrum; Praveena wound vac to discharge with patient on leg)                       Personal Care Assistance Level of Assistance  Bathing, Feeding, Dressing Bathing Assistance: Maximum assistance Feeding assistance: Limited assistance Dressing Assistance: Limited assistance     Functional Limitations Info  Sight, Hearing, Speech Sight Info: Adequate Hearing Info: Adequate Speech Info: Adequate    SPECIAL CARE FACTORS FREQUENCY  PT (By licensed PT), OT (By licensed OT),  Speech therapy     PT Frequency: 5x/week OT Frequency: 3x/week     Speech Therapy Frequency: 3x      Contractures Contractures Info: Not present    Additional Factors Info  Code Status, Allergies, Psychotropic Code Status Info: DNR Allergies Info: Iodinated Diagnostic Agents, Tape Psychotropic Info: Cymbalta         Current Medications (10/12/2018):  This is the current hospital active medication list Current Facility-Administered Medications  Medication Dose Route Frequency Provider Last Rate Last Dose  . 0.9 %  sodium chloride infusion (Manually program via Guardrails IV Fluids)   Intravenous Once Synetta Shadow, MD      . acetaminophen (TYLENOL) tablet 325-650 mg  325-650 mg Oral Q6H PRN Nadara Mustard, MD      . aspirin EC tablet 81 mg  81 mg Oral Daily Nadara Mustard, MD   81 mg at 10/12/18 0900  . bisacodyl (DULCOLAX) suppository 10 mg  10 mg Rectal Daily PRN Nadara Mustard, MD      . dextrose 5 %-0.45 % sodium chloride infusion   Intravenous Continuous Burna Cash, MD 100 mL/hr at 10/12/18 0052    . DULoxetine (CYMBALTA) DR capsule 60 mg  60 mg Oral Daily Nadara Mustard, MD   60 mg at 10/12/18 0858  . enoxaparin (LOVENOX) injection 40 mg  40 mg Subcutaneous Q24H Santos-Sanchez, Chelsea Primus, MD   40 mg at 10/11/18 1715  . feeding supplement (ENSURE ENLIVE) (ENSURE ENLIVE) liquid 237 mL  237  mL Oral TID BM Ali Lowe, MD      . feeding supplement (PRO-STAT SUGAR FREE 64) liquid 30 mL  30 mL Oral BID Nadara Mustard, MD   30 mL at 10/12/18 0855  . folic acid (FOLVITE) tablet 1 mg  1 mg Oral Daily Burns Spain, MD      . levETIRAcetam (KEPPRA) tablet 500 mg  500 mg Oral BID Burns Spain, MD      . magnesium citrate solution 1 Bottle  1 Bottle Oral Once PRN Nadara Mustard, MD      . methocarbamol (ROBAXIN) 500 mg in dextrose 5 % 50 mL IVPB  500 mg Intravenous Q6H PRN Nadara Mustard, MD      . metoCLOPramide (REGLAN) injection 5-10 mg  5-10 mg Intravenous  Q8H PRN Nadara Mustard, MD      . morphine 2 MG/ML injection 2 mg  2 mg Intravenous Q4H PRN Burna Cash, MD   2 mg at 10/12/18 0849  . multivitamin with minerals tablet 1 tablet  1 tablet Oral Daily Nadara Mustard, MD   1 tablet at 10/12/18 0859  . ondansetron (ZOFRAN) injection 4 mg  4 mg Intravenous Q6H PRN Nadara Mustard, MD      . polyethylene glycol (MIRALAX / GLYCOLAX) packet 17 g  17 g Oral Daily PRN Nadara Mustard, MD      . polyethylene glycol (MIRALAX / GLYCOLAX) packet 17 g  17 g Oral Daily Ali Lowe, MD      . ranolazine (RANEXA) 12 hr tablet 500 mg  500 mg Oral BID Nadara Mustard, MD   500 mg at 10/12/18 0858  . rosuvastatin (CRESTOR) tablet 10 mg  10 mg Oral q1800 Nadara Mustard, MD   10 mg at 10/11/18 1717  . senna-docusate (Senokot-S) tablet 1 tablet  1 tablet Oral Once Ali Lowe, MD      . Melene Muller ON 10/13/2018] thiamine (VITAMIN B-1) tablet 100 mg  100 mg Oral Daily Burns Spain, MD         Discharge Medications: Please see discharge summary for a list of discharge medications.  Relevant Imaging Results:  Relevant Lab Results:   Additional Information SSN: [id number]     COVID test in progress  Mearl Latin, LCSW

## 2018-10-12 NOTE — Progress Notes (Signed)
SLP Cancellation Note  Patient Details Name: Whitney Steele MRN: [id number] DOB: [birth year]   Cancelled treatment:       Reason Eval/Treat Not Completed: Patient's level of consciousness . Patient was asleep and unable to be aroused. Will attempt next date.  Elio Forget Tarrell 10/12/2018, 5:50 PM    Angela Nevin, MA, CCC-SLP Speech Therapy [facility] Acute Rehab Pager: [phone]

## 2018-10-12 NOTE — Progress Notes (Addendum)
Blood started at 1028. Consent obtained - pt is alert and oriented, able to discuss benefits and risks. Pre blood vitals below. Educated pt on potential s/s of transfusion reaction. I am in the room at the beginning and staying for the first 15 minutes of transfusion.  BP 114/60   Pulse 80   Temp 97.8 F (36.6 C) (Oral)   Resp 18   Ht 5\' 4"  (1.626 m)   Wt 50.5 kg   SpO2 100%   BMI 19.11 kg/m   1047: VS taken, temp a touch low but not much change from pre blood. Pt also breathing with mouth open. Pt denies any s/s transfusion reaction, no shortness of breath, lung sounds remain clear, no pain in chest or abdomen. VS below. Will conitnue to monitoir.  BP (!) 112/56   Pulse 73   Temp (!) 97.4 F (36.3 C) (Oral) Comment: pt breathing iwht mouth open  Resp 18   Ht 5\' 4"  (1.626 m)   Wt 50.5 kg   SpO2 100%   BMI 19.11 kg/m

## 2018-10-12 NOTE — Progress Notes (Signed)
CRITICAL VALUE ALERT  Critical Value:  Hemoglobin 6.8  Date & Time Notied:  10/12/2018 4:38a  Provider Notified: Internal Medicine  Orders Received/Actions taken: Transfusion 1 unit

## 2018-10-12 NOTE — Progress Notes (Signed)
  Date: 10/12/2018  Patient name: Whitney Steele  Medical record number: [id number]  Date of birth: 10-28-1945   I have seen and evaluated this patient and I have discussed the plan of care with the house staff. Please see their note for complete details. I concur with their findings with the following additions/corrections: Ms. Schelle was seen this morning on team rounds.  I agree with Dr. Al Decant plan to start oral iron in addition to transfusing PRBCs.  It appears the family's intention is to transfer her to a nursing facility once she is stable.  Burns Spain, MD 10/12/2018, 1:42 PM
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
## 2018-10-12 NOTE — TOC Progression Note (Signed)
Transition of Care [facility]) - Progression Note    Patient Details  Name: [name] MRN: [id number] Date of Birth: Jun 27, 1945  [facility]) CM/SW Contact  Mearl Latin, LCSW Phone Number: 10/12/2018, 3:18 PM  Clinical Narrative:    CSW also spoke with patient's daughter as well to provide bed offers. She will speak with her stepdad and let CSW know. She reported concerns over not being able to tour the facilities.    Expected Discharge Plan: Skilled Nursing Facility Barriers to Discharge: Continued Medical Work up  Expected Discharge Plan and Services Expected Discharge Plan: Skilled Nursing Facility In-house Referral: Clinical Social Work Discharge Planning Services: NA Post Acute Care Choice: Skilled Nursing Facility Living arrangements for the past 2 months: Single Family Home                 DME Arranged: N/A DME Agency: NA       HH Arranged: NA HH Agency: NA         Social Determinants of Health (SDOH) Interventions    Readmission Risk Interventions Readmission Risk Prevention Plan 10/12/2018  Transportation Screening Complete  PCP or Specialist Appt within 5-7 Days Complete  Home Care Screening Complete  Medication Review (RN CM) Complete  Some recent data might be hidden

## 2018-10-12 NOTE — Progress Notes (Signed)
Patient ID: Whitney Steele, female   DOB: 10-May-1946, 73 y.o.   MRN: [id number] Postoperative day 3 left above-the-knee amputation.  There is no drainage in the wound VAC canister.  Anticipate discharge to skilled nursing.  Patient denies any pain.

## 2018-10-12 NOTE — Care Management Important Message (Signed)
Important Message  Patient Details  Name: Whitney Steele MRN: [id number] Date of Birth: 02/26/1946   Medicare Important Message Given:  Yes    [name] Stefan Church 10/12/2018, 3:55 PM

## 2018-10-13 LAB — TYPE AND SCREEN
ABO/RH(D): O POS
Antibody Screen: NEGATIVE
Unit division: 0

## 2018-10-13 LAB — BPAM RBC
Blood Product Expiration Date: 202005162359
ISSUE DATE / TIME: 202005041000
Unit Type and Rh: 5100

## 2018-10-13 LAB — BASIC METABOLIC PANEL
Anion gap: 9 (ref 5–15)
BUN: 15 mg/dL (ref 8–23)
CO2: 26 mmol/L (ref 22–32)
Calcium: 7.9 mg/dL — ABNORMAL LOW (ref 8.9–10.3)
Chloride: 104 mmol/L (ref 98–111)
Creatinine, Ser: 0.63 mg/dL (ref 0.44–1.00)
GFR calc Af Amer: >60 mL/min (ref >60)
GFR calc non Af Amer: >60 mL/min (ref >60)
Glucose, Bld: 109 mg/dL — ABNORMAL HIGH (ref 70–99)
Potassium: 3.4 mmol/L — ABNORMAL LOW (ref 3.5–5.1)
Sodium: 139 mmol/L (ref 135–145)

## 2018-10-13 LAB — MAGNESIUM: Magnesium: 1.6 mg/dL — ABNORMAL LOW (ref 1.7–2.4)

## 2018-10-13 LAB — NOVEL CORONAVIRUS, NAA (HOSP ORDER, SEND-OUT TO REF LAB; TAT 18-24 HRS): SARS-CoV-2, NAA: NOT DETECTED

## 2018-10-13 LAB — CBC
HCT: 27.7 % — ABNORMAL LOW (ref 36.0–46.0)
Hemoglobin: 9.1 g/dL — ABNORMAL LOW (ref 12.0–15.0)
MCH: 29.6 pg (ref 26.0–34.0)
MCHC: 32.9 g/dL (ref 30.0–36.0)
MCV: 90.2 fL (ref 80.0–100.0)
Platelets: 206 10*3/uL (ref 150–400)
RBC: 3.07 MIL/uL — ABNORMAL LOW (ref 3.87–5.11)
RDW: 16.3 % — ABNORMAL HIGH (ref 11.5–15.5)
WBC: 4.4 10*3/uL (ref 4.0–10.5)
nRBC: 0 % (ref 0.0–0.2)

## 2018-10-13 LAB — PHOSPHORUS: Phosphorus: 2.4 mg/dL — ABNORMAL LOW (ref 2.5–4.6)

## 2018-10-13 MED ORDER — ENOXAPARIN SODIUM 40 MG/0.4ML ~~LOC~~ SOLN
40.0000 mg | SUBCUTANEOUS | Status: DC
  Administered 2018-10-13 – 2018-10-14 (×2): 40 mg via SUBCUTANEOUS
  Filled 2018-10-13 (×2): qty 0.4

## 2018-10-13 MED ORDER — POTASSIUM CHLORIDE 20 MEQ/15ML (10%) PO SOLN
40.0000 meq | Freq: Once | ORAL | Status: AC
  Administered 2018-10-13: 40 meq via ORAL
  Filled 2018-10-13: qty 30

## 2018-10-13 MED ORDER — PRO-STAT SUGAR FREE PO LIQD
30.0000 mL | Freq: Three times a day (TID) | ORAL | Status: DC
  Administered 2018-10-13 – 2018-10-14 (×3): 30 mL via ORAL
  Filled 2018-10-13 (×2): qty 30

## 2018-10-13 MED ORDER — MAGNESIUM SULFATE 2 GM/50ML IV SOLN
2.0000 g | Freq: Once | INTRAVENOUS | Status: AC
  Administered 2018-10-13: 2 g via INTRAVENOUS
  Filled 2018-10-13: qty 50

## 2018-10-13 MED ORDER — ENSURE ENLIVE PO LIQD
237.0000 mL | Freq: Three times a day (TID) | ORAL | Status: DC
  Administered 2018-10-14 (×2): 237 mL via ORAL

## 2018-10-13 MED ORDER — POTASSIUM CHLORIDE 20 MEQ PO PACK
40.0000 meq | PACK | Freq: Once | ORAL | Status: DC
  Filled 2018-10-13 (×2): qty 2

## 2018-10-13 NOTE — Progress Notes (Signed)
  Date: 10/13/2018  Patient name: Whitney Steele  Medical record number: [id number]  Date of birth: [birth year]   I have seen and evaluated this patient and I have discussed the plan of care with the house staff. Please see their note for complete details. I concur with their findings with the following additions/corrections: [name] was seen this morning on team rounds.  Medical issues are stable and we are awaiting SNF placement.  Burns Spain, MD 10/13/2018, 1:39 PM

## 2018-10-13 NOTE — Progress Notes (Signed)
Pt into bed, said she would like to lay down. 2 person assistance needed as pt is very unsteady, is tender and avoids putting weight on left leg. Used [employer].   Pt sipping liquid potassium, says she will try to drink it all. Pt has finished an Ensure and ate 75% of her lunch tray including all of the hamburger patty.

## 2018-10-13 NOTE — Progress Notes (Signed)
Occupational Therapy Treatment Patient Details Name: Whitney Steele MRN: [id number] DOB: Feb 03, 1946 Today's Date: 10/13/2018    History of present illness Pt is a 73 y/o female admitted secondary to Encephalopathy from Delirium and Vascular Dementia. Pt with increased drainage at L BKA and per notes.  PMH includes LLE BKA, dementia, COPD, and CAD. Pt underwent L AKA 10/09/18.    OT comments  Pt progressing slowly towards established OT goals. Pt with increased arousal this session and following increased cues. Pt requiring Min-Mod A for rolling in bed demonstrating increased tolerance with increased movement. Pt requiring Total A +2 to sit upright and requiring Max A for maintaining upright sitting posture due to pain with weight shift to left hip. Pt requiring Total A +2 for lateral scoot to drop arm recliner. Continue to recommend dc to SNF and will continue to follow acutely as admitted.    Follow Up Recommendations  SNF;Supervision/Assistance - 24 hour    Equipment Recommendations  Other (comment)(TBD at next venue of care)    Recommendations for Other Services      Precautions / Restrictions Precautions Precautions: Fall Restrictions Weight Bearing Restrictions: Yes LLE Weight Bearing: Non weight bearing       Mobility Bed Mobility Overal bed mobility: Needs Assistance Bed Mobility: Rolling;Sidelying to Sit Rolling: Min assist;Mod assist;+2 for safety/equipment Sidelying to sit: Total assist;+2 for physical assistance;+2 for safety/equipment       General bed mobility comments: Pt able to roll left and right with initial Mod and progressing to Min A. Pt yelling out with pain during roll but continued to particiapte. Pt requiring Total A to bring trunk into seated position at EOB and presents with poor tolerance of weight shift to left hip.  Transfers Overall transfer level: Needs assistance   Transfers: Lateral/Scoot Transfers          Lateral/Scoot Transfers: Max  assist;Total assist;+2 physical assistance;+2 safety/equipment;From elevated surface General transfer comment: Max-Total A +2 to lateral scoot to right towards drop arm recliner. Pt with anxiety and required Max cues for encouragement and to participate    Balance Overall balance assessment: Needs assistance Sitting-balance support: Bilateral upper extremity supported;Feet supported Sitting balance-Leahy Scale: Poor Sitting balance - Comments: Pt requiring Max A for sitting support and presented with poor tolerance of wieght shift to left hip. Pt able to achieve sitting at EOB with Min Guard A with left hand hold rail.                                    ADL either performed or assessed with clinical judgement   ADL Overall ADL's : Needs assistance/impaired                         Toilet Transfer: Maximal assistance;Total assistance;+2 for physical assistance;+2 for safety/equipment;Cueing for sequencing;Transfer board(Simulated to recliner) Toilet Transfer Details (indicate cue type and reason): Lateral scoot to drop arm recliner to simulate toilet transfer. Requiring Max-total A +2 to laterally scoot to right Toileting- Clothing Manipulation and Hygiene: Total assistance Toileting - Clothing Manipulation Details (indicate cue type and reason): Total A to clean up after urinary incontience at bed level. Pt able to participate in rolling     Functional mobility during ADLs: Total assistance General ADL Comments: limited to EOB only, requires total assist for bed mobility      Vision   Vision Assessment?: Vision  impaired- to be further tested in functional context   Perception     Praxis      Cognition Arousal/Alertness: Awake/alert Behavior During Therapy: Flat affect;Anxious Overall Cognitive Status: History of cognitive impairments - at baseline                                 General Comments: Baseline dementia. Increased arousal this  session and participating in conversation. Pt very agreeable but became more axious about EOB movement. Requiring increased cues. Pt not oriented to time but able to report why she was in the hospital saying "that left leg was giving me problems. And I get rid of my problems."        Exercises     Shoulder Instructions       General Comments Pt with urinary incontience and bed saturated apon arrival. Pt with foley cath; notified RN    Pertinent Vitals/ Pain       Pain Assessment: Faces Faces Pain Scale: Hurts little more Pain Location: generalized, LAKA Pain Descriptors / Indicators: Discomfort;Grimacing;Guarding Pain Intervention(s): Monitored during session;Limited activity within patient's tolerance;Repositioned  Home Living                                          Prior Functioning/Environment              Frequency  Min 3X/week        Progress Toward Goals  OT Goals(current goals can now be found in the care plan section)  Progress towards OT goals: Progressing toward goals  Acute Rehab OT Goals Patient Stated Goal: none stated OT Goal Formulation: Patient unable to participate in goal setting Time For Goal Achievement: 10/22/18 Potential to Achieve Goals: Good ADL Goals Pt Will Perform Grooming: with modified independence;sitting Pt Will Perform Upper Body Dressing: with min guard assist;sitting Pt Will Transfer to Toilet: with min assist;squat pivot transfer Additional ADL Goal #1: pt will perform transfer to commode or recliner with minA overall and 100% command following.  Plan Discharge plan remains appropriate;Frequency remains appropriate    Co-evaluation    PT/OT/SLP Co-Evaluation/Treatment: Yes Reason for Co-Treatment: Complexity of the patient's impairments (multi-system involvement);For patient/therapist safety;To address functional/ADL transfers   OT goals addressed during session: ADL's and self-care      AM-PAC OT "6  Clicks" Daily Activity     Outcome Measure   Help from another person eating meals?: A Lot Help from another person taking care of personal grooming?: A Lot Help from another person toileting, which includes using toliet, bedpan, or urinal?: Total Help from another person bathing (including washing, rinsing, drying)?: Total Help from another person to put on and taking off regular upper body clothing?: Total Help from another person to put on and taking off regular lower body clothing?: Total 6 Click Score: 8    End of Session    OT Visit Diagnosis: Unsteadiness on feet (R26.81);Muscle weakness (generalized) (M62.81);Pain Pain - Right/Left: Left Pain - part of body: Leg   Activity Tolerance Patient limited by pain   Patient Left with call bell/phone within reach;in chair;with chair alarm set   Nurse Communication Mobility status        Time: [id number] OT Time Calculation (min): 39 min  Charges: OT General Charges $OT Visit: 1 Visit OT Treatments $Self Care/Home Management :  23-37 mins  Amire Leazer MSOT, OTR/L Acute Rehab Pager: [phone] Office: [phone]   Theodoro Grist Nasira Janusz 10/13/2018, 11:49 AM
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
## 2018-10-13 NOTE — Progress Notes (Signed)
Physical Therapy Treatment Patient Details Name: Whitney Steele MRN: [id number] DOB: Jul 12, 1945 Today's Date: 10/13/2018    History of Present Illness Pt is a 73 y/o female admitted secondary to Encephalopathy from Delirium and Vascular Dementia. Pt with increased drainage at L BKA and per notes.  PMH includes LLE BKA, dementia, COPD, and CAD. Pt underwent L AKA 10/09/18.     PT Comments    Patient seen for mobility progression. Pt requires max/total A +2 for lateral scoot transfer OOB this session. Continue to progress as tolerated with anticipated d/c to SNF for further skilled PT services.     Follow Up Recommendations  SNF;Supervision/Assistance - 24 hour     Equipment Recommendations  None recommended by PT    Recommendations for Other Services       Precautions / Restrictions Precautions Precautions: Fall Restrictions Weight Bearing Restrictions: Yes LLE Weight Bearing: Non weight bearing    Mobility  Bed Mobility Overal bed mobility: Needs Assistance Bed Mobility: Rolling;Sidelying to Sit Rolling: Min assist;Mod assist;+2 for safety/equipment Sidelying to sit: Total assist;+2 for physical assistance;+2 for safety/equipment       General bed mobility comments: Pt able to roll left and right with initial Mod and progressing to Min A. Pt yelling out with pain during roll but continued to particiapte. Pt requiring Total A to bring trunk into seated position at EOB and presents with poor tolerance of weight shift to left hip.  Transfers Overall transfer level: Needs assistance   Transfers: Lateral/Scoot Transfers          Lateral/Scoot Transfers: Max assist;Total assist;+2 physical assistance;+2 safety/equipment;From elevated surface General transfer comment: Max-Total A +2 to lateral scoot to right towards drop arm recliner. Pt with anxiety and required Max cues for encouragement and to participate  Ambulation/Gait                 Stairs              Wheelchair Mobility    Modified Rankin (Stroke Patients Only)       Balance Overall balance assessment: Needs assistance Sitting-balance support: Bilateral upper extremity supported;Feet supported Sitting balance-Leahy Scale: Poor Sitting balance - Comments: Pt requiring Max A for sitting support and presented with poor tolerance of wieght shift to left hip. Pt able to achieve sitting at EOB with Min Guard A with left hand hold rail.                                     Cognition Arousal/Alertness: Awake/alert Behavior During Therapy: Flat affect;Anxious Overall Cognitive Status: History of cognitive impairments - at baseline                                 General Comments: Baseline dementia. Increased arousal this session and participating in conversation. Pt very agreeable but became more axious about EOB movement. Requiring increased cues. Pt not oriented to time but able to report why she was in the hospital saying "that left leg was giving me problems. And I get rid of my problems."      Exercises      General Comments General comments (skin integrity, edema, etc.): Pt with urinary incontience and bed saturated apon arrival. Pt with foley cath; notified RN      Pertinent Vitals/Pain Pain Assessment: Faces Faces Pain Scale: Hurts little more  Pain Location: generalized, LAKA Pain Descriptors / Indicators: Discomfort;Grimacing;Guarding Pain Intervention(s): Limited activity within patient's tolerance;Monitored during session;Repositioned;Patient requesting pain meds-RN notified    Home Living                      Prior Function            PT Goals (current goals can now be found in the care plan section) Acute Rehab PT Goals Patient Stated Goal: none stated Progress towards PT goals: Progressing toward goals    Frequency    Min 2X/week      PT Plan Current plan remains appropriate    Co-evaluation PT/OT/SLP  Co-Evaluation/Treatment: Yes Reason for Co-Treatment: Complexity of the patient's impairments (multi-system involvement);For patient/therapist safety;To address functional/ADL transfers   OT goals addressed during session: ADL's and self-care      AM-PAC PT "6 Clicks" Mobility   Outcome Measure  Help needed turning from your back to your side while in a flat bed without using bedrails?: A Lot Help needed moving from lying on your back to sitting on the side of a flat bed without using bedrails?: Total Help needed moving to and from a bed to a chair (including a wheelchair)?: A Lot Help needed standing up from a chair using your arms (e.g., wheelchair or bedside chair)?: Total Help needed to walk in hospital room?: Total Help needed climbing 3-5 steps with a railing? : Total 6 Click Score: 8    End of Session Equipment Utilized During Treatment: Oxygen Activity Tolerance: Patient limited by pain Patient left: in chair;with call bell/phone within reach;with chair alarm set Nurse Communication: Mobility status;Patient requests pain meds PT Visit Diagnosis: Unsteadiness on feet (R26.81);Muscle weakness (generalized) (M62.81);Difficulty in walking, not elsewhere classified (R26.2)     Time: 5701-7793 PT Time Calculation (min) (ACUTE ONLY): 39 min  Charges:  $Therapeutic Activity: 8-22 mins                     Erline Levine, PTA Acute Rehabilitation Services Pager: [phone] Office: [phone]     Carolynne Edouard 10/13/2018, 1:17 PM
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
## 2018-10-13 NOTE — Progress Notes (Signed)
Pt up to chair with PT, states feels she needs to have a BM. Attempted to give medications mixed together as pt still has little appetite. Pt took one sip and said if she took any more she would throw up. Pt willing to take Miralax plain and willing to try Klor powder by itself. Will attempt. Noted pt sipping Ensure Enlive, encouraged this. Pt alert and oriented.

## 2018-10-14 DIAGNOSIS — M1990 Unspecified osteoarthritis, unspecified site: Secondary | ICD-10-CM | POA: Diagnosis not present

## 2018-10-14 DIAGNOSIS — M2550 Pain in unspecified joint: Secondary | ICD-10-CM | POA: Diagnosis not present

## 2018-10-14 DIAGNOSIS — L89154 Pressure ulcer of sacral region, stage 4: Secondary | ICD-10-CM | POA: Diagnosis not present

## 2018-10-14 DIAGNOSIS — J449 Chronic obstructive pulmonary disease, unspecified: Secondary | ICD-10-CM | POA: Diagnosis not present

## 2018-10-14 DIAGNOSIS — Z7409 Other reduced mobility: Secondary | ICD-10-CM | POA: Diagnosis not present

## 2018-10-14 DIAGNOSIS — M6281 Muscle weakness (generalized): Secondary | ICD-10-CM | POA: Diagnosis not present

## 2018-10-14 DIAGNOSIS — R339 Retention of urine, unspecified: Secondary | ICD-10-CM

## 2018-10-14 DIAGNOSIS — R300 Dysuria: Secondary | ICD-10-CM | POA: Diagnosis not present

## 2018-10-14 DIAGNOSIS — Z681 Body mass index (BMI) 19 or less, adult: Secondary | ICD-10-CM

## 2018-10-14 DIAGNOSIS — M069 Rheumatoid arthritis, unspecified: Secondary | ICD-10-CM | POA: Diagnosis not present

## 2018-10-14 DIAGNOSIS — E039 Hypothyroidism, unspecified: Secondary | ICD-10-CM | POA: Diagnosis not present

## 2018-10-14 DIAGNOSIS — Z4781 Encounter for orthopedic aftercare following surgical amputation: Secondary | ICD-10-CM | POA: Diagnosis not present

## 2018-10-14 DIAGNOSIS — L89150 Pressure ulcer of sacral region, unstageable: Secondary | ICD-10-CM | POA: Diagnosis not present

## 2018-10-14 DIAGNOSIS — Z22322 Carrier or suspected carrier of Methicillin resistant Staphylococcus aureus: Secondary | ICD-10-CM | POA: Diagnosis not present

## 2018-10-14 DIAGNOSIS — L89159 Pressure ulcer of sacral region, unspecified stage: Secondary | ICD-10-CM | POA: Diagnosis not present

## 2018-10-14 DIAGNOSIS — I10 Essential (primary) hypertension: Secondary | ICD-10-CM | POA: Diagnosis not present

## 2018-10-14 DIAGNOSIS — Z7401 Bed confinement status: Secondary | ICD-10-CM | POA: Diagnosis not present

## 2018-10-14 DIAGNOSIS — E46 Unspecified protein-calorie malnutrition: Secondary | ICD-10-CM

## 2018-10-14 DIAGNOSIS — Z89612 Acquired absence of left leg above knee: Secondary | ICD-10-CM | POA: Diagnosis not present

## 2018-10-14 DIAGNOSIS — Z741 Need for assistance with personal care: Secondary | ICD-10-CM | POA: Diagnosis not present

## 2018-10-14 DIAGNOSIS — S72002G Fracture of unspecified part of neck of left femur, subsequent encounter for closed fracture with delayed healing: Secondary | ICD-10-CM | POA: Diagnosis not present

## 2018-10-14 DIAGNOSIS — D638 Anemia in other chronic diseases classified elsewhere: Secondary | ICD-10-CM | POA: Diagnosis not present

## 2018-10-14 DIAGNOSIS — Z79899 Other long term (current) drug therapy: Secondary | ICD-10-CM | POA: Diagnosis not present

## 2018-10-14 DIAGNOSIS — M86462 Chronic osteomyelitis with draining sinus, left tibia and fibula: Secondary | ICD-10-CM | POA: Diagnosis not present

## 2018-10-14 DIAGNOSIS — R1310 Dysphagia, unspecified: Secondary | ICD-10-CM | POA: Diagnosis not present

## 2018-10-14 DIAGNOSIS — D509 Iron deficiency anemia, unspecified: Secondary | ICD-10-CM | POA: Diagnosis not present

## 2018-10-14 DIAGNOSIS — R5381 Other malaise: Secondary | ICD-10-CM | POA: Diagnosis not present

## 2018-10-14 DIAGNOSIS — R2689 Other abnormalities of gait and mobility: Secondary | ICD-10-CM | POA: Diagnosis not present

## 2018-10-14 DIAGNOSIS — R319 Hematuria, unspecified: Secondary | ICD-10-CM | POA: Diagnosis not present

## 2018-10-14 DIAGNOSIS — G9340 Encephalopathy, unspecified: Secondary | ICD-10-CM | POA: Diagnosis not present

## 2018-10-14 DIAGNOSIS — D649 Anemia, unspecified: Secondary | ICD-10-CM | POA: Diagnosis not present

## 2018-10-14 DIAGNOSIS — R262 Difficulty in walking, not elsewhere classified: Secondary | ICD-10-CM | POA: Diagnosis not present

## 2018-10-14 DIAGNOSIS — L98429 Non-pressure chronic ulcer of back with unspecified severity: Secondary | ICD-10-CM | POA: Diagnosis not present

## 2018-10-14 DIAGNOSIS — N390 Urinary tract infection, site not specified: Secondary | ICD-10-CM | POA: Diagnosis not present

## 2018-10-14 DIAGNOSIS — K219 Gastro-esophageal reflux disease without esophagitis: Secondary | ICD-10-CM | POA: Diagnosis not present

## 2018-10-14 DIAGNOSIS — G8929 Other chronic pain: Secondary | ICD-10-CM | POA: Diagnosis not present

## 2018-10-14 DIAGNOSIS — S72002D Fracture of unspecified part of neck of left femur, subsequent encounter for closed fracture with routine healing: Secondary | ICD-10-CM | POA: Diagnosis not present

## 2018-10-14 DIAGNOSIS — F0150 Vascular dementia without behavioral disturbance: Secondary | ICD-10-CM | POA: Diagnosis not present

## 2018-10-14 MED ORDER — OXYCODONE-ACETAMINOPHEN 5-325 MG PO TABS: 1.0000 | ORAL_TABLET | Freq: Four times a day (QID) | ORAL | 0 refills | Status: DC | PRN

## 2018-10-14 MED ORDER — FERROUS SULFATE 300 (60 FE) MG/5ML PO SYRP: 300.0000 mg | ORAL_SOLUTION | Freq: Every day | ORAL | 3 refills | Status: DC

## 2018-10-14 MED ORDER — FOLIC ACID 1 MG PO TABS: 1.0000 mg | ORAL_TABLET | Freq: Every day | ORAL | Status: AC

## 2018-10-14 MED ORDER — THIAMINE HCL 100 MG PO TABS: 100.0000 mg | ORAL_TABLET | Freq: Every day | ORAL | Status: DC

## 2018-10-14 MED ORDER — ALPRAZOLAM 0.5 MG PO TABS: 0.5000 mg | ORAL_TABLET | Freq: Two times a day (BID) | ORAL | 0 refills | Status: DC

## 2018-10-14 MED ORDER — ENSURE ENLIVE PO LIQD: 237.0000 mL | Freq: Three times a day (TID) | ORAL | 12 refills | Status: AC

## 2018-10-14 NOTE — Progress Notes (Signed)
  Date: 10/14/2018  Patient name: Whitney Steele  Medical record number: [id number]  Date of birth: April 17, 1946   I have seen and evaluated this patient and I have discussed the plan of care with the house staff. Please see their note for complete details. I concur with their findings with the following additions/corrections: [name] was seen this morning on team rounds.  She is lying in bed and denied any pain.  She is medically stable to be transferred to a SNF.  Burns Spain, MD 10/14/2018, 12:08 PM

## 2018-10-14 NOTE — TOC Transition Note (Signed)
Transition of Care [facility]) - CM/SW Discharge Note   Patient Details  Name: Whitney Steele MRN: [id number] Date of Birth: March 16, 1946  Transition of Care [facility]) CM/SW Contact:  Mearl Latin, LCSW Phone Number: 10/14/2018, 3:13 PM   Clinical Narrative:    Patient will DC to: [facility] Anticipated DC date: 10/14/2018 Family notified: Daughter and Spouse Transport by: PTAR 3:30pm   Per MD patient ready for DC to [facility]. RN, patient, patient's family, and facility notified of DC. Discharge Summary and FL2 sent to facility. RN to call report prior to discharge [phone]). DC packet on chart. Ambulance transport requested for patient.   CSW will sign off for now as social work intervention is no longer needed. Please consult [place] again if new needs arise.  Cristobal Goldmann, LCSW Clinical Social Worker [phone]    Final next level of care: Skilled Nursing Facility Barriers to Discharge: No Barriers Identified   Patient Goals and CMS Choice Patient states their goals for this hospitalization and ongoing recovery are:: Rehab CMS Medicare.gov Compare Post Acute Care list provided to:: Other (Comment Required)(Spouse) Choice offered to / list presented to : Spouse  Discharge Placement   Existing PASRR number confirmed : 10/12/18          Patient chooses bed at: [facility] Patient to be transferred to facility by: PTAR Name of family member notified: Daughter, [name] Patient and family notified of of transfer: 10/14/18  Discharge Plan and Services In-house Referral: Clinical Social Work Discharge Planning Services: NA Post Acute Care Choice: Skilled Nursing Facility          DME Arranged: N/A DME Agency: NA       HH Arranged: NA HH Agency: NA        Social Determinants of Health (SDOH) Interventions     Readmission Risk Interventions Readmission Risk Prevention Plan 10/12/2018  Transportation Screening Complete  PCP or Specialist  Appt within 5-7 Days Complete  Home Care Screening Complete  Medication Review (RN CM) Complete  Some recent data might be hidden
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
## 2018-10-14 NOTE — TOC Progression Note (Signed)
Transition of Care [facility]) - Progression Note    Patient Details  Name: SHYRL NOVOSEL MRN: [id number] Date of Birth: [birth year]  Transition of Care [facility]) CM/SW Contact  Mearl Latin, LCSW Phone Number: 10/14/2018, 8:55 AM  Clinical Narrative:    Patient's daughter and spouse have selected [facility]. Spouse will complete paperwork there today at 11am.    Expected Discharge Plan: Skilled Nursing Facility Barriers to Discharge: Continued Medical Work up  Expected Discharge Plan and Services Expected Discharge Plan: Skilled Nursing Facility In-house Referral: Clinical Social Work Discharge Planning Services: NA Post Acute Care Choice: Skilled Nursing Facility Living arrangements for the past 2 months: Single Family Home                 DME Arranged: N/A DME Agency: NA       HH Arranged: NA HH Agency: NA         Social Determinants of Health (SDOH) Interventions    Readmission Risk Interventions Readmission Risk Prevention Plan 10/12/2018  Transportation Screening Complete  PCP or Specialist Appt within 5-7 Days Complete  Home Care Screening Complete  Medication Review (RN CM) Complete  Some recent data might be hidden

## 2018-10-14 NOTE — Progress Notes (Signed)
Aquacel dressing placed and all safety measures in place

## 2018-10-16 ENCOUNTER — Telehealth (INDEPENDENT_AMBULATORY_CARE_PROVIDER_SITE_OTHER): Payer: Self-pay | Admitting: Physician Assistant

## 2018-10-16 DIAGNOSIS — D649 Anemia, unspecified: Secondary | ICD-10-CM | POA: Diagnosis not present

## 2018-10-16 DIAGNOSIS — L98429 Non-pressure chronic ulcer of back with unspecified severity: Secondary | ICD-10-CM | POA: Diagnosis not present

## 2018-10-16 DIAGNOSIS — Z89612 Acquired absence of left leg above knee: Secondary | ICD-10-CM | POA: Diagnosis not present

## 2018-10-16 DIAGNOSIS — Z4781 Encounter for orthopedic aftercare following surgical amputation: Secondary | ICD-10-CM | POA: Diagnosis not present

## 2018-10-16 NOTE — Telephone Encounter (Signed)
Called patient's daughter , Kimber Relic who is her POA and let her know that all cultures obtained during her hospitalization were negative. Also requested she make a follow up appointment for the patient to be seen in the office. The patient is currently in SNF-Rehab.   [employer] [phone]

## 2018-10-19 DIAGNOSIS — G8929 Other chronic pain: Secondary | ICD-10-CM | POA: Diagnosis not present

## 2018-10-19 DIAGNOSIS — L98429 Non-pressure chronic ulcer of back with unspecified severity: Secondary | ICD-10-CM | POA: Diagnosis not present

## 2018-10-19 DIAGNOSIS — Z89612 Acquired absence of left leg above knee: Secondary | ICD-10-CM | POA: Diagnosis not present

## 2018-10-21 ENCOUNTER — Encounter: Payer: Self-pay | Admitting: Family

## 2018-10-21 ENCOUNTER — Other Ambulatory Visit: Payer: Self-pay

## 2018-10-21 ENCOUNTER — Ambulatory Visit (INDEPENDENT_AMBULATORY_CARE_PROVIDER_SITE_OTHER): Payer: Medicare Other | Admitting: Family

## 2018-10-21 VITALS — Ht 64.0 in | Wt 111.0 lb

## 2018-10-21 DIAGNOSIS — Z7409 Other reduced mobility: Secondary | ICD-10-CM | POA: Diagnosis not present

## 2018-10-21 DIAGNOSIS — G9340 Encephalopathy, unspecified: Secondary | ICD-10-CM | POA: Diagnosis not present

## 2018-10-21 DIAGNOSIS — K219 Gastro-esophageal reflux disease without esophagitis: Secondary | ICD-10-CM | POA: Diagnosis not present

## 2018-10-21 DIAGNOSIS — S78112A Complete traumatic amputation at level between left hip and knee, initial encounter: Secondary | ICD-10-CM

## 2018-10-21 DIAGNOSIS — M6281 Muscle weakness (generalized): Secondary | ICD-10-CM | POA: Diagnosis not present

## 2018-10-21 DIAGNOSIS — L89154 Pressure ulcer of sacral region, stage 4: Secondary | ICD-10-CM | POA: Diagnosis not present

## 2018-10-21 DIAGNOSIS — I10 Essential (primary) hypertension: Secondary | ICD-10-CM | POA: Diagnosis not present

## 2018-10-21 DIAGNOSIS — Z89612 Acquired absence of left leg above knee: Secondary | ICD-10-CM | POA: Diagnosis not present

## 2018-10-21 MED ORDER — GABAPENTIN 100 MG PO CAPS: 100.0000 mg | ORAL_CAPSULE | Freq: Two times a day (BID) | ORAL | 2 refills | Status: AC

## 2018-10-21 NOTE — Progress Notes (Signed)
Post-Op Visit Note   Patient: Whitney Steele           Date of Birth: 1946/04/16           MRN: [id number] Visit Date: 10/21/2018 PCP: Simone Curia, MD  Chief Complaint:  Chief Complaint  Patient presents with  . Left Leg - Routine Post Op    10/09/2018 left AKA     HPI:  HPI The patient is a 73 year old woman who presents today status post left above-the-knee amputation on May 1.  Sutures and staples in place.  Wound VAC removed.  Overall she feels she is doing well.  She did have below the knee prosthetic she is unsure what company fabricated this.  Today she is going to be checking with her family to discuss whether she would like an above-the-knee prosthesis.  Ortho Exam Incision clean dry and intact.  No erythema no drainage no sign of infection  Visit Diagnoses:  1. Unilateral AKA, left (HCC)     Plan: Continue daily Dial soap cleansing.  Dry dressings.  Follow-up in the office in 2 weeks for suture removal.  Follow-Up Instructions: Return in about 2 weeks (around 11/04/2018).   Imaging: No results found.  Orders:  No orders of the defined types were placed in this encounter.  Meds ordered this encounter  Medications  . gabapentin (NEURONTIN) 100 MG capsule    Sig: Take 1 capsule (100 mg total) by mouth 2 (two) times daily.    Dispense:  60 capsule    Refill:  2     PMFS History: Patient Active Problem List   Diagnosis Date Noted  . Confusion   . Unilateral AKA, left (HCC)   . Sacral ulcer (HCC) 10/06/2018  . 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 Cellulitis of left lower extremity   . Altered mental status 10/05/2018   Past Medical History:  Diagnosis Date  . Abnormal AST and ALT   . Allergic rhinitis   . Anemia   . Benign essential hypertension   . COPD (chronic obstructive pulmonary disease) (HCC)   . Depression with anxiety   . Dysphagia   . GERD (gastroesophageal reflux disease)   . Hypercholesterolemia   .  Hypertension   . Hypothyroidism   . Insomnia   . Osteoporosis   . Rheumatoid arthritis (HCC)     History reviewed. No pertinent family history.  Past Surgical History:  Procedure Laterality Date  . ABDOMINAL HYSTERECTOMY    . AMPUTATION Left 10/09/2018   Procedure: LEFT ABOVE KNEE AMPUTATION;  Surgeon: Nadara Mustard, MD;  Location: [facility] OR;  Service: Orthopedics;  Laterality: Left;  . 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 CARPAL TUNNEL RELEASE    . ESOPHAGOGASTRODUODENOSCOPY  09/12/2016   Distal esophageal Schatzkis ring. Moderate hh. Highly tourturous esophagus. Mild esophageal diverticulum. Superficial gastric ulcer. Esophagus Bx: benign squamoglandular mucosa with chronic inflammation and squamous hyperplasia suggestive of gastroesophageal reflux disease. Stomach Bx: Ulceration and reactive gastropthy.  . INCISIONAL HERNIA REPAIR    . LAPAROSCOPIC CHOLECYSTECTOMY    . OVARY SURGERY     removal of ovarian masses   Social History   Occupational History  . Not on file  Tobacco Use  . Smoking status: Never Smoker  . Smokeless tobacco: Never Used  Substance and Sexual Activity  . Alcohol use: Not Currently  . Drug use: Never  . Sexual activity: Not on file

## 2018-10-23 DIAGNOSIS — L98429 Non-pressure chronic ulcer of back with unspecified severity: Secondary | ICD-10-CM | POA: Diagnosis not present

## 2018-10-23 DIAGNOSIS — S72002G Fracture of unspecified part of neck of left femur, subsequent encounter for closed fracture with delayed healing: Secondary | ICD-10-CM | POA: Diagnosis not present

## 2018-10-23 DIAGNOSIS — Z89612 Acquired absence of left leg above knee: Secondary | ICD-10-CM | POA: Diagnosis not present

## 2018-10-23 DIAGNOSIS — Z4781 Encounter for orthopedic aftercare following surgical amputation: Secondary | ICD-10-CM | POA: Diagnosis not present

## 2018-10-28 DIAGNOSIS — M6281 Muscle weakness (generalized): Secondary | ICD-10-CM | POA: Diagnosis not present

## 2018-10-28 DIAGNOSIS — L89154 Pressure ulcer of sacral region, stage 4: Secondary | ICD-10-CM | POA: Diagnosis not present

## 2018-10-28 DIAGNOSIS — Z7409 Other reduced mobility: Secondary | ICD-10-CM | POA: Diagnosis not present

## 2018-10-29 ENCOUNTER — Telehealth: Payer: Self-pay | Admitting: Physician Assistant

## 2018-10-30 DIAGNOSIS — G8929 Other chronic pain: Secondary | ICD-10-CM | POA: Diagnosis not present

## 2018-10-30 DIAGNOSIS — L98429 Non-pressure chronic ulcer of back with unspecified severity: Secondary | ICD-10-CM | POA: Diagnosis not present

## 2018-10-30 DIAGNOSIS — Z89612 Acquired absence of left leg above knee: Secondary | ICD-10-CM | POA: Diagnosis not present

## 2018-10-30 NOTE — Telephone Encounter (Signed)
Spoke with the patient's daughter, Kimber Relic concerning the patient's office visit last week and updated her on her orthopedic problems.  The daughter is concerned about her mom's blood counts and some mental confusion and I encouraged the daughter to discuss with the physician caring for the patient at the nursing facility.   [employer] [phone]

## 2018-11-02 DIAGNOSIS — J449 Chronic obstructive pulmonary disease, unspecified: Secondary | ICD-10-CM | POA: Diagnosis not present

## 2018-11-04 ENCOUNTER — Other Ambulatory Visit: Payer: Self-pay

## 2018-11-04 ENCOUNTER — Ambulatory Visit (INDEPENDENT_AMBULATORY_CARE_PROVIDER_SITE_OTHER): Payer: Medicare Other | Admitting: Family

## 2018-11-04 ENCOUNTER — Encounter: Payer: Self-pay | Admitting: Family

## 2018-11-04 VITALS — Ht 64.0 in | Wt 111.0 lb

## 2018-11-04 DIAGNOSIS — Z89612 Acquired absence of left leg above knee: Secondary | ICD-10-CM

## 2018-11-04 DIAGNOSIS — M6281 Muscle weakness (generalized): Secondary | ICD-10-CM | POA: Diagnosis not present

## 2018-11-04 DIAGNOSIS — S78112A Complete traumatic amputation at level between left hip and knee, initial encounter: Secondary | ICD-10-CM

## 2018-11-04 DIAGNOSIS — L89154 Pressure ulcer of sacral region, stage 4: Secondary | ICD-10-CM | POA: Diagnosis not present

## 2018-11-04 DIAGNOSIS — Z7409 Other reduced mobility: Secondary | ICD-10-CM | POA: Diagnosis not present

## 2018-11-04 MED ORDER — TIZANIDINE HCL 2 MG PO TABS: 2.0000 mg | ORAL_TABLET | Freq: Four times a day (QID) | ORAL | 0 refills | Status: DC | PRN

## 2018-11-04 NOTE — Progress Notes (Signed)
Post-Op Visit Note   Patient: Whitney Steele           Date of Birth: Sep 08, 1945           MRN: [id number] Visit Date: 11/04/2018 PCP: Simone Curia, MD  Chief Complaint:  Chief Complaint  Patient presents with  . Left Leg - Routine Post Op    10/09/2018 left AKA    HPI:  HPI Patient is a 72 year old woman who presents a follow-up for left above-the-knee amputation on May 1.  Incision is healing well overall she does complain of some muscle spasm pain this is quite persistent would like to help with this pain.  She states she will continue trying to wean off of her oxycodone which she is currently taking every 8 hours. Ortho Exam Incision healing well sutures and staples in place there is no drainage no odor no surrounding erythema no sign of infection mild tenderness along the incision.  Visit Diagnoses: No diagnosis found.  Plan: Sutures and staples removed today she will continue with daily Dial soap cleansing and dry dressings until healed.  Offered a prescription for prosthetic patient would like some time to think this over and discuss with her family.  She will follow-up in the office in 2 months.  Follow-up sooner should she have any concerns may call for prescription for prosthesis if she would like.  Follow-Up Instructions: No follow-ups on file.   Imaging: No results found.  Orders:  No orders of the defined types were placed in this encounter.  Meds ordered this encounter  Medications  . tiZANidine (ZANAFLEX) 2 MG tablet    Sig: Take 1 tablet (2 mg total) by mouth every 6 (six) hours as needed for muscle spasms.    Dispense:  30 tablet    Refill:  0     PMFS History: Patient Active Problem List   Diagnosis Date Noted  . Confusion   . Unilateral AKA, left (HCC)   . Sacral ulcer (HCC) 10/06/2018  . 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 Cellulitis of left lower extremity   . Altered mental status 10/05/2018   Past Medical  History:  Diagnosis Date  . Abnormal AST and ALT   . Allergic rhinitis   . Anemia   . Benign essential hypertension   . COPD (chronic obstructive pulmonary disease) (HCC)   . Depression with anxiety   . Dysphagia   . GERD (gastroesophageal reflux disease)   . Hypercholesterolemia   . Hypertension   . Hypothyroidism   . Insomnia   . Osteoporosis   . Rheumatoid arthritis (HCC)     No family history on file.  Past Surgical History:  Procedure Laterality Date  . ABDOMINAL HYSTERECTOMY    . AMPUTATION Left 10/09/2018   Procedure: LEFT ABOVE KNEE AMPUTATION;  Surgeon: Nadara Mustard, MD;  Location: [facility] OR;  Service: Orthopedics;  Laterality: Left;  . 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 CARPAL TUNNEL RELEASE    . ESOPHAGOGASTRODUODENOSCOPY  09/12/2016   Distal esophageal Schatzkis ring. Moderate hh. Highly tourturous esophagus. Mild esophageal diverticulum. Superficial gastric ulcer. Esophagus Bx: benign squamoglandular mucosa with chronic inflammation and squamous hyperplasia suggestive of gastroesophageal reflux disease. Stomach Bx: Ulceration and reactive gastropthy.  . INCISIONAL HERNIA REPAIR    . LAPAROSCOPIC CHOLECYSTECTOMY    . OVARY SURGERY     removal of ovarian masses   Social History   Occupational History  .  Not on file  Tobacco Use  . Smoking status: Never Smoker  . Smokeless tobacco: Never Used  Substance and Sexual Activity  . Alcohol use: Not Currently  . Drug use: Never  . Sexual activity: Not on file

## 2018-11-06 DIAGNOSIS — Z4781 Encounter for orthopedic aftercare following surgical amputation: Secondary | ICD-10-CM | POA: Diagnosis not present

## 2018-11-06 DIAGNOSIS — J449 Chronic obstructive pulmonary disease, unspecified: Secondary | ICD-10-CM | POA: Diagnosis not present

## 2018-11-08 ENCOUNTER — Telehealth: Payer: Self-pay

## 2018-11-08 NOTE — Telephone Encounter (Signed)
Unable to contact patient. No vm option. Unable to notify pt of potential covid exposure at [facility]. UTA s/sx for covid following covid exposure.

## 2018-11-11 DIAGNOSIS — Z7409 Other reduced mobility: Secondary | ICD-10-CM | POA: Diagnosis not present

## 2018-11-11 DIAGNOSIS — L89154 Pressure ulcer of sacral region, stage 4: Secondary | ICD-10-CM | POA: Diagnosis not present

## 2018-11-11 DIAGNOSIS — M6281 Muscle weakness (generalized): Secondary | ICD-10-CM | POA: Diagnosis not present

## 2018-11-13 DIAGNOSIS — Z89612 Acquired absence of left leg above knee: Secondary | ICD-10-CM | POA: Diagnosis not present

## 2018-11-13 DIAGNOSIS — L98429 Non-pressure chronic ulcer of back with unspecified severity: Secondary | ICD-10-CM | POA: Diagnosis not present

## 2018-11-13 DIAGNOSIS — G8929 Other chronic pain: Secondary | ICD-10-CM | POA: Diagnosis not present

## 2018-11-16 DIAGNOSIS — D649 Anemia, unspecified: Secondary | ICD-10-CM | POA: Diagnosis not present

## 2018-11-16 DIAGNOSIS — L98429 Non-pressure chronic ulcer of back with unspecified severity: Secondary | ICD-10-CM | POA: Diagnosis not present

## 2018-11-18 DIAGNOSIS — Z7409 Other reduced mobility: Secondary | ICD-10-CM | POA: Diagnosis not present

## 2018-11-18 DIAGNOSIS — M6281 Muscle weakness (generalized): Secondary | ICD-10-CM | POA: Diagnosis not present

## 2018-11-18 DIAGNOSIS — L89154 Pressure ulcer of sacral region, stage 4: Secondary | ICD-10-CM | POA: Diagnosis not present

## 2018-11-20 DIAGNOSIS — N390 Urinary tract infection, site not specified: Secondary | ICD-10-CM | POA: Diagnosis not present

## 2018-11-20 DIAGNOSIS — Z22322 Carrier or suspected carrier of Methicillin resistant Staphylococcus aureus: Secondary | ICD-10-CM | POA: Diagnosis not present

## 2018-11-23 DIAGNOSIS — G8929 Other chronic pain: Secondary | ICD-10-CM | POA: Diagnosis not present

## 2018-11-23 DIAGNOSIS — L98429 Non-pressure chronic ulcer of back with unspecified severity: Secondary | ICD-10-CM | POA: Diagnosis not present

## 2018-11-23 DIAGNOSIS — Z89612 Acquired absence of left leg above knee: Secondary | ICD-10-CM | POA: Diagnosis not present

## 2018-11-25 DIAGNOSIS — Z7409 Other reduced mobility: Secondary | ICD-10-CM | POA: Diagnosis not present

## 2018-11-25 DIAGNOSIS — L89150 Pressure ulcer of sacral region, unstageable: Secondary | ICD-10-CM | POA: Diagnosis not present

## 2018-11-25 DIAGNOSIS — M6281 Muscle weakness (generalized): Secondary | ICD-10-CM | POA: Diagnosis not present

## 2018-11-27 DIAGNOSIS — Z4781 Encounter for orthopedic aftercare following surgical amputation: Secondary | ICD-10-CM | POA: Diagnosis not present

## 2018-11-27 DIAGNOSIS — L98429 Non-pressure chronic ulcer of back with unspecified severity: Secondary | ICD-10-CM | POA: Diagnosis not present

## 2018-11-27 DIAGNOSIS — Z89612 Acquired absence of left leg above knee: Secondary | ICD-10-CM | POA: Diagnosis not present

## 2018-11-27 DIAGNOSIS — G8929 Other chronic pain: Secondary | ICD-10-CM | POA: Diagnosis not present

## 2018-11-28 DIAGNOSIS — E039 Hypothyroidism, unspecified: Secondary | ICD-10-CM | POA: Diagnosis not present

## 2018-12-02 DIAGNOSIS — Z4781 Encounter for orthopedic aftercare following surgical amputation: Secondary | ICD-10-CM | POA: Diagnosis not present

## 2018-12-02 DIAGNOSIS — M6281 Muscle weakness (generalized): Secondary | ICD-10-CM | POA: Diagnosis not present

## 2018-12-02 DIAGNOSIS — N390 Urinary tract infection, site not specified: Secondary | ICD-10-CM | POA: Diagnosis not present

## 2018-12-02 DIAGNOSIS — Z7409 Other reduced mobility: Secondary | ICD-10-CM | POA: Diagnosis not present

## 2018-12-02 DIAGNOSIS — R1314 Dysphagia, pharyngoesophageal phase: Secondary | ICD-10-CM | POA: Diagnosis not present

## 2018-12-02 DIAGNOSIS — L89150 Pressure ulcer of sacral region, unstageable: Secondary | ICD-10-CM | POA: Diagnosis not present

## 2018-12-03 DIAGNOSIS — Z4781 Encounter for orthopedic aftercare following surgical amputation: Secondary | ICD-10-CM | POA: Diagnosis not present

## 2018-12-03 DIAGNOSIS — M6281 Muscle weakness (generalized): Secondary | ICD-10-CM | POA: Diagnosis not present

## 2018-12-03 DIAGNOSIS — R1314 Dysphagia, pharyngoesophageal phase: Secondary | ICD-10-CM | POA: Diagnosis not present

## 2018-12-03 DIAGNOSIS — N390 Urinary tract infection, site not specified: Secondary | ICD-10-CM | POA: Diagnosis not present

## 2018-12-04 DIAGNOSIS — M6281 Muscle weakness (generalized): Secondary | ICD-10-CM | POA: Diagnosis not present

## 2018-12-04 DIAGNOSIS — Z89612 Acquired absence of left leg above knee: Secondary | ICD-10-CM | POA: Diagnosis not present

## 2018-12-04 DIAGNOSIS — N390 Urinary tract infection, site not specified: Secondary | ICD-10-CM | POA: Diagnosis not present

## 2018-12-04 DIAGNOSIS — Z4781 Encounter for orthopedic aftercare following surgical amputation: Secondary | ICD-10-CM | POA: Diagnosis not present

## 2018-12-04 DIAGNOSIS — D649 Anemia, unspecified: Secondary | ICD-10-CM | POA: Diagnosis not present

## 2018-12-04 DIAGNOSIS — R1314 Dysphagia, pharyngoesophageal phase: Secondary | ICD-10-CM | POA: Diagnosis not present

## 2018-12-04 DIAGNOSIS — G9340 Encephalopathy, unspecified: Secondary | ICD-10-CM | POA: Diagnosis not present

## 2018-12-04 DIAGNOSIS — L98429 Non-pressure chronic ulcer of back with unspecified severity: Secondary | ICD-10-CM | POA: Diagnosis not present

## 2018-12-07 DIAGNOSIS — N390 Urinary tract infection, site not specified: Secondary | ICD-10-CM | POA: Diagnosis not present

## 2018-12-07 DIAGNOSIS — R1314 Dysphagia, pharyngoesophageal phase: Secondary | ICD-10-CM | POA: Diagnosis not present

## 2018-12-07 DIAGNOSIS — Z4781 Encounter for orthopedic aftercare following surgical amputation: Secondary | ICD-10-CM | POA: Diagnosis not present

## 2018-12-07 DIAGNOSIS — J449 Chronic obstructive pulmonary disease, unspecified: Secondary | ICD-10-CM | POA: Diagnosis not present

## 2018-12-07 DIAGNOSIS — M6281 Muscle weakness (generalized): Secondary | ICD-10-CM | POA: Diagnosis not present

## 2018-12-08 DIAGNOSIS — M6281 Muscle weakness (generalized): Secondary | ICD-10-CM | POA: Diagnosis not present

## 2018-12-08 DIAGNOSIS — N390 Urinary tract infection, site not specified: Secondary | ICD-10-CM | POA: Diagnosis not present

## 2018-12-08 DIAGNOSIS — R1314 Dysphagia, pharyngoesophageal phase: Secondary | ICD-10-CM | POA: Diagnosis not present

## 2018-12-08 DIAGNOSIS — Z4781 Encounter for orthopedic aftercare following surgical amputation: Secondary | ICD-10-CM | POA: Diagnosis not present

## 2018-12-09 DIAGNOSIS — L89150 Pressure ulcer of sacral region, unstageable: Secondary | ICD-10-CM | POA: Diagnosis not present

## 2018-12-09 DIAGNOSIS — R1314 Dysphagia, pharyngoesophageal phase: Secondary | ICD-10-CM | POA: Diagnosis not present

## 2018-12-09 DIAGNOSIS — Z4781 Encounter for orthopedic aftercare following surgical amputation: Secondary | ICD-10-CM | POA: Diagnosis not present

## 2018-12-09 DIAGNOSIS — M6281 Muscle weakness (generalized): Secondary | ICD-10-CM | POA: Diagnosis not present

## 2018-12-09 DIAGNOSIS — N390 Urinary tract infection, site not specified: Secondary | ICD-10-CM | POA: Diagnosis not present

## 2018-12-09 DIAGNOSIS — M6250 Muscle wasting and atrophy, not elsewhere classified, unspecified site: Secondary | ICD-10-CM | POA: Diagnosis not present

## 2018-12-09 DIAGNOSIS — Z89612 Acquired absence of left leg above knee: Secondary | ICD-10-CM | POA: Diagnosis not present

## 2018-12-09 DIAGNOSIS — Z7409 Other reduced mobility: Secondary | ICD-10-CM | POA: Diagnosis not present

## 2018-12-09 DIAGNOSIS — E785 Hyperlipidemia, unspecified: Secondary | ICD-10-CM | POA: Diagnosis not present

## 2018-12-09 DIAGNOSIS — L89154 Pressure ulcer of sacral region, stage 4: Secondary | ICD-10-CM | POA: Diagnosis not present

## 2018-12-09 DIAGNOSIS — I2510 Atherosclerotic heart disease of native coronary artery without angina pectoris: Secondary | ICD-10-CM | POA: Diagnosis not present

## 2018-12-10 DIAGNOSIS — D649 Anemia, unspecified: Secondary | ICD-10-CM | POA: Diagnosis not present

## 2018-12-10 DIAGNOSIS — Z89612 Acquired absence of left leg above knee: Secondary | ICD-10-CM | POA: Diagnosis not present

## 2018-12-10 DIAGNOSIS — Z4781 Encounter for orthopedic aftercare following surgical amputation: Secondary | ICD-10-CM | POA: Diagnosis not present

## 2018-12-10 DIAGNOSIS — T8130XD Disruption of wound, unspecified, subsequent encounter: Secondary | ICD-10-CM | POA: Diagnosis not present

## 2018-12-10 DIAGNOSIS — M2550 Pain in unspecified joint: Secondary | ICD-10-CM | POA: Diagnosis not present

## 2018-12-10 DIAGNOSIS — M6250 Muscle wasting and atrophy, not elsewhere classified, unspecified site: Secondary | ICD-10-CM | POA: Diagnosis not present

## 2018-12-10 DIAGNOSIS — M6281 Muscle weakness (generalized): Secondary | ICD-10-CM | POA: Diagnosis not present

## 2018-12-10 DIAGNOSIS — R1314 Dysphagia, pharyngoesophageal phase: Secondary | ICD-10-CM | POA: Diagnosis not present

## 2018-12-10 DIAGNOSIS — L89154 Pressure ulcer of sacral region, stage 4: Secondary | ICD-10-CM | POA: Diagnosis not present

## 2018-12-10 DIAGNOSIS — E785 Hyperlipidemia, unspecified: Secondary | ICD-10-CM | POA: Diagnosis not present

## 2018-12-10 DIAGNOSIS — N390 Urinary tract infection, site not specified: Secondary | ICD-10-CM | POA: Diagnosis not present

## 2018-12-11 DIAGNOSIS — R1314 Dysphagia, pharyngoesophageal phase: Secondary | ICD-10-CM | POA: Diagnosis not present

## 2018-12-11 DIAGNOSIS — L89154 Pressure ulcer of sacral region, stage 4: Secondary | ICD-10-CM | POA: Diagnosis not present

## 2018-12-11 DIAGNOSIS — Z4781 Encounter for orthopedic aftercare following surgical amputation: Secondary | ICD-10-CM | POA: Diagnosis not present

## 2018-12-11 DIAGNOSIS — M6281 Muscle weakness (generalized): Secondary | ICD-10-CM | POA: Diagnosis not present

## 2018-12-11 DIAGNOSIS — L98429 Non-pressure chronic ulcer of back with unspecified severity: Secondary | ICD-10-CM | POA: Diagnosis not present

## 2018-12-11 DIAGNOSIS — Z89612 Acquired absence of left leg above knee: Secondary | ICD-10-CM | POA: Diagnosis not present

## 2018-12-11 DIAGNOSIS — N390 Urinary tract infection, site not specified: Secondary | ICD-10-CM | POA: Diagnosis not present

## 2018-12-11 DIAGNOSIS — G8929 Other chronic pain: Secondary | ICD-10-CM | POA: Diagnosis not present

## 2018-12-11 DIAGNOSIS — M6250 Muscle wasting and atrophy, not elsewhere classified, unspecified site: Secondary | ICD-10-CM | POA: Diagnosis not present

## 2018-12-14 DIAGNOSIS — Z4781 Encounter for orthopedic aftercare following surgical amputation: Secondary | ICD-10-CM | POA: Diagnosis not present

## 2018-12-14 DIAGNOSIS — M6250 Muscle wasting and atrophy, not elsewhere classified, unspecified site: Secondary | ICD-10-CM | POA: Diagnosis not present

## 2018-12-14 DIAGNOSIS — N390 Urinary tract infection, site not specified: Secondary | ICD-10-CM | POA: Diagnosis not present

## 2018-12-14 DIAGNOSIS — R1314 Dysphagia, pharyngoesophageal phase: Secondary | ICD-10-CM | POA: Diagnosis not present

## 2018-12-14 DIAGNOSIS — M6281 Muscle weakness (generalized): Secondary | ICD-10-CM | POA: Diagnosis not present

## 2018-12-14 DIAGNOSIS — Z89612 Acquired absence of left leg above knee: Secondary | ICD-10-CM | POA: Diagnosis not present

## 2018-12-14 DIAGNOSIS — L89154 Pressure ulcer of sacral region, stage 4: Secondary | ICD-10-CM | POA: Diagnosis not present

## 2018-12-15 DIAGNOSIS — Z89612 Acquired absence of left leg above knee: Secondary | ICD-10-CM | POA: Diagnosis not present

## 2018-12-15 DIAGNOSIS — M6281 Muscle weakness (generalized): Secondary | ICD-10-CM | POA: Diagnosis not present

## 2018-12-15 DIAGNOSIS — L89154 Pressure ulcer of sacral region, stage 4: Secondary | ICD-10-CM | POA: Diagnosis not present

## 2018-12-15 DIAGNOSIS — N390 Urinary tract infection, site not specified: Secondary | ICD-10-CM | POA: Diagnosis not present

## 2018-12-15 DIAGNOSIS — M6250 Muscle wasting and atrophy, not elsewhere classified, unspecified site: Secondary | ICD-10-CM | POA: Diagnosis not present

## 2018-12-15 DIAGNOSIS — R1314 Dysphagia, pharyngoesophageal phase: Secondary | ICD-10-CM | POA: Diagnosis not present

## 2018-12-15 DIAGNOSIS — Z4781 Encounter for orthopedic aftercare following surgical amputation: Secondary | ICD-10-CM | POA: Diagnosis not present

## 2018-12-16 DIAGNOSIS — Z4781 Encounter for orthopedic aftercare following surgical amputation: Secondary | ICD-10-CM | POA: Diagnosis not present

## 2018-12-16 DIAGNOSIS — L89150 Pressure ulcer of sacral region, unstageable: Secondary | ICD-10-CM | POA: Diagnosis not present

## 2018-12-16 DIAGNOSIS — Z7409 Other reduced mobility: Secondary | ICD-10-CM | POA: Diagnosis not present

## 2018-12-16 DIAGNOSIS — M6250 Muscle wasting and atrophy, not elsewhere classified, unspecified site: Secondary | ICD-10-CM | POA: Diagnosis not present

## 2018-12-16 DIAGNOSIS — M6281 Muscle weakness (generalized): Secondary | ICD-10-CM | POA: Diagnosis not present

## 2018-12-16 DIAGNOSIS — N390 Urinary tract infection, site not specified: Secondary | ICD-10-CM | POA: Diagnosis not present

## 2018-12-16 DIAGNOSIS — R1314 Dysphagia, pharyngoesophageal phase: Secondary | ICD-10-CM | POA: Diagnosis not present

## 2018-12-16 DIAGNOSIS — L89154 Pressure ulcer of sacral region, stage 4: Secondary | ICD-10-CM | POA: Diagnosis not present

## 2018-12-16 DIAGNOSIS — Z89612 Acquired absence of left leg above knee: Secondary | ICD-10-CM | POA: Diagnosis not present

## 2018-12-17 DIAGNOSIS — M6281 Muscle weakness (generalized): Secondary | ICD-10-CM | POA: Diagnosis not present

## 2018-12-17 DIAGNOSIS — L89154 Pressure ulcer of sacral region, stage 4: Secondary | ICD-10-CM | POA: Diagnosis not present

## 2018-12-17 DIAGNOSIS — N390 Urinary tract infection, site not specified: Secondary | ICD-10-CM | POA: Diagnosis not present

## 2018-12-17 DIAGNOSIS — Z89612 Acquired absence of left leg above knee: Secondary | ICD-10-CM | POA: Diagnosis not present

## 2018-12-17 DIAGNOSIS — R1314 Dysphagia, pharyngoesophageal phase: Secondary | ICD-10-CM | POA: Diagnosis not present

## 2018-12-17 DIAGNOSIS — Z4781 Encounter for orthopedic aftercare following surgical amputation: Secondary | ICD-10-CM | POA: Diagnosis not present

## 2018-12-17 DIAGNOSIS — M6250 Muscle wasting and atrophy, not elsewhere classified, unspecified site: Secondary | ICD-10-CM | POA: Diagnosis not present

## 2018-12-18 ENCOUNTER — Other Ambulatory Visit: Payer: Self-pay

## 2018-12-18 ENCOUNTER — Telehealth: Payer: Medicare Other | Admitting: Gastroenterology

## 2018-12-18 DIAGNOSIS — Z4781 Encounter for orthopedic aftercare following surgical amputation: Secondary | ICD-10-CM | POA: Diagnosis not present

## 2018-12-18 DIAGNOSIS — M6281 Muscle weakness (generalized): Secondary | ICD-10-CM | POA: Diagnosis not present

## 2018-12-18 DIAGNOSIS — Z89612 Acquired absence of left leg above knee: Secondary | ICD-10-CM | POA: Diagnosis not present

## 2018-12-18 DIAGNOSIS — L89154 Pressure ulcer of sacral region, stage 4: Secondary | ICD-10-CM | POA: Diagnosis not present

## 2018-12-18 DIAGNOSIS — M6250 Muscle wasting and atrophy, not elsewhere classified, unspecified site: Secondary | ICD-10-CM | POA: Diagnosis not present

## 2018-12-18 DIAGNOSIS — N390 Urinary tract infection, site not specified: Secondary | ICD-10-CM | POA: Diagnosis not present

## 2018-12-18 DIAGNOSIS — R1314 Dysphagia, pharyngoesophageal phase: Secondary | ICD-10-CM | POA: Diagnosis not present

## 2018-12-19 DIAGNOSIS — M6250 Muscle wasting and atrophy, not elsewhere classified, unspecified site: Secondary | ICD-10-CM | POA: Diagnosis not present

## 2018-12-19 DIAGNOSIS — Z89612 Acquired absence of left leg above knee: Secondary | ICD-10-CM | POA: Diagnosis not present

## 2018-12-19 DIAGNOSIS — R1314 Dysphagia, pharyngoesophageal phase: Secondary | ICD-10-CM | POA: Diagnosis not present

## 2018-12-19 DIAGNOSIS — L89154 Pressure ulcer of sacral region, stage 4: Secondary | ICD-10-CM | POA: Diagnosis not present

## 2018-12-19 DIAGNOSIS — Z4781 Encounter for orthopedic aftercare following surgical amputation: Secondary | ICD-10-CM | POA: Diagnosis not present

## 2018-12-19 DIAGNOSIS — N390 Urinary tract infection, site not specified: Secondary | ICD-10-CM | POA: Diagnosis not present

## 2018-12-19 DIAGNOSIS — M6281 Muscle weakness (generalized): Secondary | ICD-10-CM | POA: Diagnosis not present

## 2018-12-21 DIAGNOSIS — M1990 Unspecified osteoarthritis, unspecified site: Secondary | ICD-10-CM | POA: Diagnosis not present

## 2018-12-21 DIAGNOSIS — M6281 Muscle weakness (generalized): Secondary | ICD-10-CM | POA: Diagnosis not present

## 2018-12-21 DIAGNOSIS — Z4781 Encounter for orthopedic aftercare following surgical amputation: Secondary | ICD-10-CM | POA: Diagnosis not present

## 2018-12-21 DIAGNOSIS — N390 Urinary tract infection, site not specified: Secondary | ICD-10-CM | POA: Diagnosis not present

## 2018-12-21 DIAGNOSIS — R262 Difficulty in walking, not elsewhere classified: Secondary | ICD-10-CM | POA: Diagnosis not present

## 2018-12-21 DIAGNOSIS — Z89612 Acquired absence of left leg above knee: Secondary | ICD-10-CM | POA: Diagnosis not present

## 2018-12-21 DIAGNOSIS — R1314 Dysphagia, pharyngoesophageal phase: Secondary | ICD-10-CM | POA: Diagnosis not present

## 2018-12-21 DIAGNOSIS — L89154 Pressure ulcer of sacral region, stage 4: Secondary | ICD-10-CM | POA: Diagnosis not present

## 2018-12-21 DIAGNOSIS — M069 Rheumatoid arthritis, unspecified: Secondary | ICD-10-CM | POA: Diagnosis not present

## 2018-12-21 DIAGNOSIS — M6250 Muscle wasting and atrophy, not elsewhere classified, unspecified site: Secondary | ICD-10-CM | POA: Diagnosis not present

## 2018-12-21 DIAGNOSIS — S72002D Fracture of unspecified part of neck of left femur, subsequent encounter for closed fracture with routine healing: Secondary | ICD-10-CM | POA: Diagnosis not present

## 2018-12-22 DIAGNOSIS — M6281 Muscle weakness (generalized): Secondary | ICD-10-CM | POA: Diagnosis not present

## 2018-12-22 DIAGNOSIS — Z89612 Acquired absence of left leg above knee: Secondary | ICD-10-CM | POA: Diagnosis not present

## 2018-12-22 DIAGNOSIS — L89150 Pressure ulcer of sacral region, unstageable: Secondary | ICD-10-CM | POA: Diagnosis not present

## 2018-12-22 DIAGNOSIS — N390 Urinary tract infection, site not specified: Secondary | ICD-10-CM | POA: Diagnosis not present

## 2018-12-22 DIAGNOSIS — R1314 Dysphagia, pharyngoesophageal phase: Secondary | ICD-10-CM | POA: Diagnosis not present

## 2018-12-22 DIAGNOSIS — M6250 Muscle wasting and atrophy, not elsewhere classified, unspecified site: Secondary | ICD-10-CM | POA: Diagnosis not present

## 2018-12-22 DIAGNOSIS — Z4781 Encounter for orthopedic aftercare following surgical amputation: Secondary | ICD-10-CM | POA: Diagnosis not present

## 2018-12-22 DIAGNOSIS — L89154 Pressure ulcer of sacral region, stage 4: Secondary | ICD-10-CM | POA: Diagnosis not present

## 2018-12-23 DIAGNOSIS — L89154 Pressure ulcer of sacral region, stage 4: Secondary | ICD-10-CM | POA: Diagnosis not present

## 2018-12-23 DIAGNOSIS — N390 Urinary tract infection, site not specified: Secondary | ICD-10-CM | POA: Diagnosis not present

## 2018-12-23 DIAGNOSIS — Z89612 Acquired absence of left leg above knee: Secondary | ICD-10-CM | POA: Diagnosis not present

## 2018-12-23 DIAGNOSIS — R1314 Dysphagia, pharyngoesophageal phase: Secondary | ICD-10-CM | POA: Diagnosis not present

## 2018-12-23 DIAGNOSIS — M6250 Muscle wasting and atrophy, not elsewhere classified, unspecified site: Secondary | ICD-10-CM | POA: Diagnosis not present

## 2018-12-23 DIAGNOSIS — L89150 Pressure ulcer of sacral region, unstageable: Secondary | ICD-10-CM | POA: Diagnosis not present

## 2018-12-23 DIAGNOSIS — Z7409 Other reduced mobility: Secondary | ICD-10-CM | POA: Diagnosis not present

## 2018-12-23 DIAGNOSIS — Z4781 Encounter for orthopedic aftercare following surgical amputation: Secondary | ICD-10-CM | POA: Diagnosis not present

## 2018-12-23 DIAGNOSIS — M6281 Muscle weakness (generalized): Secondary | ICD-10-CM | POA: Diagnosis not present

## 2018-12-24 DIAGNOSIS — M6250 Muscle wasting and atrophy, not elsewhere classified, unspecified site: Secondary | ICD-10-CM | POA: Diagnosis not present

## 2018-12-24 DIAGNOSIS — R1314 Dysphagia, pharyngoesophageal phase: Secondary | ICD-10-CM | POA: Diagnosis not present

## 2018-12-24 DIAGNOSIS — M6281 Muscle weakness (generalized): Secondary | ICD-10-CM | POA: Diagnosis not present

## 2018-12-24 DIAGNOSIS — N390 Urinary tract infection, site not specified: Secondary | ICD-10-CM | POA: Diagnosis not present

## 2018-12-24 DIAGNOSIS — Z4781 Encounter for orthopedic aftercare following surgical amputation: Secondary | ICD-10-CM | POA: Diagnosis not present

## 2018-12-24 DIAGNOSIS — L89154 Pressure ulcer of sacral region, stage 4: Secondary | ICD-10-CM | POA: Diagnosis not present

## 2018-12-24 DIAGNOSIS — Z89612 Acquired absence of left leg above knee: Secondary | ICD-10-CM | POA: Diagnosis not present

## 2018-12-25 DIAGNOSIS — R1314 Dysphagia, pharyngoesophageal phase: Secondary | ICD-10-CM | POA: Diagnosis not present

## 2018-12-25 DIAGNOSIS — M6250 Muscle wasting and atrophy, not elsewhere classified, unspecified site: Secondary | ICD-10-CM | POA: Diagnosis not present

## 2018-12-25 DIAGNOSIS — L89154 Pressure ulcer of sacral region, stage 4: Secondary | ICD-10-CM | POA: Diagnosis not present

## 2018-12-25 DIAGNOSIS — G8929 Other chronic pain: Secondary | ICD-10-CM | POA: Diagnosis not present

## 2018-12-25 DIAGNOSIS — Z89612 Acquired absence of left leg above knee: Secondary | ICD-10-CM | POA: Diagnosis not present

## 2018-12-25 DIAGNOSIS — Z4781 Encounter for orthopedic aftercare following surgical amputation: Secondary | ICD-10-CM | POA: Diagnosis not present

## 2018-12-25 DIAGNOSIS — N390 Urinary tract infection, site not specified: Secondary | ICD-10-CM | POA: Diagnosis not present

## 2018-12-25 DIAGNOSIS — M6281 Muscle weakness (generalized): Secondary | ICD-10-CM | POA: Diagnosis not present

## 2018-12-25 DIAGNOSIS — L98429 Non-pressure chronic ulcer of back with unspecified severity: Secondary | ICD-10-CM | POA: Diagnosis not present

## 2018-12-28 ENCOUNTER — Telehealth: Payer: Medicare Other | Admitting: Gastroenterology

## 2018-12-28 DIAGNOSIS — Z89612 Acquired absence of left leg above knee: Secondary | ICD-10-CM | POA: Diagnosis not present

## 2018-12-28 DIAGNOSIS — J449 Chronic obstructive pulmonary disease, unspecified: Secondary | ICD-10-CM | POA: Diagnosis not present

## 2018-12-28 DIAGNOSIS — Z4781 Encounter for orthopedic aftercare following surgical amputation: Secondary | ICD-10-CM | POA: Diagnosis not present

## 2018-12-28 DIAGNOSIS — M6281 Muscle weakness (generalized): Secondary | ICD-10-CM | POA: Diagnosis not present

## 2018-12-28 DIAGNOSIS — E039 Hypothyroidism, unspecified: Secondary | ICD-10-CM | POA: Diagnosis not present

## 2018-12-28 DIAGNOSIS — L89154 Pressure ulcer of sacral region, stage 4: Secondary | ICD-10-CM | POA: Diagnosis not present

## 2018-12-28 DIAGNOSIS — M6250 Muscle wasting and atrophy, not elsewhere classified, unspecified site: Secondary | ICD-10-CM | POA: Diagnosis not present

## 2018-12-28 DIAGNOSIS — N390 Urinary tract infection, site not specified: Secondary | ICD-10-CM | POA: Diagnosis not present

## 2018-12-28 DIAGNOSIS — R1314 Dysphagia, pharyngoesophageal phase: Secondary | ICD-10-CM | POA: Diagnosis not present

## 2018-12-28 DIAGNOSIS — L98429 Non-pressure chronic ulcer of back with unspecified severity: Secondary | ICD-10-CM | POA: Diagnosis not present

## 2018-12-29 DIAGNOSIS — Z89612 Acquired absence of left leg above knee: Secondary | ICD-10-CM | POA: Diagnosis not present

## 2018-12-29 DIAGNOSIS — Z4781 Encounter for orthopedic aftercare following surgical amputation: Secondary | ICD-10-CM | POA: Diagnosis not present

## 2018-12-29 DIAGNOSIS — M6281 Muscle weakness (generalized): Secondary | ICD-10-CM | POA: Diagnosis not present

## 2018-12-29 DIAGNOSIS — M6250 Muscle wasting and atrophy, not elsewhere classified, unspecified site: Secondary | ICD-10-CM | POA: Diagnosis not present

## 2018-12-29 DIAGNOSIS — L89154 Pressure ulcer of sacral region, stage 4: Secondary | ICD-10-CM | POA: Diagnosis not present

## 2018-12-29 DIAGNOSIS — R1314 Dysphagia, pharyngoesophageal phase: Secondary | ICD-10-CM | POA: Diagnosis not present

## 2018-12-29 DIAGNOSIS — N390 Urinary tract infection, site not specified: Secondary | ICD-10-CM | POA: Diagnosis not present

## 2018-12-30 DIAGNOSIS — M6281 Muscle weakness (generalized): Secondary | ICD-10-CM | POA: Diagnosis not present

## 2018-12-30 DIAGNOSIS — N390 Urinary tract infection, site not specified: Secondary | ICD-10-CM | POA: Diagnosis not present

## 2018-12-30 DIAGNOSIS — Z4781 Encounter for orthopedic aftercare following surgical amputation: Secondary | ICD-10-CM | POA: Diagnosis not present

## 2018-12-30 DIAGNOSIS — M6250 Muscle wasting and atrophy, not elsewhere classified, unspecified site: Secondary | ICD-10-CM | POA: Diagnosis not present

## 2018-12-30 DIAGNOSIS — L89150 Pressure ulcer of sacral region, unstageable: Secondary | ICD-10-CM | POA: Diagnosis not present

## 2018-12-30 DIAGNOSIS — Z89612 Acquired absence of left leg above knee: Secondary | ICD-10-CM | POA: Diagnosis not present

## 2018-12-30 DIAGNOSIS — Z7409 Other reduced mobility: Secondary | ICD-10-CM | POA: Diagnosis not present

## 2018-12-30 DIAGNOSIS — R1314 Dysphagia, pharyngoesophageal phase: Secondary | ICD-10-CM | POA: Diagnosis not present

## 2018-12-30 DIAGNOSIS — L89154 Pressure ulcer of sacral region, stage 4: Secondary | ICD-10-CM | POA: Diagnosis not present

## 2018-12-31 DIAGNOSIS — M6250 Muscle wasting and atrophy, not elsewhere classified, unspecified site: Secondary | ICD-10-CM | POA: Diagnosis not present

## 2018-12-31 DIAGNOSIS — M6281 Muscle weakness (generalized): Secondary | ICD-10-CM | POA: Diagnosis not present

## 2018-12-31 DIAGNOSIS — Z4781 Encounter for orthopedic aftercare following surgical amputation: Secondary | ICD-10-CM | POA: Diagnosis not present

## 2018-12-31 DIAGNOSIS — R1314 Dysphagia, pharyngoesophageal phase: Secondary | ICD-10-CM | POA: Diagnosis not present

## 2018-12-31 DIAGNOSIS — L89154 Pressure ulcer of sacral region, stage 4: Secondary | ICD-10-CM | POA: Diagnosis not present

## 2018-12-31 DIAGNOSIS — Z89612 Acquired absence of left leg above knee: Secondary | ICD-10-CM | POA: Diagnosis not present

## 2018-12-31 DIAGNOSIS — N390 Urinary tract infection, site not specified: Secondary | ICD-10-CM | POA: Diagnosis not present

## 2019-01-01 DIAGNOSIS — L89154 Pressure ulcer of sacral region, stage 4: Secondary | ICD-10-CM | POA: Diagnosis not present

## 2019-01-01 DIAGNOSIS — M6250 Muscle wasting and atrophy, not elsewhere classified, unspecified site: Secondary | ICD-10-CM | POA: Diagnosis not present

## 2019-01-01 DIAGNOSIS — M6281 Muscle weakness (generalized): Secondary | ICD-10-CM | POA: Diagnosis not present

## 2019-01-01 DIAGNOSIS — R1314 Dysphagia, pharyngoesophageal phase: Secondary | ICD-10-CM | POA: Diagnosis not present

## 2019-01-01 DIAGNOSIS — S72002G Fracture of unspecified part of neck of left femur, subsequent encounter for closed fracture with delayed healing: Secondary | ICD-10-CM | POA: Diagnosis not present

## 2019-01-01 DIAGNOSIS — Z978 Presence of other specified devices: Secondary | ICD-10-CM | POA: Diagnosis not present

## 2019-01-01 DIAGNOSIS — Z89612 Acquired absence of left leg above knee: Secondary | ICD-10-CM | POA: Diagnosis not present

## 2019-01-01 DIAGNOSIS — N390 Urinary tract infection, site not specified: Secondary | ICD-10-CM | POA: Diagnosis not present

## 2019-01-01 DIAGNOSIS — Z4781 Encounter for orthopedic aftercare following surgical amputation: Secondary | ICD-10-CM | POA: Diagnosis not present

## 2019-01-01 DIAGNOSIS — L98429 Non-pressure chronic ulcer of back with unspecified severity: Secondary | ICD-10-CM | POA: Diagnosis not present

## 2019-01-04 ENCOUNTER — Encounter: Payer: Self-pay | Admitting: Gastroenterology

## 2019-01-04 DIAGNOSIS — M6250 Muscle wasting and atrophy, not elsewhere classified, unspecified site: Secondary | ICD-10-CM | POA: Diagnosis not present

## 2019-01-04 DIAGNOSIS — L89154 Pressure ulcer of sacral region, stage 4: Secondary | ICD-10-CM | POA: Diagnosis not present

## 2019-01-04 DIAGNOSIS — M6281 Muscle weakness (generalized): Secondary | ICD-10-CM | POA: Diagnosis not present

## 2019-01-04 DIAGNOSIS — R1314 Dysphagia, pharyngoesophageal phase: Secondary | ICD-10-CM | POA: Diagnosis not present

## 2019-01-04 DIAGNOSIS — Z4781 Encounter for orthopedic aftercare following surgical amputation: Secondary | ICD-10-CM | POA: Diagnosis not present

## 2019-01-04 DIAGNOSIS — N390 Urinary tract infection, site not specified: Secondary | ICD-10-CM | POA: Diagnosis not present

## 2019-01-04 DIAGNOSIS — Z89612 Acquired absence of left leg above knee: Secondary | ICD-10-CM | POA: Diagnosis not present

## 2019-01-05 DIAGNOSIS — Z89612 Acquired absence of left leg above knee: Secondary | ICD-10-CM | POA: Diagnosis not present

## 2019-01-05 DIAGNOSIS — R1314 Dysphagia, pharyngoesophageal phase: Secondary | ICD-10-CM | POA: Diagnosis not present

## 2019-01-05 DIAGNOSIS — M6281 Muscle weakness (generalized): Secondary | ICD-10-CM | POA: Diagnosis not present

## 2019-01-05 DIAGNOSIS — N390 Urinary tract infection, site not specified: Secondary | ICD-10-CM | POA: Diagnosis not present

## 2019-01-05 DIAGNOSIS — Z4781 Encounter for orthopedic aftercare following surgical amputation: Secondary | ICD-10-CM | POA: Diagnosis not present

## 2019-01-05 DIAGNOSIS — L89154 Pressure ulcer of sacral region, stage 4: Secondary | ICD-10-CM | POA: Diagnosis not present

## 2019-01-05 DIAGNOSIS — M6250 Muscle wasting and atrophy, not elsewhere classified, unspecified site: Secondary | ICD-10-CM | POA: Diagnosis not present

## 2019-01-06 ENCOUNTER — Encounter: Payer: Self-pay | Admitting: Family

## 2019-01-06 ENCOUNTER — Ambulatory Visit (INDEPENDENT_AMBULATORY_CARE_PROVIDER_SITE_OTHER): Payer: Medicare Other | Admitting: Family

## 2019-01-06 ENCOUNTER — Telehealth: Payer: Self-pay | Admitting: Family

## 2019-01-06 VITALS — Ht 64.0 in | Wt 111.0 lb

## 2019-01-06 DIAGNOSIS — R1314 Dysphagia, pharyngoesophageal phase: Secondary | ICD-10-CM | POA: Diagnosis not present

## 2019-01-06 DIAGNOSIS — L89150 Pressure ulcer of sacral region, unstageable: Secondary | ICD-10-CM | POA: Diagnosis not present

## 2019-01-06 DIAGNOSIS — Z89612 Acquired absence of left leg above knee: Secondary | ICD-10-CM

## 2019-01-06 DIAGNOSIS — Z4781 Encounter for orthopedic aftercare following surgical amputation: Secondary | ICD-10-CM | POA: Diagnosis not present

## 2019-01-06 DIAGNOSIS — J449 Chronic obstructive pulmonary disease, unspecified: Secondary | ICD-10-CM | POA: Diagnosis not present

## 2019-01-06 DIAGNOSIS — N390 Urinary tract infection, site not specified: Secondary | ICD-10-CM | POA: Diagnosis not present

## 2019-01-06 DIAGNOSIS — L89154 Pressure ulcer of sacral region, stage 4: Secondary | ICD-10-CM | POA: Diagnosis not present

## 2019-01-06 DIAGNOSIS — M6281 Muscle weakness (generalized): Secondary | ICD-10-CM | POA: Diagnosis not present

## 2019-01-06 DIAGNOSIS — Z7409 Other reduced mobility: Secondary | ICD-10-CM | POA: Diagnosis not present

## 2019-01-06 DIAGNOSIS — M6250 Muscle wasting and atrophy, not elsewhere classified, unspecified site: Secondary | ICD-10-CM | POA: Diagnosis not present

## 2019-01-06 DIAGNOSIS — S78112A Complete traumatic amputation at level between left hip and knee, initial encounter: Secondary | ICD-10-CM

## 2019-01-06 NOTE — Telephone Encounter (Signed)
Received call from Yvette Rack with [facility] advising patient's are not leaving the facility and asked if appointment can be by phone or a virtual visit? The number to contact Darden Dates is [phone]

## 2019-01-06 NOTE — Telephone Encounter (Signed)
Can you please review and see if you want to do virtual visit?

## 2019-01-06 NOTE — Progress Notes (Signed)
   Post-Op Visit Note   Patient: Whitney Steele           Date of Birth: 1945-08-11           MRN: [id number] Visit Date: 01/06/2019 PCP: Cher Nakai, MD  Chief Complaint:  Chief Complaint  Patient presents with  . Left Leg - Routine Post Op    10/09/18 left AKA    HPI:  HPI The patient is a 73 year old woman seen status post left above knee amputation on 10/09/18. Complaining of a lump in her thigh, pains from time to time.  Ortho Exam Incision is nearly healed. There is eschar centrally. This is 15 mm  5 mm. Was debrided with gauze. Underlying granulation. Scant blood. No surrounding erythema, no maceration. No odor. No sign of infection.  Visit Diagnoses:  1. Unilateral AKA, left (HCC)     Plan: daily dial soap cleansing. Dry dressings. Wear shrinker around the clock. Follow up in office in 2 months. Sooner for any concerns.  Follow-Up Instructions: Return in about 2 months (around 03/09/2019).   Imaging: No results found.  Orders:  No orders of the defined types were placed in this encounter.  No orders of the defined types were placed in this encounter.    PMFS History: Patient Active Problem List   Diagnosis Date Noted  . Confusion   . Unilateral AKA, left ([place])   . Sacral ulcer ([place]) 10/06/2018  . Cellulitis of left lower extremity   . Altered mental status 10/05/2018   Past Medical History:  Diagnosis Date  . Abnormal AST and ALT   . Allergic rhinitis   . Anemia   . Benign essential hypertension   . 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 COPD (chronic obstructive pulmonary disease) ([place])   . Depression with anxiety   . Dysphagia   . GERD (gastroesophageal reflux disease)   . Hypercholesterolemia   . Hypertension   . Hypothyroidism   . Insomnia   . Osteoporosis   . Rheumatoid arthritis ([place])     History reviewed. No pertinent family history.  Past Surgical History:  Procedure Laterality Date  . ABDOMINAL HYSTERECTOMY     . AMPUTATION Left 10/09/2018   Procedure: LEFT ABOVE KNEE AMPUTATION;  Surgeon: Newt Minion, MD;  Location: [facility];  Service: Orthopedics;  Laterality: Left;  . 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 CARPAL TUNNEL RELEASE    . ESOPHAGOGASTRODUODENOSCOPY  09/12/2016   Distal esophageal Schatzkis ring. Moderate hh. Highly tourturous esophagus. Mild esophageal diverticulum. Superficial gastric ulcer. Esophagus Bx: benign squamoglandular mucosa with chronic inflammation and squamous hyperplasia suggestive of gastroesophageal reflux disease. Stomach Bx: Ulceration and reactive gastropthy.  . ESOPHAGOGASTRODUODENOSCOPY  09/12/2016   Distal esophageal Schatzkis ring status post esophageal dilitation. Moderate hiatal hernia. Highly torturous esophagus suggestive of esophageal dysmotility/presbyesophagus. Mid esophageal diverticulum. Superficial gastric ulcer.   . INCISIONAL HERNIA REPAIR    . LAPAROSCOPIC CHOLECYSTECTOMY    . OVARY SURGERY     removal of ovarian masses   Social History   Occupational History  . Not on file  Tobacco Use  . Smoking status: Never Smoker  . Smokeless tobacco: Never Used  Substance and Sexual Activity  . Alcohol use: Not Currently  . Drug use: Never  . Sexual activity: Not on file

## 2019-01-07 DIAGNOSIS — Z4781 Encounter for orthopedic aftercare following surgical amputation: Secondary | ICD-10-CM | POA: Diagnosis not present

## 2019-01-07 DIAGNOSIS — R1314 Dysphagia, pharyngoesophageal phase: Secondary | ICD-10-CM | POA: Diagnosis not present

## 2019-01-07 DIAGNOSIS — L98429 Non-pressure chronic ulcer of back with unspecified severity: Secondary | ICD-10-CM | POA: Diagnosis not present

## 2019-01-07 DIAGNOSIS — M6281 Muscle weakness (generalized): Secondary | ICD-10-CM | POA: Diagnosis not present

## 2019-01-07 DIAGNOSIS — G8929 Other chronic pain: Secondary | ICD-10-CM | POA: Diagnosis not present

## 2019-01-07 DIAGNOSIS — N390 Urinary tract infection, site not specified: Secondary | ICD-10-CM | POA: Diagnosis not present

## 2019-01-07 DIAGNOSIS — Z89612 Acquired absence of left leg above knee: Secondary | ICD-10-CM | POA: Diagnosis not present

## 2019-01-07 DIAGNOSIS — L89154 Pressure ulcer of sacral region, stage 4: Secondary | ICD-10-CM | POA: Diagnosis not present

## 2019-01-07 DIAGNOSIS — M6250 Muscle wasting and atrophy, not elsewhere classified, unspecified site: Secondary | ICD-10-CM | POA: Diagnosis not present

## 2019-01-08 DIAGNOSIS — M6250 Muscle wasting and atrophy, not elsewhere classified, unspecified site: Secondary | ICD-10-CM | POA: Diagnosis not present

## 2019-01-08 DIAGNOSIS — Z4781 Encounter for orthopedic aftercare following surgical amputation: Secondary | ICD-10-CM | POA: Diagnosis not present

## 2019-01-08 DIAGNOSIS — L89154 Pressure ulcer of sacral region, stage 4: Secondary | ICD-10-CM | POA: Diagnosis not present

## 2019-01-08 DIAGNOSIS — M6281 Muscle weakness (generalized): Secondary | ICD-10-CM | POA: Diagnosis not present

## 2019-01-08 DIAGNOSIS — R1314 Dysphagia, pharyngoesophageal phase: Secondary | ICD-10-CM | POA: Diagnosis not present

## 2019-01-08 DIAGNOSIS — N390 Urinary tract infection, site not specified: Secondary | ICD-10-CM | POA: Diagnosis not present

## 2019-01-08 DIAGNOSIS — Z89612 Acquired absence of left leg above knee: Secondary | ICD-10-CM | POA: Diagnosis not present

## 2019-01-11 DIAGNOSIS — M6281 Muscle weakness (generalized): Secondary | ICD-10-CM | POA: Diagnosis not present

## 2019-01-11 DIAGNOSIS — N390 Urinary tract infection, site not specified: Secondary | ICD-10-CM | POA: Diagnosis not present

## 2019-01-11 DIAGNOSIS — Z4781 Encounter for orthopedic aftercare following surgical amputation: Secondary | ICD-10-CM | POA: Diagnosis not present

## 2019-01-11 DIAGNOSIS — M6250 Muscle wasting and atrophy, not elsewhere classified, unspecified site: Secondary | ICD-10-CM | POA: Diagnosis not present

## 2019-01-11 DIAGNOSIS — R1314 Dysphagia, pharyngoesophageal phase: Secondary | ICD-10-CM | POA: Diagnosis not present

## 2019-01-11 DIAGNOSIS — L89154 Pressure ulcer of sacral region, stage 4: Secondary | ICD-10-CM | POA: Diagnosis not present

## 2019-01-11 DIAGNOSIS — Z89612 Acquired absence of left leg above knee: Secondary | ICD-10-CM | POA: Diagnosis not present

## 2019-01-12 DIAGNOSIS — L89154 Pressure ulcer of sacral region, stage 4: Secondary | ICD-10-CM | POA: Diagnosis not present

## 2019-01-12 DIAGNOSIS — Z89612 Acquired absence of left leg above knee: Secondary | ICD-10-CM | POA: Diagnosis not present

## 2019-01-12 DIAGNOSIS — M6281 Muscle weakness (generalized): Secondary | ICD-10-CM | POA: Diagnosis not present

## 2019-01-12 DIAGNOSIS — Z4781 Encounter for orthopedic aftercare following surgical amputation: Secondary | ICD-10-CM | POA: Diagnosis not present

## 2019-01-12 DIAGNOSIS — N390 Urinary tract infection, site not specified: Secondary | ICD-10-CM | POA: Diagnosis not present

## 2019-01-12 DIAGNOSIS — M6250 Muscle wasting and atrophy, not elsewhere classified, unspecified site: Secondary | ICD-10-CM | POA: Diagnosis not present

## 2019-01-12 DIAGNOSIS — R1314 Dysphagia, pharyngoesophageal phase: Secondary | ICD-10-CM | POA: Diagnosis not present

## 2019-01-13 DIAGNOSIS — Z4781 Encounter for orthopedic aftercare following surgical amputation: Secondary | ICD-10-CM | POA: Diagnosis not present

## 2019-01-13 DIAGNOSIS — M6281 Muscle weakness (generalized): Secondary | ICD-10-CM | POA: Diagnosis not present

## 2019-01-13 DIAGNOSIS — M6250 Muscle wasting and atrophy, not elsewhere classified, unspecified site: Secondary | ICD-10-CM | POA: Diagnosis not present

## 2019-01-13 DIAGNOSIS — R1314 Dysphagia, pharyngoesophageal phase: Secondary | ICD-10-CM | POA: Diagnosis not present

## 2019-01-13 DIAGNOSIS — L89154 Pressure ulcer of sacral region, stage 4: Secondary | ICD-10-CM | POA: Diagnosis not present

## 2019-01-13 DIAGNOSIS — Z89612 Acquired absence of left leg above knee: Secondary | ICD-10-CM | POA: Diagnosis not present

## 2019-01-13 DIAGNOSIS — N390 Urinary tract infection, site not specified: Secondary | ICD-10-CM | POA: Diagnosis not present

## 2019-01-13 DIAGNOSIS — Z7409 Other reduced mobility: Secondary | ICD-10-CM | POA: Diagnosis not present

## 2019-01-13 DIAGNOSIS — L89150 Pressure ulcer of sacral region, unstageable: Secondary | ICD-10-CM | POA: Diagnosis not present

## 2019-01-14 DIAGNOSIS — Z4781 Encounter for orthopedic aftercare following surgical amputation: Secondary | ICD-10-CM | POA: Diagnosis not present

## 2019-01-14 DIAGNOSIS — L89154 Pressure ulcer of sacral region, stage 4: Secondary | ICD-10-CM | POA: Diagnosis not present

## 2019-01-14 DIAGNOSIS — M6250 Muscle wasting and atrophy, not elsewhere classified, unspecified site: Secondary | ICD-10-CM | POA: Diagnosis not present

## 2019-01-14 DIAGNOSIS — Z89612 Acquired absence of left leg above knee: Secondary | ICD-10-CM | POA: Diagnosis not present

## 2019-01-14 DIAGNOSIS — R1314 Dysphagia, pharyngoesophageal phase: Secondary | ICD-10-CM | POA: Diagnosis not present

## 2019-01-14 DIAGNOSIS — M6281 Muscle weakness (generalized): Secondary | ICD-10-CM | POA: Diagnosis not present

## 2019-01-14 DIAGNOSIS — N390 Urinary tract infection, site not specified: Secondary | ICD-10-CM | POA: Diagnosis not present

## 2019-01-15 DIAGNOSIS — M6281 Muscle weakness (generalized): Secondary | ICD-10-CM | POA: Diagnosis not present

## 2019-01-15 DIAGNOSIS — N390 Urinary tract infection, site not specified: Secondary | ICD-10-CM | POA: Diagnosis not present

## 2019-01-15 DIAGNOSIS — R1314 Dysphagia, pharyngoesophageal phase: Secondary | ICD-10-CM | POA: Diagnosis not present

## 2019-01-15 DIAGNOSIS — Z4781 Encounter for orthopedic aftercare following surgical amputation: Secondary | ICD-10-CM | POA: Diagnosis not present

## 2019-01-15 DIAGNOSIS — L89150 Pressure ulcer of sacral region, unstageable: Secondary | ICD-10-CM | POA: Diagnosis not present

## 2019-01-15 DIAGNOSIS — Z89612 Acquired absence of left leg above knee: Secondary | ICD-10-CM | POA: Diagnosis not present

## 2019-01-15 DIAGNOSIS — L89154 Pressure ulcer of sacral region, stage 4: Secondary | ICD-10-CM | POA: Diagnosis not present

## 2019-01-15 DIAGNOSIS — M6250 Muscle wasting and atrophy, not elsewhere classified, unspecified site: Secondary | ICD-10-CM | POA: Diagnosis not present

## 2019-01-18 ENCOUNTER — Other Ambulatory Visit: Payer: Self-pay

## 2019-01-18 ENCOUNTER — Telehealth (INDEPENDENT_AMBULATORY_CARE_PROVIDER_SITE_OTHER): Payer: Medicare Other | Admitting: Gastroenterology

## 2019-01-18 DIAGNOSIS — Z4781 Encounter for orthopedic aftercare following surgical amputation: Secondary | ICD-10-CM | POA: Diagnosis not present

## 2019-01-18 DIAGNOSIS — R1310 Dysphagia, unspecified: Secondary | ICD-10-CM

## 2019-01-18 DIAGNOSIS — Z89612 Acquired absence of left leg above knee: Secondary | ICD-10-CM | POA: Diagnosis not present

## 2019-01-18 DIAGNOSIS — L89154 Pressure ulcer of sacral region, stage 4: Secondary | ICD-10-CM | POA: Diagnosis not present

## 2019-01-18 DIAGNOSIS — N390 Urinary tract infection, site not specified: Secondary | ICD-10-CM | POA: Diagnosis not present

## 2019-01-18 DIAGNOSIS — R1319 Other dysphagia: Secondary | ICD-10-CM

## 2019-01-18 DIAGNOSIS — M6281 Muscle weakness (generalized): Secondary | ICD-10-CM | POA: Diagnosis not present

## 2019-01-18 DIAGNOSIS — M6250 Muscle wasting and atrophy, not elsewhere classified, unspecified site: Secondary | ICD-10-CM | POA: Diagnosis not present

## 2019-01-18 DIAGNOSIS — R1314 Dysphagia, pharyngoesophageal phase: Secondary | ICD-10-CM | POA: Diagnosis not present

## 2019-01-18 MED ORDER — OMEPRAZOLE 40 MG PO CPDR: 40.0000 mg | DELAYED_RELEASE_CAPSULE | Freq: Two times a day (BID) | ORAL | 11 refills | Status: AC

## 2019-01-18 NOTE — Patient Instructions (Signed)
Increase omeprazole 40mg  po bid. Can open the capsules and give it in applesauce.  Do not chew. -EGD with dil at [facility] (patient on oxygen and with multiple comorbid conditions) when OK with NH/hospital to get semi-elective procedures done. Cannot get it done d/t [id number] per NH policy as yet. OK with [facility]. -I have instructed patient that she needs to chew food especially meats and breads well and eat slowly. -RTC 12 weeks for televisit.  #2. Chronic constipation with H/O diverticulitis ([facility]- CT- cipro/flagyl) -Continue miralax 17g po qd -Add colace 1 tab po qd -Minimize pain medications.  Thank you,  Dr. Jackquline Denmark

## 2019-01-18 NOTE — Progress Notes (Signed)
Chief Complaint: FU  Referring Provider:  Cher Nakai, MD      ASSESSMENT AND PLAN;   #1. GERD with HH with dysphagia s/p dilatation 2018.  -Increase omeprazole 40mg  po bid. Can open the capsules and give it in applesauce.  Do not chew. -EGD with dil at [facility] (patient on oxygen and with multiple comorbid conditions) when OK with NH/hospital to get semi-elective procedures done. Cannot get it done d/t POEUM-35 per NH policy as yet. OK with [facility]. -I have instructed patient that she needs to chew food especially meats and breads well and eat slowly. -RTC 12 weeks for televisit.  #2. Chronic constipation with H/O diverticulitis (RH- CT- cipro/flagyl) -Continue miralax 17g po qd -Add colace 1 tab po qd -Minimize pain medications. -Discussed above with the nursing staff at [facility].   HPI:    Whitney Steele is a 73 y.o. 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 Getting chopped food. Tolerating well  She did well after previous EGD with eso dil 09/12/2016 which showed distal esophageal Schatzki's ring status post esophageal dilatation to 3 [place], 5 cm hiatal hernia, mid esophageal diverticulum and a superficial gastric ulcer with negative biopsies.  Patient also had a highly tortuous esophagus suggestive of esophageal dysmotility/presbyesophagus.  This was confirmed with barium swallow.  She has stopped taking omeprazole on her own.  She was recently seen in the emergency room at [facility] with left lower quadrant abdominal pain.  She underwent CT scan of the abdomen pelvis which showed acute diverticulitis.  She was treated with Cipro and Flagyl which she  has recently finished.  She does feel better.  She had refused screening colonoscopy in the past.  She still wants to hold off at the present time.  History of chronic constipation due to pain medications with associated abdominal bloating and hard stools.  Refused colon several times in past.  Past Medical History:  Diagnosis Date  . Abnormal AST and ALT   . Allergic rhinitis   . Anemia   . Benign essential hypertension   . 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 COPD (chronic obstructive pulmonary disease) ([place])   . Depression with anxiety   . Dysphagia   . Foley catheter in place   . GERD (gastroesophageal reflux disease)   . Hypercholesterolemia   . Hypertension   . Hypothyroidism   . Insomnia   . Osteoporosis   . Rheumatoid arthritis [facility])     Past Surgical History:  Procedure Laterality Date  . ABDOMINAL HYSTERECTOMY    . AMPUTATION Left 10/09/2018   Procedure: LEFT ABOVE KNEE AMPUTATION;  Surgeon: Newt Minion, MD;  Location: [facility];  Service: Orthopedics;  Laterality: Left;  . 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 CARPAL TUNNEL RELEASE    . ESOPHAGOGASTRODUODENOSCOPY  09/12/2016   Distal esophageal Schatzkis ring. Moderate hh. Highly tourturous esophagus. Mild esophageal diverticulum. Superficial gastric ulcer. Esophagus Bx: benign squamoglandular mucosa with chronic inflammation and squamous hyperplasia suggestive of gastroesophageal reflux disease. Stomach Bx: Ulceration and reactive gastropthy.  . ESOPHAGOGASTRODUODENOSCOPY  09/12/2016   Distal esophageal Schatzkis ring status post  esophageal dilitation. Moderate hiatal hernia. Highly torturous esophagus suggestive of esophageal dysmotility/presbyesophagus. Mid esophageal diverticulum. Superficial gastric ulcer.   . INCISIONAL HERNIA REPAIR    . LAPAROSCOPIC CHOLECYSTECTOMY    . OVARY SURGERY     removal of ovarian masses    Family History  Family history unknown: Yes     Social History   Tobacco Use  . Smoking status: Never Smoker  . Smokeless tobacco: Never Used  Substance Use Topics  . Alcohol use: Not Currently  . Drug use: Never    Current Outpatient Medications  Medication Sig Dispense Refill  . Abatacept (ORENCIA) 125 MG/ML SOSY Inject 125 mg into the skin every Thursday.    Marland Kitchen albuterol (PROAIR HFA) 108 (90 Base) MCG/ACT inhaler Inhale 2 puffs into the lungs every 6 (six) hours as needed for wheezing or shortness of breath.     Marland Kitchen albuterol (VENTOLIN HFA) 108 (90 Base) MCG/ACT inhaler Inhale 2 puffs into the lungs every 6 (six) hours as needed for wheezing or shortness of breath.    . ALPRAZolam (XANAX) 0.5 MG tablet Take 1 tablet (0.5 mg total) by mouth 2 (two) times daily. 30 tablet 0  . aspirin EC 81 MG tablet Take 81 mg by mouth daily.     Marland Kitchen atorvastatin (LIPITOR) 20 MG tablet Take 20 mg by mouth at bedtime.     Marland Kitchen buPROPion (WELLBUTRIN XL) 300 MG 24 hr tablet Take 300 mg by mouth daily.    . collagenase (SANTYL) ointment Apply 1 application topically daily as needed (wound care). Wound is packed with gauze and then silver aginamnat and cover with collagen    . diltiazem (CARDIZEM) 30 MG tablet Take 30 mg by mouth 3 (three) times daily.     . DULoxetine (CYMBALTA) 60 MG capsule Take 60 mg by mouth daily.    . feeding supplement, ENSURE ENLIVE, (ENSURE ENLIVE) LIQD Take 237 mLs by mouth 3 (three) times daily with meals. 237 mL 12  . ferrous sulfate 300 (60 Fe) MG/5ML syrup Take 5 mLs (300 mg total) by mouth daily with breakfast. 150 mL 3  . folic acid (FOLVITE) 1 MG tablet Take 1 tablet (1 mg total) by mouth daily.    Marland Kitchen gabapentin (NEURONTIN) 100 MG capsule Take 1 capsule (100 mg total) by mouth 2 (two) times daily. 60 capsule 2  . geriatric multivitamins-minerals (ELDERTONIC/GEVRABON) LIQD Take 15 mLs by mouth daily.    . isosorbide mononitrate (IMDUR) 30 MG 24 hr tablet Take 30 mg by mouth 2 (two) times daily.     Marland Kitchen levETIRAcetam (KEPPRA) 500  MG tablet Take 500 mg by mouth 2 (two) times daily.    Marland Kitchen omeprazole (PRILOSEC) 40 MG capsule Take 1 capsule (40 mg total) by mouth daily. 30 capsule 6  . oxyCODONE-acetaminophen (PERCOCET/ROXICET) 5-325 MG tablet Take 1 tablet by mouth every 6 (six) hours as needed for moderate pain. (Patient taking differently: Take 1 tablet by mouth every 12 (twelve) hours as needed for moderate pain. ) 30 tablet 0  . polyethylene glycol powder (GLYCOLAX/MIRALAX) powder Take 17 g by mouth daily. 255 g 3  . ranolazine (RANEXA) 500 MG 12 hr tablet Take 500 mg by mouth 2 (two) times daily.    Marland Kitchen thiamine 100 MG tablet Take 1 tablet (100 mg total) by mouth daily.    . vitamin C (ASCORBIC ACID) 500 MG tablet Take 500 mg by mouth 2 (two) times daily.     No current facility-administered medications for this  visit.     Allergies  Allergen Reactions  . Iodinated Diagnostic Agents Hives  . Tape Other (See Comments)    Tears skin - please use paper tape    Review of Systems:  neg  Physical Exam:    There were no vitals taken for this visit. There were no vitals filed for this visit. Not examined since it was a tele-visit.   Edman Circle, MD 01/18/2019, 11:07 AM  Cc: Simone Curia, MD

## 2019-01-19 DIAGNOSIS — Z89612 Acquired absence of left leg above knee: Secondary | ICD-10-CM | POA: Diagnosis not present

## 2019-01-19 DIAGNOSIS — M6250 Muscle wasting and atrophy, not elsewhere classified, unspecified site: Secondary | ICD-10-CM | POA: Diagnosis not present

## 2019-01-19 DIAGNOSIS — L89154 Pressure ulcer of sacral region, stage 4: Secondary | ICD-10-CM | POA: Diagnosis not present

## 2019-01-19 DIAGNOSIS — Z4781 Encounter for orthopedic aftercare following surgical amputation: Secondary | ICD-10-CM | POA: Diagnosis not present

## 2019-01-19 DIAGNOSIS — M6281 Muscle weakness (generalized): Secondary | ICD-10-CM | POA: Diagnosis not present

## 2019-01-19 DIAGNOSIS — N390 Urinary tract infection, site not specified: Secondary | ICD-10-CM | POA: Diagnosis not present

## 2019-01-19 DIAGNOSIS — R1314 Dysphagia, pharyngoesophageal phase: Secondary | ICD-10-CM | POA: Diagnosis not present

## 2019-01-20 DIAGNOSIS — Z7409 Other reduced mobility: Secondary | ICD-10-CM | POA: Diagnosis not present

## 2019-01-20 DIAGNOSIS — Z4781 Encounter for orthopedic aftercare following surgical amputation: Secondary | ICD-10-CM | POA: Diagnosis not present

## 2019-01-20 DIAGNOSIS — N390 Urinary tract infection, site not specified: Secondary | ICD-10-CM | POA: Diagnosis not present

## 2019-01-20 DIAGNOSIS — R1314 Dysphagia, pharyngoesophageal phase: Secondary | ICD-10-CM | POA: Diagnosis not present

## 2019-01-20 DIAGNOSIS — L89150 Pressure ulcer of sacral region, unstageable: Secondary | ICD-10-CM | POA: Diagnosis not present

## 2019-01-20 DIAGNOSIS — M6250 Muscle wasting and atrophy, not elsewhere classified, unspecified site: Secondary | ICD-10-CM | POA: Diagnosis not present

## 2019-01-20 DIAGNOSIS — Z89612 Acquired absence of left leg above knee: Secondary | ICD-10-CM | POA: Diagnosis not present

## 2019-01-20 DIAGNOSIS — M6281 Muscle weakness (generalized): Secondary | ICD-10-CM | POA: Diagnosis not present

## 2019-01-20 DIAGNOSIS — L89154 Pressure ulcer of sacral region, stage 4: Secondary | ICD-10-CM | POA: Diagnosis not present

## 2019-01-21 DIAGNOSIS — M6250 Muscle wasting and atrophy, not elsewhere classified, unspecified site: Secondary | ICD-10-CM | POA: Diagnosis not present

## 2019-01-21 DIAGNOSIS — Z89612 Acquired absence of left leg above knee: Secondary | ICD-10-CM | POA: Diagnosis not present

## 2019-01-21 DIAGNOSIS — M069 Rheumatoid arthritis, unspecified: Secondary | ICD-10-CM | POA: Diagnosis not present

## 2019-01-21 DIAGNOSIS — M6281 Muscle weakness (generalized): Secondary | ICD-10-CM | POA: Diagnosis not present

## 2019-01-21 DIAGNOSIS — N390 Urinary tract infection, site not specified: Secondary | ICD-10-CM | POA: Diagnosis not present

## 2019-01-21 DIAGNOSIS — M1990 Unspecified osteoarthritis, unspecified site: Secondary | ICD-10-CM | POA: Diagnosis not present

## 2019-01-21 DIAGNOSIS — R262 Difficulty in walking, not elsewhere classified: Secondary | ICD-10-CM | POA: Diagnosis not present

## 2019-01-21 DIAGNOSIS — R1314 Dysphagia, pharyngoesophageal phase: Secondary | ICD-10-CM | POA: Diagnosis not present

## 2019-01-21 DIAGNOSIS — L89154 Pressure ulcer of sacral region, stage 4: Secondary | ICD-10-CM | POA: Diagnosis not present

## 2019-01-21 DIAGNOSIS — Z4781 Encounter for orthopedic aftercare following surgical amputation: Secondary | ICD-10-CM | POA: Diagnosis not present

## 2019-01-21 DIAGNOSIS — S72002D Fracture of unspecified part of neck of left femur, subsequent encounter for closed fracture with routine healing: Secondary | ICD-10-CM | POA: Diagnosis not present

## 2019-01-22 DIAGNOSIS — L98429 Non-pressure chronic ulcer of back with unspecified severity: Secondary | ICD-10-CM | POA: Diagnosis not present

## 2019-01-22 DIAGNOSIS — L89150 Pressure ulcer of sacral region, unstageable: Secondary | ICD-10-CM | POA: Diagnosis not present

## 2019-01-22 DIAGNOSIS — N390 Urinary tract infection, site not specified: Secondary | ICD-10-CM | POA: Diagnosis not present

## 2019-01-22 DIAGNOSIS — L89154 Pressure ulcer of sacral region, stage 4: Secondary | ICD-10-CM | POA: Diagnosis not present

## 2019-01-22 DIAGNOSIS — Z89612 Acquired absence of left leg above knee: Secondary | ICD-10-CM | POA: Diagnosis not present

## 2019-01-22 DIAGNOSIS — M6250 Muscle wasting and atrophy, not elsewhere classified, unspecified site: Secondary | ICD-10-CM | POA: Diagnosis not present

## 2019-01-22 DIAGNOSIS — M6281 Muscle weakness (generalized): Secondary | ICD-10-CM | POA: Diagnosis not present

## 2019-01-22 DIAGNOSIS — G8929 Other chronic pain: Secondary | ICD-10-CM | POA: Diagnosis not present

## 2019-01-22 DIAGNOSIS — Z4781 Encounter for orthopedic aftercare following surgical amputation: Secondary | ICD-10-CM | POA: Diagnosis not present

## 2019-01-22 DIAGNOSIS — R1314 Dysphagia, pharyngoesophageal phase: Secondary | ICD-10-CM | POA: Diagnosis not present

## 2019-01-25 DIAGNOSIS — Z4781 Encounter for orthopedic aftercare following surgical amputation: Secondary | ICD-10-CM | POA: Diagnosis not present

## 2019-01-25 DIAGNOSIS — R1314 Dysphagia, pharyngoesophageal phase: Secondary | ICD-10-CM | POA: Diagnosis not present

## 2019-01-25 DIAGNOSIS — L89154 Pressure ulcer of sacral region, stage 4: Secondary | ICD-10-CM | POA: Diagnosis not present

## 2019-01-25 DIAGNOSIS — Z89612 Acquired absence of left leg above knee: Secondary | ICD-10-CM | POA: Diagnosis not present

## 2019-01-25 DIAGNOSIS — N390 Urinary tract infection, site not specified: Secondary | ICD-10-CM | POA: Diagnosis not present

## 2019-01-25 DIAGNOSIS — M6281 Muscle weakness (generalized): Secondary | ICD-10-CM | POA: Diagnosis not present

## 2019-01-25 DIAGNOSIS — M6250 Muscle wasting and atrophy, not elsewhere classified, unspecified site: Secondary | ICD-10-CM | POA: Diagnosis not present

## 2019-01-26 DIAGNOSIS — M6281 Muscle weakness (generalized): Secondary | ICD-10-CM | POA: Diagnosis not present

## 2019-01-26 DIAGNOSIS — N390 Urinary tract infection, site not specified: Secondary | ICD-10-CM | POA: Diagnosis not present

## 2019-01-26 DIAGNOSIS — M6250 Muscle wasting and atrophy, not elsewhere classified, unspecified site: Secondary | ICD-10-CM | POA: Diagnosis not present

## 2019-01-26 DIAGNOSIS — Z89612 Acquired absence of left leg above knee: Secondary | ICD-10-CM | POA: Diagnosis not present

## 2019-01-26 DIAGNOSIS — R1314 Dysphagia, pharyngoesophageal phase: Secondary | ICD-10-CM | POA: Diagnosis not present

## 2019-01-26 DIAGNOSIS — L89154 Pressure ulcer of sacral region, stage 4: Secondary | ICD-10-CM | POA: Diagnosis not present

## 2019-01-26 DIAGNOSIS — Z4781 Encounter for orthopedic aftercare following surgical amputation: Secondary | ICD-10-CM | POA: Diagnosis not present

## 2019-01-27 DIAGNOSIS — L89154 Pressure ulcer of sacral region, stage 4: Secondary | ICD-10-CM | POA: Diagnosis not present

## 2019-01-27 DIAGNOSIS — R1314 Dysphagia, pharyngoesophageal phase: Secondary | ICD-10-CM | POA: Diagnosis not present

## 2019-01-27 DIAGNOSIS — M6250 Muscle wasting and atrophy, not elsewhere classified, unspecified site: Secondary | ICD-10-CM | POA: Diagnosis not present

## 2019-01-27 DIAGNOSIS — Z4781 Encounter for orthopedic aftercare following surgical amputation: Secondary | ICD-10-CM | POA: Diagnosis not present

## 2019-01-27 DIAGNOSIS — N390 Urinary tract infection, site not specified: Secondary | ICD-10-CM | POA: Diagnosis not present

## 2019-01-27 DIAGNOSIS — M6281 Muscle weakness (generalized): Secondary | ICD-10-CM | POA: Diagnosis not present

## 2019-01-27 DIAGNOSIS — Z7409 Other reduced mobility: Secondary | ICD-10-CM | POA: Diagnosis not present

## 2019-01-27 DIAGNOSIS — L89150 Pressure ulcer of sacral region, unstageable: Secondary | ICD-10-CM | POA: Diagnosis not present

## 2019-01-27 DIAGNOSIS — Z89612 Acquired absence of left leg above knee: Secondary | ICD-10-CM | POA: Diagnosis not present

## 2019-01-28 DIAGNOSIS — N390 Urinary tract infection, site not specified: Secondary | ICD-10-CM | POA: Diagnosis not present

## 2019-01-28 DIAGNOSIS — Z89612 Acquired absence of left leg above knee: Secondary | ICD-10-CM | POA: Diagnosis not present

## 2019-01-28 DIAGNOSIS — L89154 Pressure ulcer of sacral region, stage 4: Secondary | ICD-10-CM | POA: Diagnosis not present

## 2019-01-28 DIAGNOSIS — R1314 Dysphagia, pharyngoesophageal phase: Secondary | ICD-10-CM | POA: Diagnosis not present

## 2019-01-28 DIAGNOSIS — M6250 Muscle wasting and atrophy, not elsewhere classified, unspecified site: Secondary | ICD-10-CM | POA: Diagnosis not present

## 2019-01-28 DIAGNOSIS — Z4781 Encounter for orthopedic aftercare following surgical amputation: Secondary | ICD-10-CM | POA: Diagnosis not present

## 2019-01-28 DIAGNOSIS — E039 Hypothyroidism, unspecified: Secondary | ICD-10-CM | POA: Diagnosis not present

## 2019-01-28 DIAGNOSIS — M6281 Muscle weakness (generalized): Secondary | ICD-10-CM | POA: Diagnosis not present

## 2019-01-29 DIAGNOSIS — R1314 Dysphagia, pharyngoesophageal phase: Secondary | ICD-10-CM | POA: Diagnosis not present

## 2019-01-29 DIAGNOSIS — M6281 Muscle weakness (generalized): Secondary | ICD-10-CM | POA: Diagnosis not present

## 2019-01-29 DIAGNOSIS — Z89612 Acquired absence of left leg above knee: Secondary | ICD-10-CM | POA: Diagnosis not present

## 2019-01-29 DIAGNOSIS — L89154 Pressure ulcer of sacral region, stage 4: Secondary | ICD-10-CM | POA: Diagnosis not present

## 2019-01-29 DIAGNOSIS — Z4781 Encounter for orthopedic aftercare following surgical amputation: Secondary | ICD-10-CM | POA: Diagnosis not present

## 2019-01-29 DIAGNOSIS — M6250 Muscle wasting and atrophy, not elsewhere classified, unspecified site: Secondary | ICD-10-CM | POA: Diagnosis not present

## 2019-01-29 DIAGNOSIS — N390 Urinary tract infection, site not specified: Secondary | ICD-10-CM | POA: Diagnosis not present

## 2019-01-31 DIAGNOSIS — Z4781 Encounter for orthopedic aftercare following surgical amputation: Secondary | ICD-10-CM | POA: Diagnosis not present

## 2019-01-31 DIAGNOSIS — M6250 Muscle wasting and atrophy, not elsewhere classified, unspecified site: Secondary | ICD-10-CM | POA: Diagnosis not present

## 2019-01-31 DIAGNOSIS — R1314 Dysphagia, pharyngoesophageal phase: Secondary | ICD-10-CM | POA: Diagnosis not present

## 2019-01-31 DIAGNOSIS — N390 Urinary tract infection, site not specified: Secondary | ICD-10-CM | POA: Diagnosis not present

## 2019-01-31 DIAGNOSIS — Z89612 Acquired absence of left leg above knee: Secondary | ICD-10-CM | POA: Diagnosis not present

## 2019-01-31 DIAGNOSIS — M6281 Muscle weakness (generalized): Secondary | ICD-10-CM | POA: Diagnosis not present

## 2019-01-31 DIAGNOSIS — L89154 Pressure ulcer of sacral region, stage 4: Secondary | ICD-10-CM | POA: Diagnosis not present

## 2019-02-01 DIAGNOSIS — Z89612 Acquired absence of left leg above knee: Secondary | ICD-10-CM | POA: Diagnosis not present

## 2019-02-01 DIAGNOSIS — L603 Nail dystrophy: Secondary | ICD-10-CM | POA: Diagnosis not present

## 2019-02-01 DIAGNOSIS — I739 Peripheral vascular disease, unspecified: Secondary | ICD-10-CM | POA: Diagnosis not present

## 2019-02-01 DIAGNOSIS — B351 Tinea unguium: Secondary | ICD-10-CM | POA: Diagnosis not present

## 2019-02-01 DIAGNOSIS — M6281 Muscle weakness (generalized): Secondary | ICD-10-CM | POA: Diagnosis not present

## 2019-02-01 DIAGNOSIS — L89154 Pressure ulcer of sacral region, stage 4: Secondary | ICD-10-CM | POA: Diagnosis not present

## 2019-02-01 DIAGNOSIS — M6250 Muscle wasting and atrophy, not elsewhere classified, unspecified site: Secondary | ICD-10-CM | POA: Diagnosis not present

## 2019-02-01 DIAGNOSIS — N390 Urinary tract infection, site not specified: Secondary | ICD-10-CM | POA: Diagnosis not present

## 2019-02-01 DIAGNOSIS — Q845 Enlarged and hypertrophic nails: Secondary | ICD-10-CM | POA: Diagnosis not present

## 2019-02-01 DIAGNOSIS — R1314 Dysphagia, pharyngoesophageal phase: Secondary | ICD-10-CM | POA: Diagnosis not present

## 2019-02-01 DIAGNOSIS — Z4781 Encounter for orthopedic aftercare following surgical amputation: Secondary | ICD-10-CM | POA: Diagnosis not present

## 2019-02-01 NOTE — Progress Notes (Signed)
This service was provided via telemedicine.  The patient was located at nursing home.  The provider was located in office.  The patient did consent to this telephone visit and is aware of possible charges through their insurance for this visit.  The patient was referred by Dr. Truman Hayward.  The other persons participating in this telemedicine service were patient's nurse and their role was coordination of care and explanation.  Time spent on call/coordination of care: 25 min  Carmell Austria MD

## 2019-02-01 NOTE — Addendum Note (Signed)
Addended by: Jackquline Denmark on: 02/01/2019 08:47 AM   Modules accepted: Level of Service

## 2019-02-02 DIAGNOSIS — R1314 Dysphagia, pharyngoesophageal phase: Secondary | ICD-10-CM | POA: Diagnosis not present

## 2019-02-02 DIAGNOSIS — N390 Urinary tract infection, site not specified: Secondary | ICD-10-CM | POA: Diagnosis not present

## 2019-02-02 DIAGNOSIS — M6281 Muscle weakness (generalized): Secondary | ICD-10-CM | POA: Diagnosis not present

## 2019-02-02 DIAGNOSIS — Z89612 Acquired absence of left leg above knee: Secondary | ICD-10-CM | POA: Diagnosis not present

## 2019-02-02 DIAGNOSIS — L89154 Pressure ulcer of sacral region, stage 4: Secondary | ICD-10-CM | POA: Diagnosis not present

## 2019-02-02 DIAGNOSIS — M6250 Muscle wasting and atrophy, not elsewhere classified, unspecified site: Secondary | ICD-10-CM | POA: Diagnosis not present

## 2019-02-02 DIAGNOSIS — Z4781 Encounter for orthopedic aftercare following surgical amputation: Secondary | ICD-10-CM | POA: Diagnosis not present

## 2019-02-03 DIAGNOSIS — N390 Urinary tract infection, site not specified: Secondary | ICD-10-CM | POA: Diagnosis not present

## 2019-02-03 DIAGNOSIS — L89150 Pressure ulcer of sacral region, unstageable: Secondary | ICD-10-CM | POA: Diagnosis not present

## 2019-02-03 DIAGNOSIS — L89154 Pressure ulcer of sacral region, stage 4: Secondary | ICD-10-CM | POA: Diagnosis not present

## 2019-02-03 DIAGNOSIS — M6281 Muscle weakness (generalized): Secondary | ICD-10-CM | POA: Diagnosis not present

## 2019-02-03 DIAGNOSIS — R1314 Dysphagia, pharyngoesophageal phase: Secondary | ICD-10-CM | POA: Diagnosis not present

## 2019-02-03 DIAGNOSIS — Z89612 Acquired absence of left leg above knee: Secondary | ICD-10-CM | POA: Diagnosis not present

## 2019-02-03 DIAGNOSIS — Z4781 Encounter for orthopedic aftercare following surgical amputation: Secondary | ICD-10-CM | POA: Diagnosis not present

## 2019-02-03 DIAGNOSIS — M6250 Muscle wasting and atrophy, not elsewhere classified, unspecified site: Secondary | ICD-10-CM | POA: Diagnosis not present

## 2019-02-03 DIAGNOSIS — Z7409 Other reduced mobility: Secondary | ICD-10-CM | POA: Diagnosis not present

## 2019-02-04 DIAGNOSIS — M6250 Muscle wasting and atrophy, not elsewhere classified, unspecified site: Secondary | ICD-10-CM | POA: Diagnosis not present

## 2019-02-04 DIAGNOSIS — M6281 Muscle weakness (generalized): Secondary | ICD-10-CM | POA: Diagnosis not present

## 2019-02-04 DIAGNOSIS — L89154 Pressure ulcer of sacral region, stage 4: Secondary | ICD-10-CM | POA: Diagnosis not present

## 2019-02-04 DIAGNOSIS — Z4781 Encounter for orthopedic aftercare following surgical amputation: Secondary | ICD-10-CM | POA: Diagnosis not present

## 2019-02-04 DIAGNOSIS — N390 Urinary tract infection, site not specified: Secondary | ICD-10-CM | POA: Diagnosis not present

## 2019-02-04 DIAGNOSIS — Z89612 Acquired absence of left leg above knee: Secondary | ICD-10-CM | POA: Diagnosis not present

## 2019-02-04 DIAGNOSIS — R1314 Dysphagia, pharyngoesophageal phase: Secondary | ICD-10-CM | POA: Diagnosis not present

## 2019-02-05 DIAGNOSIS — M6281 Muscle weakness (generalized): Secondary | ICD-10-CM | POA: Diagnosis not present

## 2019-02-05 DIAGNOSIS — Z4781 Encounter for orthopedic aftercare following surgical amputation: Secondary | ICD-10-CM | POA: Diagnosis not present

## 2019-02-05 DIAGNOSIS — M6250 Muscle wasting and atrophy, not elsewhere classified, unspecified site: Secondary | ICD-10-CM | POA: Diagnosis not present

## 2019-02-05 DIAGNOSIS — N390 Urinary tract infection, site not specified: Secondary | ICD-10-CM | POA: Diagnosis not present

## 2019-02-05 DIAGNOSIS — L89154 Pressure ulcer of sacral region, stage 4: Secondary | ICD-10-CM | POA: Diagnosis not present

## 2019-02-05 DIAGNOSIS — Z89612 Acquired absence of left leg above knee: Secondary | ICD-10-CM | POA: Diagnosis not present

## 2019-02-05 DIAGNOSIS — R1314 Dysphagia, pharyngoesophageal phase: Secondary | ICD-10-CM | POA: Diagnosis not present

## 2019-02-06 DIAGNOSIS — L89154 Pressure ulcer of sacral region, stage 4: Secondary | ICD-10-CM | POA: Diagnosis not present

## 2019-02-06 DIAGNOSIS — M6281 Muscle weakness (generalized): Secondary | ICD-10-CM | POA: Diagnosis not present

## 2019-02-06 DIAGNOSIS — N390 Urinary tract infection, site not specified: Secondary | ICD-10-CM | POA: Diagnosis not present

## 2019-02-06 DIAGNOSIS — J449 Chronic obstructive pulmonary disease, unspecified: Secondary | ICD-10-CM | POA: Diagnosis not present

## 2019-02-06 DIAGNOSIS — Z4781 Encounter for orthopedic aftercare following surgical amputation: Secondary | ICD-10-CM | POA: Diagnosis not present

## 2019-02-06 DIAGNOSIS — M6250 Muscle wasting and atrophy, not elsewhere classified, unspecified site: Secondary | ICD-10-CM | POA: Diagnosis not present

## 2019-02-06 DIAGNOSIS — R1314 Dysphagia, pharyngoesophageal phase: Secondary | ICD-10-CM | POA: Diagnosis not present

## 2019-02-06 DIAGNOSIS — Z89612 Acquired absence of left leg above knee: Secondary | ICD-10-CM | POA: Diagnosis not present

## 2019-02-08 DIAGNOSIS — L89150 Pressure ulcer of sacral region, unstageable: Secondary | ICD-10-CM | POA: Diagnosis not present

## 2019-02-08 DIAGNOSIS — M6281 Muscle weakness (generalized): Secondary | ICD-10-CM | POA: Diagnosis not present

## 2019-02-08 DIAGNOSIS — S72002S Fracture of unspecified part of neck of left femur, sequela: Secondary | ICD-10-CM | POA: Diagnosis not present

## 2019-02-08 DIAGNOSIS — M6250 Muscle wasting and atrophy, not elsewhere classified, unspecified site: Secondary | ICD-10-CM | POA: Diagnosis not present

## 2019-02-08 DIAGNOSIS — Z89612 Acquired absence of left leg above knee: Secondary | ICD-10-CM | POA: Diagnosis not present

## 2019-02-08 DIAGNOSIS — N390 Urinary tract infection, site not specified: Secondary | ICD-10-CM | POA: Diagnosis not present

## 2019-02-08 DIAGNOSIS — R296 Repeated falls: Secondary | ICD-10-CM | POA: Diagnosis not present

## 2019-02-08 DIAGNOSIS — R1314 Dysphagia, pharyngoesophageal phase: Secondary | ICD-10-CM | POA: Diagnosis not present

## 2019-02-08 DIAGNOSIS — Z978 Presence of other specified devices: Secondary | ICD-10-CM | POA: Diagnosis not present

## 2019-02-08 DIAGNOSIS — G8929 Other chronic pain: Secondary | ICD-10-CM | POA: Diagnosis not present

## 2019-02-08 DIAGNOSIS — L89154 Pressure ulcer of sacral region, stage 4: Secondary | ICD-10-CM | POA: Diagnosis not present

## 2019-02-08 DIAGNOSIS — Z4781 Encounter for orthopedic aftercare following surgical amputation: Secondary | ICD-10-CM | POA: Diagnosis not present

## 2019-03-09 ENCOUNTER — Ambulatory Visit: Payer: Medicare Other | Admitting: Orthopedic Surgery

## 2019-10-04 IMAGING — MR MRI HEAD WITHOUT CONTRAST
15 of 16 series · 44 of 48 positions shown · non-contrast
Comparison: CT head without contrast 08/07/2018. MRI brain
09/17/2017

CLINICAL DATA: Altered level of consciousness.  Ongoing confusion.

EXAM:
MRI HEAD WITHOUT CONTRAST
TECHNIQUE: Multiplanar, multiecho pulse sequences of the brain and surrounding
structures were obtained without intravenous contrast.

[Series 5: DWI · axial · 3.0mm · 0.88mm/px · z∈[-36,+109]mm · 6 of 100 slices shown (1 of 4)]
[im 1/100]
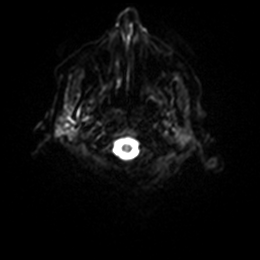
[im 20/100]
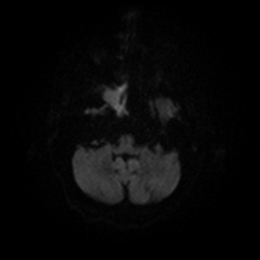
[im 40/100]
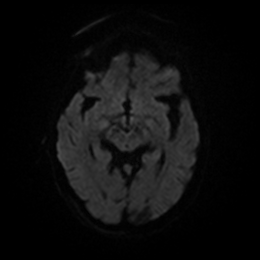
[im 60/100]
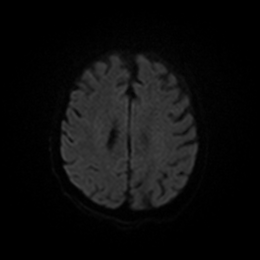
[im 80/100]
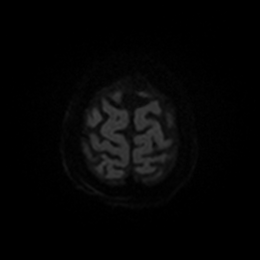
[im 100/100]
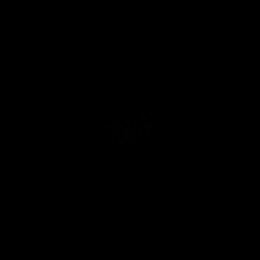

[Series 6: DWI · axial · 3.0mm · 0.88mm/px · z∈[-36,+106]mm · 2 of 49 slices shown (2 of 4)]
[im 1/49]
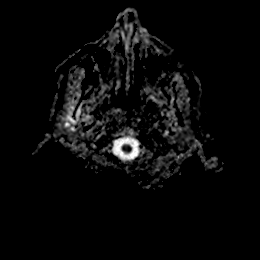
[im 49/49]
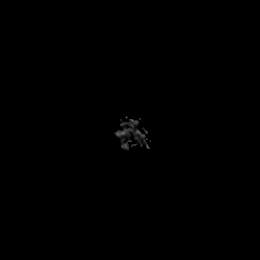

[Series 7: DWI · coronal · 4.0mm · 0.88mm/px · 4 of 64 slices shown (3 of 4)]
[im 1/64]
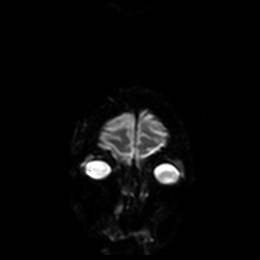
[im 22/64]
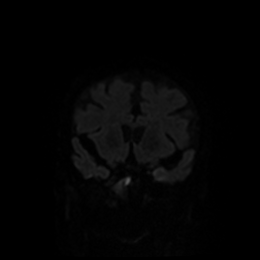
[im 43/64]
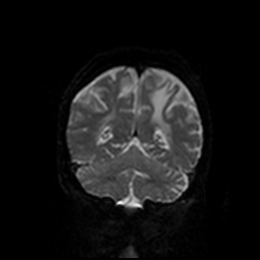
[im 64/64]
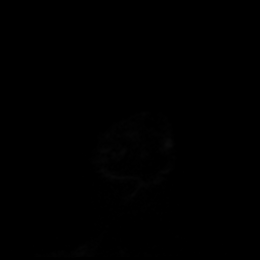

[Series 8: DWI · coronal · 4.0mm · 0.88mm/px · 2 of 32 slices shown (4 of 4)]
[im 1/32]
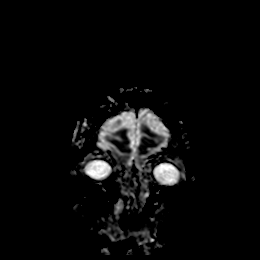
[im 32/32]
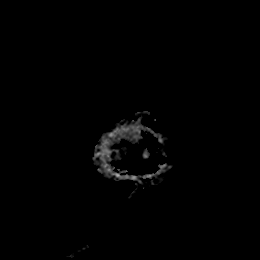

[Series 9: T2 · axial · 5.0mm · 0.72mm/px · z∈[-38,+104]mm · 2 of 25 slices shown (1 of 2)]
[im 1/25]
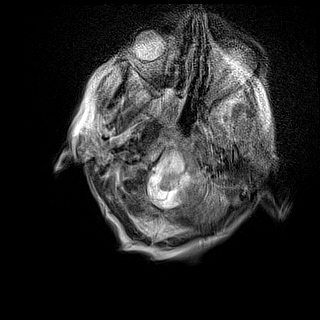
[im 25/25]
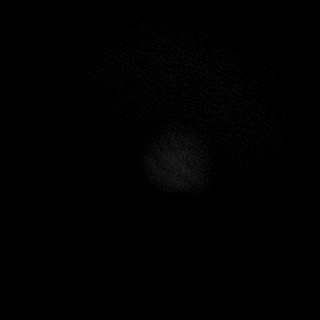

[Series 14: FLAIR · axial · 5.0mm · 0.90mm/px · z∈[-58,+77]mm · 2 of 25 slices shown (1 of 3)]
[im 1/25]
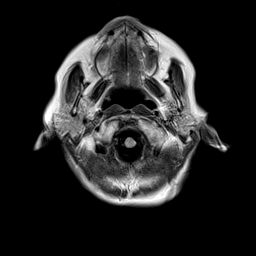
[im 25/25]
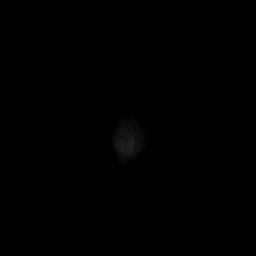

[Series 15: T2 · axial · 5.0mm · 0.72mm/px · z∈[-62,+73]mm · 2 of 25 slices shown (2 of 2)]
[im 1/25]
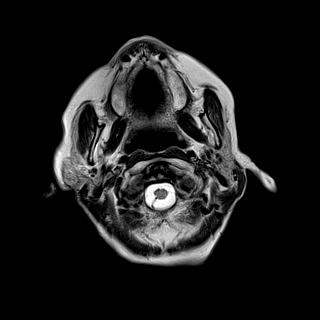
[im 25/25]
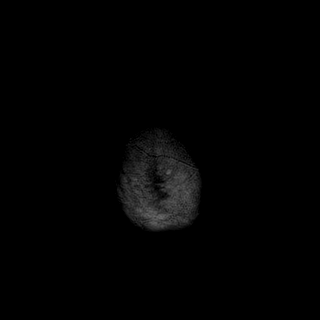

[Series 16: FLAIR · axial · 5.0mm · 0.90mm/px · z∈[-58,+77]mm · 2 of 25 slices shown (2 of 3)]
[im 1/25]
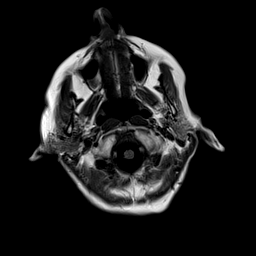
[im 25/25]
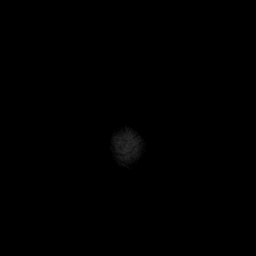

[Series 17: mag_images · axial · 3.0mm · 0.90mm/px · z∈[-79,+86]mm · 4 of 60 slices shown]
[im 1/60]
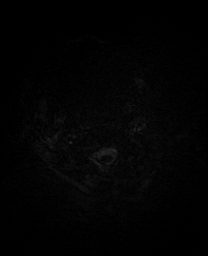
[im 20/60]
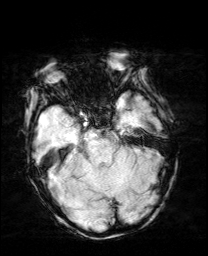
[im 40/60]
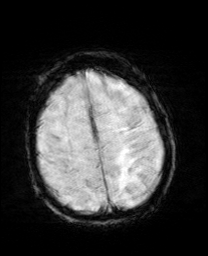
[im 60/60]
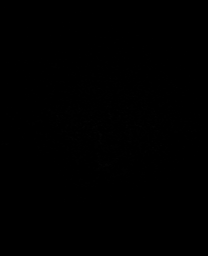

[Series 18: pha_images · axial · 3.0mm · 0.90mm/px · z∈[-79,+72]mm · 4 of 54 slices shown]
[im 1/54]
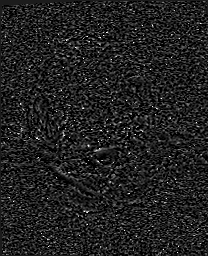
[im 18/54]
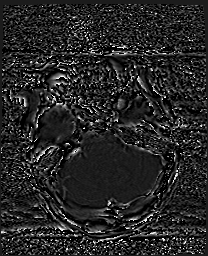
[im 36/54]
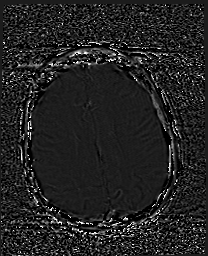
[im 54/54]
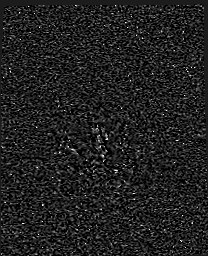

[Series 19: swi_images · axial · 3.0mm · 0.90mm/px · z∈[-79,+86]mm · 4 of 60 slices shown]
[im 1/60]
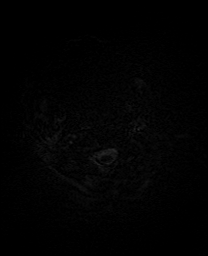
[im 20/60]
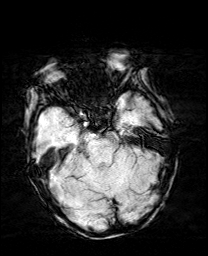
[im 40/60]
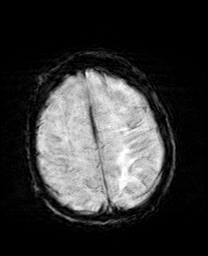
[im 60/60]
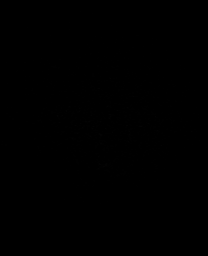

[Series 20: mip_images(sw) · axial · 24.0mm · 0.90mm/px · z∈[-69,+77]mm · 4 of 53 slices shown]
[im 1/53]
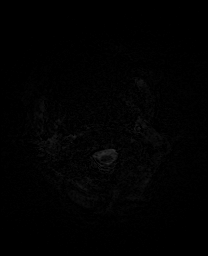
[im 18/53]
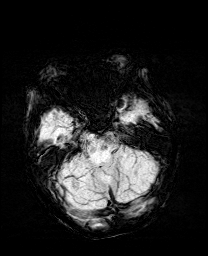
[im 35/53]
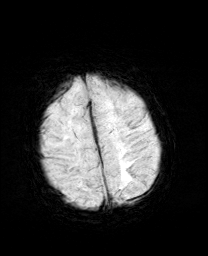
[im 53/53]
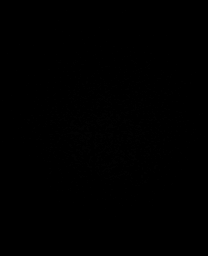

[Series 22: T2 post-contrast · coronal · 5.0mm · 0.72mm/px · 2 of 28 slices shown]
[im 1/28]
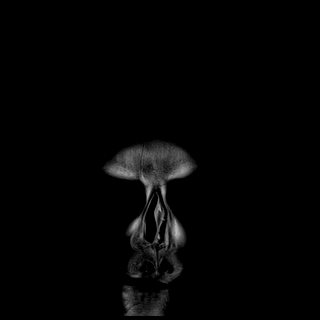
[im 28/28]
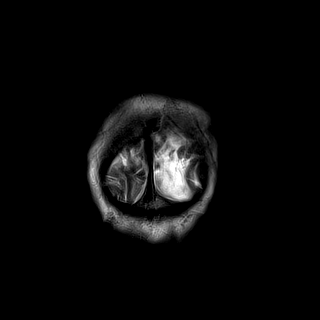

[Series 23: T1 · sagittal · 5.0mm · 0.75mm/px · 2 of 23 slices shown]
[im 1/23]
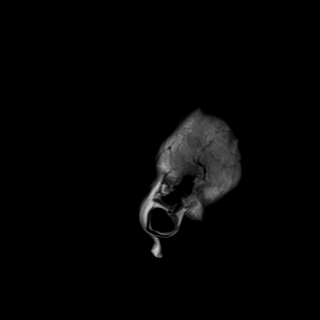
[im 23/23]
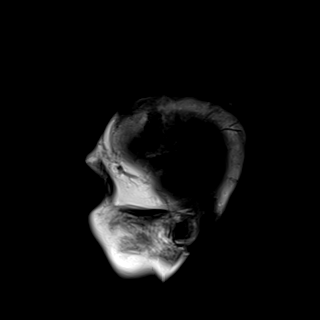

[Series 24: FLAIR · oblique · 3.0mm · 0.56mm/px · 2 of 25 slices shown (3 of 3)]
[im 1/25]
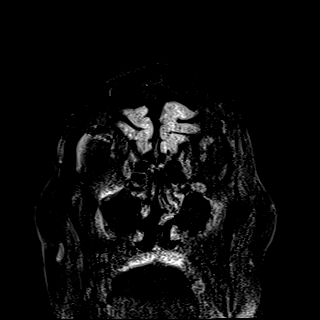
[im 25/25]
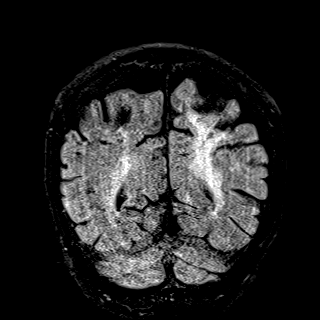

[44 of 48 positions shown; findings below may reference images not displayed]

FINDINGS: Brain: Chronic left parietal and occipital encephalomalacia is
stable. There is a remote right occipital lobe infarct as well.
Multiple remote lacunar infarcts of the cerebellum are stable, left
greater than right. Dilated perivascular spaces are noted within the
basal ganglia. Periventricular and subcortical white matter changes
bilaterally are stable as well. Subcortical infarcts are present in
the left frontal lobe.

No acute infarct, hemorrhage, or mass lesion is present. White
matter changes extend into the brainstem. The ventricles are
proportionate to the degree of atrophy. No significant extraaxial
fluid collection is present. The internal auditory canals are within
normal limits. The brainstem and cerebellum are within normal
limits.

Vascular: Flow is present in the major intracranial arteries.

Skull and upper cervical spine: Skull base is normal. Craniocervical
junction is normal.

Sinuses/Orbits: A fluid level is present in the right sphenoid
sinus. Left greater than right mastoid effusions are present. No
obstructing nasopharyngeal lesion is present. The paranasal sinuses
and mastoid air cells are otherwise clear. Bilateral lens
replacements are noted. Globes and orbits are otherwise
unremarkable.
IMPRESSION: 1. No acute intracranial abnormality.
2. Stable remote encephalomalacia involving the left parietal and
occipital lobe.
3. Additional remote infarcts involve the right occipital lobe, left
cerebellum, and anterior left frontal lobe.
4. Stable advanced atrophy and white matter disease.
5. Right sphenoid sinusitis.

## 2019-10-13 ENCOUNTER — Encounter (HOSPITAL_COMMUNITY): Payer: Self-pay

## 2019-10-13 ENCOUNTER — Emergency Department (HOSPITAL_COMMUNITY)
Admission: EM | Admit: 2019-10-13 | Discharge: 2019-10-13 | Disposition: A | Discharge status: Home / Self Care | Payer: Medicare Other | Attending: Emergency Medicine | Admitting: Emergency Medicine

## 2019-10-13 ENCOUNTER — Emergency Department (HOSPITAL_COMMUNITY): Payer: Medicare Other

## 2019-10-13 DIAGNOSIS — I10 Essential (primary) hypertension: Secondary | ICD-10-CM | POA: Insufficient documentation

## 2019-10-13 DIAGNOSIS — R0602 Shortness of breath: Secondary | ICD-10-CM | POA: Insufficient documentation

## 2019-10-13 DIAGNOSIS — Z7982 Long term (current) use of aspirin: Secondary | ICD-10-CM | POA: Insufficient documentation

## 2019-10-13 DIAGNOSIS — Z9049 Acquired absence of other specified parts of digestive tract: Secondary | ICD-10-CM | POA: Insufficient documentation

## 2019-10-13 DIAGNOSIS — E039 Hypothyroidism, unspecified: Secondary | ICD-10-CM | POA: Diagnosis not present

## 2019-10-13 DIAGNOSIS — Z89512 Acquired absence of left leg below knee: Secondary | ICD-10-CM | POA: Insufficient documentation

## 2019-10-13 DIAGNOSIS — Z20822 Contact with and (suspected) exposure to covid-19: Secondary | ICD-10-CM | POA: Diagnosis not present

## 2019-10-13 DIAGNOSIS — Z79899 Other long term (current) drug therapy: Secondary | ICD-10-CM | POA: Diagnosis not present

## 2019-10-13 DIAGNOSIS — J441 Chronic obstructive pulmonary disease with (acute) exacerbation: Secondary | ICD-10-CM

## 2019-10-13 LAB — CBC WITH DIFFERENTIAL/PLATELET
Abs Immature Granulocytes: 0.02 10*3/uL (ref 0.00–0.07)
Basophils Absolute: 0 10*3/uL (ref 0.0–0.1)
Basophils Relative: 0 %
Eosinophils Absolute: 0.2 10*3/uL (ref 0.0–0.5)
Eosinophils Relative: 4 %
HCT: 37.7 % (ref 36.0–46.0)
Hemoglobin: 11.7 g/dL — ABNORMAL LOW (ref 12.0–15.0)
Immature Granulocytes: 0 %
Lymphocytes Relative: 16 %
Lymphs Abs: 0.8 10*3/uL (ref 0.7–4.0)
MCH: 29.5 pg (ref 26.0–34.0)
MCHC: 31 g/dL (ref 30.0–36.0)
MCV: 95 fL (ref 80.0–100.0)
Monocytes Absolute: 0.3 10*3/uL (ref 0.1–1.0)
Monocytes Relative: 6 %
Neutro Abs: 3.8 10*3/uL (ref 1.7–7.7)
Neutrophils Relative %: 74 %
Platelets: 198 10*3/uL (ref 150–400)
RBC: 3.97 MIL/uL (ref 3.87–5.11)
RDW: 14.3 % (ref 11.5–15.5)
WBC: 5.1 10*3/uL (ref 4.0–10.5)
nRBC: 0 % (ref 0.0–0.2)

## 2019-10-13 LAB — BASIC METABOLIC PANEL
Anion gap: 9 (ref 5–15)
BUN: 14 mg/dL (ref 8–23)
CO2: 30 mmol/L (ref 22–32)
Calcium: 8.7 mg/dL — ABNORMAL LOW (ref 8.9–10.3)
Chloride: 99 mmol/L (ref 98–111)
Creatinine, Ser: 0.67 mg/dL (ref 0.44–1.00)
GFR calc Af Amer: >60 mL/min (ref >60)
GFR calc non Af Amer: >60 mL/min (ref >60)
Glucose, Bld: 101 mg/dL — ABNORMAL HIGH (ref 70–99)
Potassium: 4.6 mmol/L (ref 3.5–5.1)
Sodium: 138 mmol/L (ref 135–145)

## 2019-10-13 LAB — RESPIRATORY PANEL BY RT PCR (FLU A&B, COVID)
Influenza A by PCR: NEGATIVE
Influenza B by PCR: NEGATIVE
SARS Coronavirus 2 by RT PCR: NEGATIVE

## 2019-10-13 MED ORDER — PREDNISONE 10 MG PO TABS
20.0000 mg | ORAL_TABLET | Freq: Every day | ORAL | 0 refills | Status: DC
Start: 2019-10-13 — End: 2020-01-13

## 2019-10-13 MED ORDER — METHYLPREDNISOLONE SODIUM SUCC 125 MG IJ SOLR
125.0000 mg | Freq: Once | INTRAMUSCULAR | Status: AC
  Administered 2019-10-13: 125 mg via INTRAVENOUS
  Filled 2019-10-13: qty 2

## 2019-10-13 MED ORDER — ALBUTEROL SULFATE HFA 108 (90 BASE) MCG/ACT IN AERS: 2.0000 | INHALATION_SPRAY | RESPIRATORY_TRACT | Status: DC | PRN

## 2019-10-13 MED ORDER — ALBUTEROL SULFATE HFA 108 (90 BASE) MCG/ACT IN AERS
2.0000 | INHALATION_SPRAY | RESPIRATORY_TRACT | Status: DC | PRN
  Administered 2019-10-13: 09:00:00 2 via RESPIRATORY_TRACT
  Filled 2019-10-13: qty 6.7

## 2019-10-13 NOTE — ED Notes (Signed)
Report attempted to [facility], this RN was put on hold and unable to speak to staff to give report. [facility] bedside and transporting patient.

## 2019-10-13 NOTE — ED Notes (Signed)
Pure wick has been placed. Suction set to 45mmHg.  

## 2019-10-13 NOTE — Discharge Instructions (Addendum)
Use your albuterol inhaler every 4-6 hours as needed and follow-up with your PCP next week

## 2019-10-13 NOTE — ED Notes (Signed)
Followed up on [facility] transport. Confirmed paperwork in printed at nurses station.

## 2019-10-13 NOTE — ED Provider Notes (Signed)
[facility]-EMERGENCY DEPT Provider Note   CSN: [id number] Arrival date & time: 10/13/19  7619     History Chief Complaint  Patient presents with  . Shortness of Breath    Whitney Steele is a 74 y.o. female.  Patient complains of shortness of breath.  Patient has COPD and is on 3 L normally  The history is provided by the patient and medical records. No language interpreter was used.  Shortness of Breath Severity:  Moderate Onset quality:  Sudden Timing:  Constant Progression:  Waxing and waning Chronicity:  New Context: activity   Relieved by:  Nothing Worsened by:  Nothing Ineffective treatments:  None tried Associated symptoms: no abdominal pain, no chest pain, no cough, no headaches and no rash   Risk factors: no recent alcohol use        Past Medical History:  Diagnosis Date  . Abnormal AST and ALT   . Allergic rhinitis   . Anemia   . Benign essential hypertension   . 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 COPD (chronic obstructive pulmonary disease) (HCC)   . Depression with anxiety   . Dysphagia   . Foley catheter in place   . GERD (gastroesophageal reflux disease)   . Hypercholesterolemia   . Hypertension   . Hypothyroidism   . Insomnia   . Osteoporosis   . Rheumatoid arthritis [facility])     Patient Active Problem List   Diagnosis Date Noted  . Confusion   . Unilateral AKA, left (HCC)   . Sacral ulcer (HCC) 10/06/2018  . Cellulitis of left lower extremity   . Altered mental status 10/05/2018    Past Surgical History:  Procedure Laterality Date  . ABDOMINAL HYSTERECTOMY    . AMPUTATION Left 10/09/2018   Procedure: LEFT ABOVE KNEE AMPUTATION;  Surgeon: Nadara Mustard, MD;  Location: [facility] OR;  Service: Orthopedics;  Laterality: Left;  . 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 CARPAL TUNNEL RELEASE    . ESOPHAGOGASTRODUODENOSCOPY  09/12/2016   Distal esophageal Schatzkis ring. Moderate  hh. Highly tourturous esophagus. Mild esophageal diverticulum. Superficial gastric ulcer. Esophagus Bx: benign squamoglandular mucosa with chronic inflammation and squamous hyperplasia suggestive of gastroesophageal reflux disease. Stomach Bx: Ulceration and reactive gastropthy.  . ESOPHAGOGASTRODUODENOSCOPY  09/12/2016   Distal esophageal Schatzkis ring status post esophageal dilitation. Moderate hiatal hernia. Highly torturous esophagus suggestive of esophageal dysmotility/presbyesophagus. Mid esophageal diverticulum. Superficial gastric ulcer.   . INCISIONAL HERNIA REPAIR    . LAPAROSCOPIC CHOLECYSTECTOMY    . OVARY SURGERY     removal of ovarian masses     OB History   No obstetric history on file.     Family History  Family history unknown: Yes    Social History   Tobacco Use  . Smoking status: Never Smoker  . Smokeless tobacco: Never Used  Substance Use Topics  . Alcohol use: Not Currently  . Drug use: Never    Home Medications Prior to Admission medications   Medication Sig Start Date End Date Taking? Authorizing Provider  Abatacept (ORENCIA) 125 MG/ML SOSY Inject 125 mg into the skin every Thursday.   Yes [provider]  acetaminophen (TYLENOL) 325 MG tablet Take 650 mg by mouth every 6 (six) hours as needed for mild pain or fever.   Yes [provider]  albuterol (VENTOLIN HFA) 108 (90 Base) MCG/ACT inhaler Inhale 2 puffs into the lungs every 6 (  six) hours as needed for wheezing or shortness of breath.   Yes [provider]  ALPRAZolam (XANAX) 0.25 MG tablet Take 0.25 mg by mouth 2 (two) times daily.  09/17/19  Yes [provider]  aspirin EC 81 MG tablet Take 81 mg by mouth daily.    Yes [provider]  atorvastatin (LIPITOR) 20 MG tablet Take 20 mg by mouth at bedtime.    Yes [provider]  celecoxib (CELEBREX) 100 MG capsule Take 100 mg by mouth every 12 (twelve) hours as needed for pain. 08/11/19  Yes [provider]  Cholecalciferol (VITAMIN D3) 125 MCG (5000 UT) CAPS Take 5,000 Units by mouth daily.   Yes [provider]  diltiazem (CARDIZEM) 30 MG tablet Take 30 mg by mouth 3 (three) times daily.    Yes [provider]  docusate sodium (COLACE) 100 MG capsule Take 100 mg by mouth 2 (two) times daily.   Yes [provider]  DULoxetine (CYMBALTA) 60 MG capsule Take 60 mg by mouth daily.   Yes [provider]  feeding supplement, ENSURE ENLIVE, (ENSURE ENLIVE) LIQD Take 237 mLs by mouth 3 (three) times daily with meals. 10/14/18  Yes Seawell, Jaimie A, DO  ferrous sulfate 325 (65 FE) MG tablet Take 325 mg by mouth daily with breakfast.   Yes [provider]  folic acid (FOLVITE) 1 MG tablet Take 1 tablet (1 mg total) by mouth daily. 10/15/18  Yes Seawell, Jaimie A, DO  gabapentin (NEURONTIN) 100 MG capsule Take 1 capsule (100 mg total) by mouth 2 (two) times daily. 10/21/18  Yes Adonis Huguenin, NP  geriatric multivitamins-minerals (ELDERTONIC/GEVRABON) LIQD Take 15 mLs by mouth daily.   Yes [provider]  Ipratropium-Albuterol (COMBIVENT RESPIMAT) 20-100 MCG/ACT AERS respimat Inhale 1 puff into the lungs every 6 (six) hours as needed for wheezing or shortness of breath.   Yes [provider]  isosorbide dinitrate (ISORDIL) 30 MG tablet Take 30 mg by mouth 2 (two) times daily. 09/24/19  Yes [provider]  levETIRAcetam (KEPPRA) 500 MG tablet Take 500 mg by mouth 2 (two) times daily.   Yes [provider]  loratadine (CLARITIN) 10 MG tablet Take 10 mg by mouth daily.   Yes [provider]  melatonin 5 MG TABS Take 5 mg by mouth at bedtime.   Yes [provider]  Multiple Vitamin (MULTIVITAMIN WITH MINERALS) TABS tablet Take 1 tablet by mouth daily.   Yes [provider]  omeprazole (PRILOSEC) 40 MG capsule Take 1 capsule (40 mg total) by mouth 2 (two) times daily. 01/18/19  Yes Lynann Bologna, MD    oxyCODONE-acetaminophen (PERCOCET/ROXICET) 5-325 MG tablet Take 1 tablet by mouth every 6 (six) hours as needed for moderate pain. Patient taking differently: Take 1 tablet by mouth every 8 (eight) hours as needed for moderate pain.  10/14/18  Yes Santos-Sanchez, Chelsea Primus, MD  OXYGEN Inhale 2 L into the lungs continuous.   Yes [provider]  polyethylene glycol powder (GLYCOLAX/MIRALAX) powder Take 17 g by mouth daily. 11/27/17  Yes Lynann Bologna, MD  ranolazine (RANEXA) 500 MG 12 hr tablet Take 500 mg by mouth every 12 (twelve) hours.    Yes [provider]  thiamine 100 MG tablet Take 1 tablet (100 mg total) by mouth daily. 10/15/18  Yes Seawell, Jaimie A, DO  vitamin C (ASCORBIC ACID) 500 MG tablet Take 500 mg by mouth 2 (two) times daily.   Yes [provider]  ALPRAZolam (XANAX) 0.5 MG tablet Take 1 tablet (0.5 mg total) by mouth 2 (two) times daily. Patient not taking: Reported on 10/13/2019 10/14/18   Isabelle Course, MD  ferrous sulfate 300 (60 Fe) MG/5ML syrup Take 5 mLs (300 mg total) by mouth daily with breakfast. Patient not taking: Reported on 10/13/2019 10/15/18   Molli Hazard A, DO    Allergies    Iodinated diagnostic agents and Tape  Review of Systems   Review of Systems  Constitutional: Negative for appetite change and fatigue.  HENT: Negative for congestion, ear discharge and sinus pressure.   Eyes: Negative for discharge.  Respiratory: Positive for shortness of breath. Negative for cough.   Cardiovascular: Negative for chest pain.  Gastrointestinal: Negative for abdominal pain and diarrhea.  Genitourinary: Negative for frequency and hematuria.  Musculoskeletal: Negative for back pain.  Skin: Negative for rash.  Neurological: Negative for seizures and headaches.  Psychiatric/Behavioral: Negative for hallucinations.    Physical Exam Updated Vital Signs BP (!) 156/96   Pulse 89   Temp 98.3 F (36.8 C) (Oral)   Resp 15   SpO2 93%   Physical  Exam Vitals and nursing note reviewed.  Constitutional:      Appearance: She is well-developed.  HENT:     Head: Normocephalic.     Nose: Nose normal.  Eyes:     General: No scleral icterus.    Conjunctiva/sclera: Conjunctivae normal.  Neck:     Thyroid: No thyromegaly.  Cardiovascular:     Rate and Rhythm: Normal rate and regular rhythm.     Heart sounds: No murmur. No friction rub. No gallop.   Pulmonary:     Breath sounds: No stridor. Wheezing present. No rales.  Chest:     Chest wall: No tenderness.  Abdominal:     General: There is no distension.     Tenderness: There is no abdominal tenderness. There is no rebound.  Musculoskeletal:        General: Normal range of motion.     Cervical back: Neck supple.  Lymphadenopathy:     Cervical: No cervical adenopathy.  Skin:    Findings: No erythema or rash.  Neurological:     Mental Status: She is alert and oriented to person, place, and time.     Motor: No abnormal muscle tone.     Coordination: Coordination normal.  Psychiatric:        Behavior: Behavior normal.     ED Results / Procedures / Treatments   Labs (all labs ordered are listed, but only abnormal results are displayed) Labs Reviewed  CBC WITH DIFFERENTIAL/PLATELET - Abnormal; Notable for the following components:      Result Value   Hemoglobin 11.7 (*)    All other components within normal limits  BASIC METABOLIC PANEL - Abnormal; Notable for the following components:   Glucose, Bld 101 (*)    Calcium 8.7 (*)    All other components within normal limits  RESPIRATORY PANEL BY RT PCR (FLU A&B, COVID)    EKG None  Radiology DG Chest Port 1 View  Result Date: 10/13/2019 CLINICAL DATA:  Shortness of breath, decreased oxygen saturation, COPD EXAM: PORTABLE CHEST 1 VIEW COMPARISON:  Portable exam 0856 hours compared to 10/05/2018 FINDINGS: Minimal rotation to the RIGHT.  Normal heart size. Slightly prominent hila unchanged question prominent central  pulmonary arteries. Mediastinal contours and pulmonary vascularity otherwise normal. Emphysematous and bronchitic changes consistent with COPD. Bibasilar chronic interstitial lung disease changes  and RIGHT apex scarring. No acute infiltrate, pleural effusion or pneumothorax. IMPRESSION: Changes of COPD and basilar chronic interstitial lung disease. Question pulmonary arterial hypertension. No acute abnormalities. Electronically Signed   By: Ulyses Southward M.D.   On: 10/13/2019 09:29    Procedures Procedures (including critical care time)  Medications Ordered in ED Medications  albuterol (VENTOLIN HFA) 108 (90 Base) MCG/ACT inhaler 2 puff (2 puffs Inhalation Given 10/13/19 0850)  albuterol (VENTOLIN HFA) 108 (90 Base) MCG/ACT inhaler 2 puff (has no administration in time range)  methylPREDNISolone sodium succinate (SOLU-MEDROL) 125 mg/2 mL injection 125 mg (125 mg Intravenous Given 10/13/19 0850)    ED Course  I have reviewed the triage vital signs and the nursing notes.  Pertinent labs & imaging results that were available during my care of the patient were reviewed by me and considered in my medical decision making (see chart for details).    MDM Rules/Calculators/A&P      Labs and x-rays unremarkable.  Patient improved with albuterol and steroids.  She will be sent home with a dose of steroids and follow-up with PCP     This patient presents to the ED for concern of shortness of breath, this involves an extensive number of treatment options, and is a complaint that carries with it a high risk of complications and morbidity.  The differential diagnosis includes pneumonia COPD exacerbation   Lab Tests:   I Ordered, reviewed, and interpreted labs, which included CBC chemistries with mild anemia  Medicines ordered:   I ordered medication albuterol and steroids for shortness of breath  Imaging Studies ordered:   I ordered imaging studies which included chest x-ray and  I  independently visualized and interpreted imaging which showed COPD  Additional history obtained:   Additional history obtained from records  Previous records obtained and reviewed  Consultations Obtained:   Reevaluation:  After the interventions stated above, I reevaluated the patient and found improved  Critical Interventions:  .                   Final Clinical Impression(s) / ED Diagnoses Final diagnoses:  SOB (shortness of breath)    Rx / DC Orders ED Discharge Orders    None       Bethann Berkshire, MD 10/13/19 1217

## 2019-10-13 NOTE — ED Notes (Signed)
Pt repositioned in bed to upright position. Patient reports shob. Patient 92% on home 02 @ 3 liters. EDP made aware.

## 2019-10-13 NOTE — ED Triage Notes (Signed)
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

## 2019-11-02 ENCOUNTER — Other Ambulatory Visit: Payer: Self-pay

## 2019-11-02 ENCOUNTER — Emergency Department (HOSPITAL_COMMUNITY): Payer: Medicare Other

## 2019-11-02 ENCOUNTER — Emergency Department (HOSPITAL_COMMUNITY)
Admission: EM | Admit: 2019-11-02 | Discharge: 2019-11-02 | Disposition: A | Discharge status: Home / Self Care | Payer: Medicare Other | Attending: Emergency Medicine | Admitting: Emergency Medicine

## 2019-11-02 DIAGNOSIS — Z89612 Acquired absence of left leg above knee: Secondary | ICD-10-CM | POA: Diagnosis not present

## 2019-11-02 DIAGNOSIS — I10 Essential (primary) hypertension: Secondary | ICD-10-CM | POA: Insufficient documentation

## 2019-11-02 DIAGNOSIS — S0083XA Contusion of other part of head, initial encounter: Secondary | ICD-10-CM | POA: Diagnosis not present

## 2019-11-02 DIAGNOSIS — E039 Hypothyroidism, unspecified: Secondary | ICD-10-CM | POA: Insufficient documentation

## 2019-11-02 DIAGNOSIS — W06XXXA Fall from bed, initial encounter: Secondary | ICD-10-CM | POA: Diagnosis not present

## 2019-11-02 DIAGNOSIS — Y999 Unspecified external cause status: Secondary | ICD-10-CM | POA: Insufficient documentation

## 2019-11-02 DIAGNOSIS — S61402A Unspecified open wound of left hand, initial encounter: Secondary | ICD-10-CM | POA: Diagnosis not present

## 2019-11-02 DIAGNOSIS — S61401A Unspecified open wound of right hand, initial encounter: Secondary | ICD-10-CM | POA: Insufficient documentation

## 2019-11-02 DIAGNOSIS — Y92122 Bedroom in nursing home as the place of occurrence of the external cause: Secondary | ICD-10-CM | POA: Insufficient documentation

## 2019-11-02 DIAGNOSIS — Y939 Activity, unspecified: Secondary | ICD-10-CM | POA: Insufficient documentation

## 2019-11-02 DIAGNOSIS — Z7982 Long term (current) use of aspirin: Secondary | ICD-10-CM | POA: Diagnosis not present

## 2019-11-02 DIAGNOSIS — M069 Rheumatoid arthritis, unspecified: Secondary | ICD-10-CM | POA: Diagnosis not present

## 2019-11-02 DIAGNOSIS — Z993 Dependence on wheelchair: Secondary | ICD-10-CM | POA: Insufficient documentation

## 2019-11-02 DIAGNOSIS — Z79899 Other long term (current) drug therapy: Secondary | ICD-10-CM | POA: Insufficient documentation

## 2019-11-02 DIAGNOSIS — T07XXXA Unspecified multiple injuries, initial encounter: Secondary | ICD-10-CM

## 2019-11-02 DIAGNOSIS — J449 Chronic obstructive pulmonary disease, unspecified: Secondary | ICD-10-CM | POA: Insufficient documentation

## 2019-11-02 DIAGNOSIS — W19XXXA Unspecified fall, initial encounter: Secondary | ICD-10-CM

## 2019-11-02 MED ORDER — OXYCODONE-ACETAMINOPHEN 5-325 MG PO TABS
1.0000 | ORAL_TABLET | Freq: Once | ORAL | Status: AC
  Administered 2019-11-02: 1 via ORAL
  Filled 2019-11-02: qty 1

## 2019-11-02 NOTE — Discharge Instructions (Addendum)
You have been evaluated for your fall.  You suffered bruising to the left side of your face but fortunately no evidence of broken bones or internal injury. Take your regular pain medications.  Follow up with your doctor for further care.

## 2019-11-02 NOTE — ED Triage Notes (Signed)
Pt from [facility]. Pt fell out of bed this morning, bruising noted to left eye, pt is not on a blood thinner. No other injuries from the fall

## 2019-11-02 NOTE — ED Notes (Signed)
Ptar called for transport 

## 2019-11-02 NOTE — ED Provider Notes (Signed)
[name] EMERGENCY DEPARTMENT Provider Note   CSN: [id number] Arrival date & time: 11/02/19  1158     History Chief Complaint  Patient presents with  . Fall    Whitney Steele is a 74 y.o. female.  The history is provided by the patient and medical records. No language interpreter was used.  Fall     74 year old female with history of osteoporosis, hypertension, depression, anemia, who lives at [facility] brought here via EMS for evaluation of a fall.  Patient report last night she accidentally rolled out of her bed and landed on the ground.  She did report striking her head against the ground but denies any loss of consciousness.  She did try to break the fall with her hands but denies any significant pain to him.  She report chronic back pain which she felt was more painful than the injury that she suffered from a fall.  Pain is 5 out of 10, achy sharp to her lower back.  Pain is not new.  She normally takes Percocet twice daily but last dose was yesterday.  She denies any precipitating symptoms prior to the fall.  She has a left AKA and is wheelchair-bound.  She has been doing fine.  No complaints of chest pain, trouble breathing, abdominal pain, or pain to her lower extremities.  She is not on any blood thinner medication.  Past Medical History:  Diagnosis Date  . Abnormal AST and ALT   . Allergic rhinitis   . Anemia   . Benign essential hypertension   . 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 COPD (chronic obstructive pulmonary disease) (HCC)   . Depression with anxiety   . Dysphagia   . Foley catheter in place   . GERD (gastroesophageal reflux disease)   . Hypercholesterolemia   . Hypertension   . Hypothyroidism   . Insomnia   . Osteoporosis   . Rheumatoid arthritis [facility])     Patient Active Problem List   Diagnosis Date Noted  . Confusion   . Unilateral AKA, left (HCC)   . Sacral ulcer (HCC) 10/06/2018   . Cellulitis of left lower extremity   . Altered mental status 10/05/2018    Past Surgical History:  Procedure Laterality Date  . ABDOMINAL HYSTERECTOMY    . AMPUTATION Left 10/09/2018   Procedure: LEFT ABOVE KNEE AMPUTATION;  Surgeon: Nadara Mustard, MD;  Location: [facility] OR;  Service: Orthopedics;  Laterality: Left;  . 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 CARPAL TUNNEL RELEASE    . ESOPHAGOGASTRODUODENOSCOPY  09/12/2016   Distal esophageal Schatzkis ring. Moderate hh. Highly tourturous esophagus. Mild esophageal diverticulum. Superficial gastric ulcer. Esophagus Bx: benign squamoglandular mucosa with chronic inflammation and squamous hyperplasia suggestive of gastroesophageal reflux disease. Stomach Bx: Ulceration and reactive gastropthy.  . ESOPHAGOGASTRODUODENOSCOPY  09/12/2016   Distal esophageal Schatzkis ring status post esophageal dilitation. Moderate hiatal hernia. Highly torturous esophagus suggestive of esophageal dysmotility/presbyesophagus. Mid esophageal diverticulum. Superficial gastric ulcer.   . INCISIONAL HERNIA REPAIR    . LAPAROSCOPIC CHOLECYSTECTOMY    . OVARY SURGERY     removal of ovarian masses     OB History   No obstetric history on file.     Family History  Family history unknown: Yes    Social History   Tobacco Use  . Smoking status: Never Smoker  . Smokeless tobacco: Never Used  Substance Use Topics  .  Alcohol use: Not Currently  . Drug use: Never    Home Medications Prior to Admission medications   Medication Sig Start Date End Date Taking? Authorizing Provider  Abatacept (ORENCIA) 125 MG/ML SOSY Inject 125 mg into the skin every Thursday.    [provider]  acetaminophen (TYLENOL) 325 MG tablet Take 650 mg by mouth every 6 (six) hours as needed for mild pain or fever.    [provider]  albuterol (VENTOLIN HFA) 108 (90 Base) MCG/ACT inhaler Inhale 2 puffs into the lungs every 6 (six) hours as needed for  wheezing or shortness of breath.    [provider]  ALPRAZolam Duanne Moron) 0.25 MG tablet Take 0.25 mg by mouth 2 (two) times daily.  09/17/19   [provider]  ALPRAZolam Duanne Moron) 0.5 MG tablet Take 1 tablet (0.5 mg total) by mouth 2 (two) times daily. Patient not taking: Reported on 10/13/2019 10/14/18   Isabelle Course, MD  aspirin EC 81 MG tablet Take 81 mg by mouth daily.     [provider]  atorvastatin (LIPITOR) 20 MG tablet Take 20 mg by mouth at bedtime.     [provider]  celecoxib (CELEBREX) 100 MG capsule Take 100 mg by mouth every 12 (twelve) hours as needed for pain. 08/11/19   [provider]  Cholecalciferol (VITAMIN D3) 125 MCG (5000 UT) CAPS Take 5,000 Units by mouth daily.    [provider]  diltiazem (CARDIZEM) 30 MG tablet Take 30 mg by mouth 3 (three) times daily.     [provider]  docusate sodium (COLACE) 100 MG capsule Take 100 mg by mouth 2 (two) times daily.    [provider]  DULoxetine (CYMBALTA) 60 MG capsule Take 60 mg by mouth daily.    [provider]  feeding supplement, ENSURE ENLIVE, (ENSURE ENLIVE) LIQD Take 237 mLs by mouth 3 (three) times daily with meals. 10/14/18   Seawell, Jaimie A, DO  ferrous sulfate 300 (60 Fe) MG/5ML syrup Take 5 mLs (300 mg total) by mouth daily with breakfast. Patient not taking: Reported on 10/13/2019 10/15/18   Seawell, Jaimie A, DO  ferrous sulfate 325 (65 FE) MG tablet Take 325 mg by mouth daily with breakfast.    [provider]  folic acid (FOLVITE) 1 MG tablet Take 1 tablet (1 mg total) by mouth daily. 10/15/18   Seawell, Jaimie A, DO  gabapentin (NEURONTIN) 100 MG capsule Take 1 capsule (100 mg total) by mouth 2 (two) times daily. 10/21/18   Suzan Slick, NP  geriatric multivitamins-minerals (ELDERTONIC/GEVRABON) LIQD Take 15 mLs by mouth daily.    [provider]  Ipratropium-Albuterol (COMBIVENT RESPIMAT) 20-100 MCG/ACT AERS respimat  Inhale 1 puff into the lungs every 6 (six) hours as needed for wheezing or shortness of breath.    [provider]  isosorbide dinitrate (ISORDIL) 30 MG tablet Take 30 mg by mouth 2 (two) times daily. 09/24/19   [provider]  levETIRAcetam (KEPPRA) 500 MG tablet Take 500 mg by mouth 2 (two) times daily.    [provider]  loratadine (CLARITIN) 10 MG tablet Take 10 mg by mouth daily.    [provider]  melatonin 5 MG TABS Take 5 mg by mouth at bedtime.    [provider]  Multiple Vitamin (MULTIVITAMIN WITH MINERALS) TABS tablet Take 1 tablet by mouth daily.    [provider]  omeprazole (PRILOSEC) 40 MG capsule Take 1 capsule (40 mg total)  by mouth 2 (two) times daily. 01/18/19   Lynann Bologna, MD  oxyCODONE-acetaminophen (PERCOCET/ROXICET) 5-325 MG tablet Take 1 tablet by mouth every 6 (six) hours as needed for moderate pain. Patient taking differently: Take 1 tablet by mouth every 8 (eight) hours as needed for moderate pain.  10/14/18   Burna Cash, MD  OXYGEN Inhale 2 L into the lungs continuous.    [provider]  polyethylene glycol powder (GLYCOLAX/MIRALAX) powder Take 17 g by mouth daily. 11/27/17   Lynann Bologna, MD  predniSONE (DELTASONE) 10 MG tablet Take 2 tablets (20 mg total) by mouth daily. 10/13/19   Bethann Berkshire, MD  ranolazine (RANEXA) 500 MG 12 hr tablet Take 500 mg by mouth every 12 (twelve) hours.     [provider]  thiamine 100 MG tablet Take 1 tablet (100 mg total) by mouth daily. 10/15/18   Seawell, Jaimie A, DO  vitamin C (ASCORBIC ACID) 500 MG tablet Take 500 mg by mouth 2 (two) times daily.    [provider]    Allergies    Iodinated diagnostic agents and Tape  Review of Systems   Review of Systems  All other systems reviewed and are negative.   Physical Exam Updated Vital Signs BP 115/71   Pulse 78   Temp 98 F (36.7 C) (Oral)   Resp 18   Ht 5\' 6"  (1.676 m)   Wt  54.4 kg   SpO2 100%   BMI 19.37 kg/m   Physical Exam Vitals and nursing note reviewed.  Constitutional:      General: She is not in acute distress.    Appearance: She is well-developed.     Comments: Elderly female, sitting up in bed appears to be in no acute discomfort.   HENT:     Head: Normocephalic.     Comments: Hematoma noted to L orbital rim without eye involvement.  It is mildly tender to palpation, no crepitus.  No midface tenderness, no hemotympanum, septal hematoma or malocclusion.   Eyes:     Conjunctiva/sclera: Conjunctivae normal.  Cardiovascular:     Rate and Rhythm: Normal rate and regular rhythm.     Pulses: Normal pulses.     Heart sounds: Normal heart sounds.  Pulmonary:     Effort: Pulmonary effort is normal.     Breath sounds: Normal breath sounds.  Abdominal:     Palpations: Abdomen is soft.  Musculoskeletal:        General: No tenderness.     Cervical back: Normal range of motion and neck supple. No tenderness.     Comments: No significant midline spine tenderness  Small skin tears noted to dorsum of MCPs on both hands, minimal tenderness to palpation.    L AKA.  Skin:    Findings: No rash.  Neurological:     Mental Status: She is alert. Mental status is at baseline.  Psychiatric:        Mood and Affect: Mood normal.     ED Results / Procedures / Treatments   Labs (all labs ordered are listed, but only abnormal results are displayed) Labs Reviewed - No data to display  EKG None  Radiology CT Head Wo Contrast  Result Date: 11/02/2019 CLINICAL DATA:  Recent fall with facial pain and swelling, initial encounter EXAM: CT HEAD WITHOUT CONTRAST CT MAXILLOFACIAL WITHOUT CONTRAST TECHNIQUE: Multidetector CT imaging of the head and maxillofacial structures were performed using the standard protocol without intravenous contrast. Multiplanar CT image reconstructions of the  maxillofacial structures were also generated. COMPARISON:  10/05/2018 FINDINGS:  CT HEAD FINDINGS Brain: Mild atrophic changes and chronic white matter ischemic changes are seen. 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 No acute hemorrhage or acute infarct is seen. Vascular: No hyperdense vessel or unexpected calcification. Skull: Normal. Negative for fracture or focal lesion. Other: None. CT MAXILLOFACIAL FINDINGS Osseous: No acute fracture is noted. Multiple dental caries are noted. Orbits: Orbits and their contents are within normal limits. Sinuses: Paranasal sinuses are within normal limits. Soft tissues: Mild soft tissue swelling is noted over the lateral left orbital ridge. No sizable hematoma is noted. IMPRESSION: CT of the head: Chronic atrophic and ischemic changes without acute abnormality. CT of the maxillofacial bones: No acute fracture is noted. Mild soft tissue swelling adjacent to the left orbit. Multiple dental caries. Electronically Signed   By: Alcide Clever M.D.   On: 11/02/2019 13:42   CT Maxillofacial Wo Contrast  Result Date: 11/02/2019 CLINICAL DATA:  Left tear oval bruising after falling out of bed this morning. EXAM: CT MAXILLOFACIAL WITHOUT CONTRAST TECHNIQUE: Multidetector CT imaging of the maxillofacial structures was performed. Multiplanar CT image reconstructions were also generated. COMPARISON:  Head CT obtained at the same time. FINDINGS: Osseous: With edematous ED patient Orbits: Status post bilateral cataract extraction. Otherwise, unremarkable. Sinuses: Right sphenoid sinus retention cyst with a small amount of calcification. Soft tissues: Unremarkable. Limited intracranial: No significant or unexpected finding. IMPRESSION: No maxillofacial fracture. Electronically Signed   By: Beckie Salts M.D.   On: 11/02/2019 13:41    Procedures Procedures (including critical care time)  Medications Ordered in ED Medications  oxyCODONE-acetaminophen (PERCOCET/ROXICET) 5-325 MG per tablet 1  tablet (1 tablet Oral Given 11/02/19 1424)    ED Course  I have reviewed the triage vital signs and the nursing notes.  Pertinent labs & imaging results that were available during my care of the patient were reviewed by me and considered in my medical decision making (see chart for details).    MDM Rules/Calculators/A&P                      BP 115/71   Pulse 78   Temp 98 F (36.7 C) (Oral)   Resp 18   Ht 5\' 6"  (1.676 m)   Wt 54.4 kg   SpO2 100%   BMI 19.37 kg/m   Final Clinical Impression(s) / ED Diagnoses Final diagnoses:  Contusion of face, initial encounter  Fall at nursing home, initial encounter  Multiple abrasions    Rx / DC Orders ED Discharge Orders    None     2:53 PM Pt report fallen off her bed this AM while sleeping.  No LOC.  Has hematoma to L orbital rim and mild skin tears to dorsum of hands.  CT of head and maxillofacial are unremarkable.  Pt mentating at baseline.  Care discussed with Dr. .  Plan to discharge pt.  I did gave her a dose of percocet for her chronic back pain.  I did attempt to contact family member to give an update, but without success. Will d/c back to her nursing facility.    Juleen China, PA-C 11/02/19 1457    11/04/19, MD 11/03/19 [phone]

## 2019-12-22 ENCOUNTER — Ambulatory Visit: Payer: Medicare Other | Admitting: Gastroenterology

## 2020-01-13 ENCOUNTER — Other Ambulatory Visit: Payer: Self-pay

## 2020-01-13 ENCOUNTER — Encounter: Payer: Self-pay | Admitting: Gastroenterology

## 2020-01-13 ENCOUNTER — Ambulatory Visit (INDEPENDENT_AMBULATORY_CARE_PROVIDER_SITE_OTHER): Payer: Medicare Other | Admitting: Gastroenterology

## 2020-01-13 VITALS — BP 128/84 | HR 107 | Ht 66.0 in | Wt 112.0 lb

## 2020-01-13 DIAGNOSIS — R1310 Dysphagia, unspecified: Secondary | ICD-10-CM | POA: Diagnosis not present

## 2020-01-13 DIAGNOSIS — K219 Gastro-esophageal reflux disease without esophagitis: Secondary | ICD-10-CM | POA: Diagnosis not present

## 2020-01-13 NOTE — Patient Instructions (Signed)
If you are age 73 or older, your body mass index should be between 23-30. Your Body mass index is 18.08 kg/m. If this is out of the aforementioned range listed, please consider follow up with your Primary Care Provider.  If you are age 52 or younger, your body mass index should be between 19-25. Your Body mass index is 18.08 kg/m. If this is out of the aformentioned range listed, please consider follow up with your Primary Care Provider.   We will contact you regarding a date at [facility] for Endoscopy with Dilation when a date becomes available.  You have been placed on a waiting list.  You will need to hold Ranexa three days prior to your procedure.  Thank you,  Dr. Lynann Bologna

## 2020-01-13 NOTE — Progress Notes (Signed)
Chief Complaint: FU  Referring Provider:  Blane Ohara, MD      ASSESSMENT AND PLAN;   #1. GERD with HH with dysphagia s/p dilatation 2018.  -Continue omeprazole 40mg  po bid. Can open the capsules and give it in applesauce.  Do not chew. -EGD with dil at [facility] (patient on oxygen and with multiple comorbid conditions). -I have instructed patient that she needs to chew food especially meats and breads well and eat slowly.  #2. Chronic constipation with H/O diverticulitis (RH- CT- cipro/flagyl) -Continue miralax 17g po qd -Add colace 1 tab po qd -Minimize pain medications.   HPI:    Whitney Steele is a 74 y.o. female  For FU visit NH resident  PMH of COPD on O2, RA, HTN, hypothyroidism, anxiety/depression on chronic pain meds who is s/p left leg amputation. Wheelchair bound.  With  dysphagia, mostly to solids, stable over the last 6 months. Getting chopped food. Tolerating well  She did well after previous EGD with eso dil 09/12/2016 which showed Schatzki's ring s/p dil to 50 Fr Maloney, 5 cm HH, mid esophageal diverticulum and a superficial GU with neg bx.  Patient also had a highly tortuous esophagus s/o eso dysmotility/presbyesophagus.  This was confirmed on barium swallow.  She was recently seen in the emergency room at [facility] with LLQ pain, CT AP- acute diverticulitis.  Treated with Cipro and Flagyl with good results.  She refused screening colonoscopy several times in the past and continues to do so.  H/O chronic constipation d/t pain meds.  Better with MiraLAX and Colace.  Past Medical History:  Diagnosis Date  . Abnormal AST and ALT   . Allergic rhinitis   . Anemia   . Benign essential hypertension   . 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 COPD (chronic obstructive pulmonary disease) (HCC)   . Depression with anxiety   . Dysphagia   . Foley catheter in place   . GERD (gastroesophageal reflux disease)   .  Hypercholesterolemia   . Hypertension   . Hypothyroidism   . Insomnia   . Osteoporosis   . Rheumatoid arthritis [facility])     Past Surgical History:  Procedure Laterality Date  . ABDOMINAL HYSTERECTOMY    . AMPUTATION Left 10/09/2018   Procedure: LEFT ABOVE KNEE AMPUTATION;  Surgeon: Nadara Mustard, MD;  Location: [facility] OR;  Service: Orthopedics;  Laterality: Left;  . 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 CARPAL TUNNEL RELEASE    . ESOPHAGOGASTRODUODENOSCOPY  09/12/2016   Distal esophageal Schatzkis ring. Moderate hh. Highly tourturous esophagus. Mild esophageal diverticulum. Superficial gastric ulcer. Esophagus Bx: benign squamoglandular mucosa with chronic inflammation and squamous hyperplasia suggestive of gastroesophageal reflux disease. Stomach Bx: Ulceration and reactive gastropthy.  . ESOPHAGOGASTRODUODENOSCOPY  09/12/2016   Distal esophageal Schatzkis ring status post esophageal dilitation. Moderate hiatal hernia. Highly torturous esophagus suggestive of esophageal dysmotility/presbyesophagus. Mid esophageal diverticulum. Superficial gastric ulcer.   . INCISIONAL HERNIA REPAIR    . LAPAROSCOPIC CHOLECYSTECTOMY    . OVARY SURGERY     removal of ovarian masses    Family History  Family history unknown: Yes    Social History   Tobacco Use  . Smoking status: Never Smoker  . Smokeless tobacco: Never Used  Vaping Use  . Vaping Use: Never used  Substance Use Topics  . Alcohol use: Not Currently  . Drug use: Never    Current Outpatient Medications  Medication Sig Dispense Refill  . Abatacept (ORENCIA) 125 MG/ML SOSY Inject 125 mg into the skin every Thursday.    Marland Kitchen acetaminophen (TYLENOL) 325 MG tablet Take 650 mg by mouth every 6 (six) hours as needed for mild pain or fever.    Marland Kitchen albuterol (VENTOLIN HFA) 108 (90 Base) MCG/ACT inhaler Inhale 2 puffs into the lungs every 6 (six) hours as needed for wheezing or shortness of breath.    . ALPRAZolam (XANAX) 0.25 MG  tablet Take 0.25 mg by mouth 2 (two) times daily.     Marland Kitchen ALPRAZolam (XANAX) 0.5 MG tablet Take 1 tablet (0.5 mg total) by mouth 2 (two) times daily. (Patient not taking: Reported on 10/13/2019) 30 tablet 0  . aspirin EC 81 MG tablet Take 81 mg by mouth daily.     Marland Kitchen atorvastatin (LIPITOR) 20 MG tablet Take 20 mg by mouth at bedtime.     . celecoxib (CELEBREX) 100 MG capsule Take 100 mg by mouth every 12 (twelve) hours as needed for pain.    . Cholecalciferol (VITAMIN D3) 125 MCG (5000 UT) CAPS Take 5,000 Units by mouth daily.    Marland Kitchen diltiazem (CARDIZEM) 30 MG tablet Take 30 mg by mouth 3 (three) times daily.     Marland Kitchen docusate sodium (COLACE) 100 MG capsule Take 100 mg by mouth 2 (two) times daily.    . DULoxetine (CYMBALTA) 60 MG capsule Take 60 mg by mouth daily.    . feeding supplement, ENSURE ENLIVE, (ENSURE ENLIVE) LIQD Take 237 mLs by mouth 3 (three) times daily with meals. 237 mL 12  . ferrous sulfate 300 (60 Fe) MG/5ML syrup Take 5 mLs (300 mg total) by mouth daily with breakfast. (Patient not taking: Reported on 10/13/2019) 150 mL 3  . ferrous sulfate 325 (65 FE) MG tablet Take 325 mg by mouth daily with breakfast.    . folic acid (FOLVITE) 1 MG tablet Take 1 tablet (1 mg total) by mouth daily.    Marland Kitchen gabapentin (NEURONTIN) 100 MG capsule Take 1 capsule (100 mg total) by mouth 2 (two) times daily. 60 capsule 2  . geriatric multivitamins-minerals (ELDERTONIC/GEVRABON) LIQD Take 15 mLs by mouth daily.    . Ipratropium-Albuterol (COMBIVENT RESPIMAT) 20-100 MCG/ACT AERS respimat Inhale 1 puff into the lungs every 6 (six) hours as needed for wheezing or shortness of breath.    . isosorbide dinitrate (ISORDIL) 30 MG tablet Take 30 mg by mouth 2 (two) times daily.    Marland Kitchen levETIRAcetam (KEPPRA) 500 MG tablet Take 500 mg by mouth 2 (two) times daily.    Marland Kitchen loratadine (CLARITIN) 10 MG tablet Take 10 mg by mouth daily.    . melatonin 5 MG TABS Take 5 mg by mouth at bedtime.    . Multiple Vitamin (MULTIVITAMIN WITH  MINERALS) TABS tablet Take 1 tablet by mouth daily.    Marland Kitchen omeprazole (PRILOSEC) 40 MG capsule Take 1 capsule (40 mg total) by mouth 2 (two) times daily. 60 capsule 11  . oxyCODONE-acetaminophen (PERCOCET/ROXICET) 5-325 MG tablet Take 1 tablet by mouth every 6 (six) hours as needed for moderate pain. (Patient taking differently: Take 1 tablet by mouth every 8 (eight) hours as needed for moderate pain. ) 30 tablet 0  . OXYGEN Inhale 2 L into the lungs continuous.    . polyethylene glycol powder (GLYCOLAX/MIRALAX) powder Take 17 g by mouth daily. 255 g 3  . predniSONE (DELTASONE) 10 MG tablet Take 2 tablets (20 mg total) by mouth daily. 14 tablet  0  . ranolazine (RANEXA) 500 MG 12 hr tablet Take 500 mg by mouth every 12 (twelve) hours.     . thiamine 100 MG tablet Take 1 tablet (100 mg total) by mouth daily.    . vitamin C (ASCORBIC ACID) 500 MG tablet Take 500 mg by mouth 2 (two) times daily.     No current facility-administered medications for this visit.    Allergies  Allergen Reactions  . Iodinated Diagnostic Agents Hives  . Tape Other (See Comments)    Tears skin - please use paper tape    Review of Systems:  neg  Physical Exam:    BP 128/84   Pulse (!) 107   Ht 5\' 6"  (1.676 m)   Wt 112 lb (50.8 kg)   BMI 18.08 kg/m  Filed Weights   01/13/20 1105  Weight: 112 lb (50.8 kg)  Gen: awake, alert, NAD, on wheelchair HEENT: anicteric, no pallor.  On oxygen. CV: RRR, no mrg Pulm: CTA b/l Abd: soft, NT/ND, +BS throughout    03/14/20, MD 01/13/2020, 11:45 AM  Cc: 03/14/2020, MD

## 2020-01-18 ENCOUNTER — Other Ambulatory Visit: Payer: Self-pay

## 2020-01-18 NOTE — Progress Notes (Signed)
Erroneous encounter

## 2020-01-25 ENCOUNTER — Inpatient Hospital Stay (HOSPITAL_COMMUNITY): Payer: Medicare Other

## 2020-01-25 ENCOUNTER — Other Ambulatory Visit: Payer: Self-pay

## 2020-01-25 ENCOUNTER — Emergency Department (HOSPITAL_COMMUNITY): Payer: Medicare Other

## 2020-01-25 ENCOUNTER — Telehealth: Payer: Self-pay

## 2020-01-25 ENCOUNTER — Inpatient Hospital Stay (HOSPITAL_COMMUNITY)
Admission: EM | Admit: 2020-01-25 | Discharge: 2020-02-03 | DRG: 291 | Disposition: A | Discharge status: Skilled Nursing Facility | Payer: Medicare Other | Source: Skilled Nursing Facility | Attending: Internal Medicine | Admitting: Internal Medicine

## 2020-01-25 ENCOUNTER — Other Ambulatory Visit (HOSPITAL_COMMUNITY): Payer: Medicare Other

## 2020-01-25 ENCOUNTER — Encounter (HOSPITAL_COMMUNITY): Payer: Self-pay | Admitting: Emergency Medicine

## 2020-01-25 DIAGNOSIS — R109 Unspecified abdominal pain: Secondary | ICD-10-CM

## 2020-01-25 DIAGNOSIS — J9601 Acute respiratory failure with hypoxia: Secondary | ICD-10-CM | POA: Diagnosis present

## 2020-01-25 DIAGNOSIS — G40909 Epilepsy, unspecified, not intractable, without status epilepticus: Secondary | ICD-10-CM | POA: Diagnosis present

## 2020-01-25 DIAGNOSIS — L89153 Pressure ulcer of sacral region, stage 3: Secondary | ICD-10-CM | POA: Diagnosis present

## 2020-01-25 DIAGNOSIS — J9622 Acute and chronic respiratory failure with hypercapnia: Secondary | ICD-10-CM | POA: Diagnosis present

## 2020-01-25 DIAGNOSIS — Z20822 Contact with and (suspected) exposure to covid-19: Secondary | ICD-10-CM | POA: Diagnosis present

## 2020-01-25 DIAGNOSIS — Z515 Encounter for palliative care: Secondary | ICD-10-CM | POA: Diagnosis not present

## 2020-01-25 DIAGNOSIS — Z8719 Personal history of other diseases of the digestive system: Secondary | ICD-10-CM

## 2020-01-25 DIAGNOSIS — I362 Nonrheumatic tricuspid (valve) stenosis with insufficiency: Secondary | ICD-10-CM | POA: Diagnosis not present

## 2020-01-25 DIAGNOSIS — J8410 Pulmonary fibrosis, unspecified: Secondary | ICD-10-CM | POA: Diagnosis present

## 2020-01-25 DIAGNOSIS — Z7189 Other specified counseling: Secondary | ICD-10-CM

## 2020-01-25 DIAGNOSIS — R739 Hyperglycemia, unspecified: Secondary | ICD-10-CM | POA: Diagnosis present

## 2020-01-25 DIAGNOSIS — Z9981 Dependence on supplemental oxygen: Secondary | ICD-10-CM

## 2020-01-25 DIAGNOSIS — S78112A Complete traumatic amputation at level between left hip and knee, initial encounter: Secondary | ICD-10-CM

## 2020-01-25 DIAGNOSIS — J439 Emphysema, unspecified: Secondary | ICD-10-CM | POA: Diagnosis present

## 2020-01-25 DIAGNOSIS — I272 Pulmonary hypertension, unspecified: Secondary | ICD-10-CM

## 2020-01-25 DIAGNOSIS — R7989 Other specified abnormal findings of blood chemistry: Secondary | ICD-10-CM | POA: Diagnosis not present

## 2020-01-25 DIAGNOSIS — M810 Age-related osteoporosis without current pathological fracture: Secondary | ICD-10-CM | POA: Diagnosis present

## 2020-01-25 DIAGNOSIS — E039 Hypothyroidism, unspecified: Secondary | ICD-10-CM | POA: Diagnosis present

## 2020-01-25 DIAGNOSIS — D689 Coagulation defect, unspecified: Secondary | ICD-10-CM | POA: Diagnosis not present

## 2020-01-25 DIAGNOSIS — I110 Hypertensive heart disease with heart failure: Secondary | ICD-10-CM | POA: Diagnosis present

## 2020-01-25 DIAGNOSIS — I5082 Biventricular heart failure: Secondary | ICD-10-CM | POA: Diagnosis present

## 2020-01-25 DIAGNOSIS — N179 Acute kidney failure, unspecified: Secondary | ICD-10-CM | POA: Diagnosis present

## 2020-01-25 DIAGNOSIS — Z89612 Acquired absence of left leg above knee: Secondary | ICD-10-CM

## 2020-01-25 DIAGNOSIS — Z8739 Personal history of other diseases of the musculoskeletal system and connective tissue: Secondary | ICD-10-CM | POA: Diagnosis not present

## 2020-01-25 DIAGNOSIS — I70202 Unspecified atherosclerosis of native arteries of extremities, left leg: Secondary | ICD-10-CM | POA: Diagnosis present

## 2020-01-25 DIAGNOSIS — R6510 Systemic inflammatory response syndrome (SIRS) of non-infectious origin without acute organ dysfunction: Secondary | ICD-10-CM | POA: Diagnosis present

## 2020-01-25 DIAGNOSIS — R933 Abnormal findings on diagnostic imaging of other parts of digestive tract: Secondary | ICD-10-CM

## 2020-01-25 DIAGNOSIS — R069 Unspecified abnormalities of breathing: Secondary | ICD-10-CM

## 2020-01-25 DIAGNOSIS — Z66 Do not resuscitate: Secondary | ICD-10-CM | POA: Diagnosis present

## 2020-01-25 DIAGNOSIS — K224 Dyskinesia of esophagus: Secondary | ICD-10-CM | POA: Diagnosis present

## 2020-01-25 DIAGNOSIS — D509 Iron deficiency anemia, unspecified: Secondary | ICD-10-CM | POA: Diagnosis present

## 2020-01-25 DIAGNOSIS — K219 Gastro-esophageal reflux disease without esophagitis: Secondary | ICD-10-CM | POA: Diagnosis present

## 2020-01-25 DIAGNOSIS — R7401 Elevation of levels of liver transaminase levels: Secondary | ICD-10-CM | POA: Diagnosis not present

## 2020-01-25 DIAGNOSIS — R0902 Hypoxemia: Secondary | ICD-10-CM

## 2020-01-25 DIAGNOSIS — Z79899 Other long term (current) drug therapy: Secondary | ICD-10-CM

## 2020-01-25 DIAGNOSIS — F419 Anxiety disorder, unspecified: Secondary | ICD-10-CM | POA: Diagnosis present

## 2020-01-25 DIAGNOSIS — Q399 Congenital malformation of esophagus, unspecified: Secondary | ICD-10-CM | POA: Diagnosis not present

## 2020-01-25 DIAGNOSIS — E43 Unspecified severe protein-calorie malnutrition: Secondary | ICD-10-CM

## 2020-01-25 DIAGNOSIS — E875 Hyperkalemia: Secondary | ICD-10-CM | POA: Diagnosis present

## 2020-01-25 DIAGNOSIS — J9621 Acute and chronic respiratory failure with hypoxia: Secondary | ICD-10-CM | POA: Diagnosis present

## 2020-01-25 DIAGNOSIS — Q396 Congenital diverticulum of esophagus: Secondary | ICD-10-CM

## 2020-01-25 DIAGNOSIS — K222 Esophageal obstruction: Secondary | ICD-10-CM | POA: Diagnosis not present

## 2020-01-25 DIAGNOSIS — I5023 Acute on chronic systolic (congestive) heart failure: Secondary | ICD-10-CM | POA: Diagnosis present

## 2020-01-25 DIAGNOSIS — K228 Other specified diseases of esophagus: Secondary | ICD-10-CM | POA: Diagnosis present

## 2020-01-25 DIAGNOSIS — I2729 Other secondary pulmonary hypertension: Secondary | ICD-10-CM | POA: Diagnosis present

## 2020-01-25 DIAGNOSIS — I2510 Atherosclerotic heart disease of native coronary artery without angina pectoris: Secondary | ICD-10-CM | POA: Diagnosis present

## 2020-01-25 DIAGNOSIS — E872 Acidosis: Secondary | ICD-10-CM | POA: Diagnosis present

## 2020-01-25 DIAGNOSIS — Z90710 Acquired absence of both cervix and uterus: Secondary | ICD-10-CM

## 2020-01-25 DIAGNOSIS — I340 Nonrheumatic mitral (valve) insufficiency: Secondary | ICD-10-CM | POA: Diagnosis not present

## 2020-01-25 DIAGNOSIS — R945 Abnormal results of liver function studies: Secondary | ICD-10-CM

## 2020-01-25 DIAGNOSIS — J449 Chronic obstructive pulmonary disease, unspecified: Secondary | ICD-10-CM | POA: Diagnosis present

## 2020-01-25 DIAGNOSIS — R54 Age-related physical debility: Secondary | ICD-10-CM | POA: Diagnosis present

## 2020-01-25 DIAGNOSIS — R1310 Dysphagia, unspecified: Secondary | ICD-10-CM | POA: Diagnosis not present

## 2020-01-25 DIAGNOSIS — W19XXXA Unspecified fall, initial encounter: Secondary | ICD-10-CM | POA: Diagnosis not present

## 2020-01-25 DIAGNOSIS — R1013 Epigastric pain: Secondary | ICD-10-CM | POA: Diagnosis not present

## 2020-01-25 DIAGNOSIS — K761 Chronic passive congestion of liver: Secondary | ICD-10-CM | POA: Diagnosis present

## 2020-01-25 DIAGNOSIS — Z681 Body mass index (BMI) 19 or less, adult: Secondary | ICD-10-CM | POA: Diagnosis not present

## 2020-01-25 DIAGNOSIS — F0150 Vascular dementia without behavioral disturbance: Secondary | ICD-10-CM | POA: Diagnosis present

## 2020-01-25 DIAGNOSIS — Z993 Dependence on wheelchair: Secondary | ICD-10-CM

## 2020-01-25 DIAGNOSIS — K449 Diaphragmatic hernia without obstruction or gangrene: Secondary | ICD-10-CM | POA: Diagnosis present

## 2020-01-25 DIAGNOSIS — F418 Other specified anxiety disorders: Secondary | ICD-10-CM | POA: Diagnosis present

## 2020-01-25 DIAGNOSIS — E440 Moderate protein-calorie malnutrition: Secondary | ICD-10-CM | POA: Diagnosis present

## 2020-01-25 DIAGNOSIS — J9602 Acute respiratory failure with hypercapnia: Secondary | ICD-10-CM | POA: Diagnosis present

## 2020-01-25 DIAGNOSIS — I714 Abdominal aortic aneurysm, without rupture: Secondary | ICD-10-CM | POA: Diagnosis present

## 2020-01-25 DIAGNOSIS — I25119 Atherosclerotic heart disease of native coronary artery with unspecified angina pectoris: Secondary | ICD-10-CM | POA: Diagnosis present

## 2020-01-25 DIAGNOSIS — I351 Nonrheumatic aortic (valve) insufficiency: Secondary | ICD-10-CM | POA: Diagnosis not present

## 2020-01-25 DIAGNOSIS — K760 Fatty (change of) liver, not elsewhere classified: Secondary | ICD-10-CM | POA: Diagnosis present

## 2020-01-25 DIAGNOSIS — R64 Cachexia: Secondary | ICD-10-CM | POA: Diagnosis present

## 2020-01-25 DIAGNOSIS — E785 Hyperlipidemia, unspecified: Secondary | ICD-10-CM | POA: Diagnosis present

## 2020-01-25 DIAGNOSIS — Z87891 Personal history of nicotine dependence: Secondary | ICD-10-CM

## 2020-01-25 DIAGNOSIS — Z7982 Long term (current) use of aspirin: Secondary | ICD-10-CM

## 2020-01-25 DIAGNOSIS — E7800 Pure hypercholesterolemia, unspecified: Secondary | ICD-10-CM | POA: Diagnosis present

## 2020-01-25 DIAGNOSIS — M069 Rheumatoid arthritis, unspecified: Secondary | ICD-10-CM | POA: Diagnosis present

## 2020-01-25 DIAGNOSIS — R230 Cyanosis: Secondary | ICD-10-CM | POA: Diagnosis present

## 2020-01-25 DIAGNOSIS — Z91041 Radiographic dye allergy status: Secondary | ICD-10-CM

## 2020-01-25 DIAGNOSIS — R1319 Other dysphagia: Secondary | ICD-10-CM | POA: Diagnosis not present

## 2020-01-25 LAB — COMPREHENSIVE METABOLIC PANEL
ALT: 289 U/L — ABNORMAL HIGH (ref 0–44)
AST: 698 U/L — ABNORMAL HIGH (ref 15–41)
Albumin: 2.8 g/dL — ABNORMAL LOW (ref 3.5–5.0)
Alkaline Phosphatase: 150 U/L — ABNORMAL HIGH (ref 38–126)
Anion gap: 10 (ref 5–15)
BUN: 42 mg/dL — ABNORMAL HIGH (ref 8–23)
CO2: 28 mmol/L (ref 22–32)
Calcium: 9 mg/dL (ref 8.9–10.3)
Chloride: 98 mmol/L (ref 98–111)
Creatinine, Ser: 1.35 mg/dL — ABNORMAL HIGH (ref 0.44–1.00)
GFR calc Af Amer: 45 mL/min — ABNORMAL LOW (ref >60)
GFR calc non Af Amer: 39 mL/min — ABNORMAL LOW (ref >60)
Glucose, Bld: 120 mg/dL — ABNORMAL HIGH (ref 70–99)
Potassium: 6.7 mmol/L (ref 3.5–5.1)
Sodium: 136 mmol/L (ref 135–145)
Total Bilirubin: 1.2 mg/dL (ref 0.3–1.2)
Total Protein: 7.9 g/dL (ref 6.5–8.1)

## 2020-01-25 LAB — I-STAT ARTERIAL BLOOD GAS, ED
Acid-Base Excess: 3 mmol/L — ABNORMAL HIGH (ref 0.0–2.0)
Acid-Base Excess: 6 mmol/L — ABNORMAL HIGH (ref 0.0–2.0)
Bicarbonate: 32.8 mmol/L — ABNORMAL HIGH (ref 20.0–28.0)
Bicarbonate: 32.4 mmol/L — ABNORMAL HIGH (ref 20.0–28.0)
Calcium, Ion: 1.24 mmol/L (ref 1.15–1.40)
Calcium, Ion: 1.16 mmol/L (ref 1.15–1.40)
HCT: 39 % (ref 36.0–46.0)
HCT: 38 % (ref 36.0–46.0)
Hemoglobin: 13.3 g/dL (ref 12.0–15.0)
Hemoglobin: 12.9 g/dL (ref 12.0–15.0)
O2 Saturation: 94 %
O2 Saturation: 90 %
Patient temperature: 97.9
Patient temperature: 97.9
Potassium: 6.4 mmol/L (ref 3.5–5.1)
Potassium: 6.1 mmol/L — ABNORMAL HIGH (ref 3.5–5.1)
Sodium: 137 mmol/L (ref 135–145)
Sodium: 136 mmol/L (ref 135–145)
TCO2: 35 mmol/L — ABNORMAL HIGH (ref 22–32)
TCO2: 34 mmol/L — ABNORMAL HIGH (ref 22–32)
pCO2 arterial: 73.1 mmHg (ref 32.0–48.0)
pCO2 arterial: 55.2 mmHg — ABNORMAL HIGH (ref 32.0–48.0)
pH, Arterial: 7.258 — ABNORMAL LOW (ref 7.350–7.450)
pH, Arterial: 7.374 (ref 7.350–7.450)
pO2, Arterial: 84 mmHg (ref 83.0–108.0)
pO2, Arterial: 60 mmHg — ABNORMAL LOW (ref 83.0–108.0)

## 2020-01-25 LAB — HEPATIC FUNCTION PANEL
ALT: 345 U/L — ABNORMAL HIGH (ref 0–44)
AST: 733 U/L — ABNORMAL HIGH (ref 15–41)
Albumin: 2.7 g/dL — ABNORMAL LOW (ref 3.5–5.0)
Alkaline Phosphatase: 132 U/L — ABNORMAL HIGH (ref 38–126)
Bilirubin, Direct: 0.2 mg/dL (ref 0.0–0.2)
Indirect Bilirubin: 0.7 mg/dL (ref 0.3–0.9)
Total Bilirubin: 0.9 mg/dL (ref 0.3–1.2)
Total Protein: 7.5 g/dL (ref 6.5–8.1)

## 2020-01-25 LAB — LACTIC ACID, PLASMA
Lactic Acid, Venous: 1.3 mmol/L (ref 0.5–1.9)
Lactic Acid, Venous: 1.2 mmol/L (ref 0.5–1.9)

## 2020-01-25 LAB — CBC WITH DIFFERENTIAL/PLATELET
Abs Immature Granulocytes: 0.07 10*3/uL (ref 0.00–0.07)
Basophils Absolute: 0 10*3/uL (ref 0.0–0.1)
Basophils Relative: 0 %
Eosinophils Absolute: 0 10*3/uL (ref 0.0–0.5)
Eosinophils Relative: 0 %
HCT: 41.9 % (ref 36.0–46.0)
Hemoglobin: 12.5 g/dL (ref 12.0–15.0)
Immature Granulocytes: 1 %
Lymphocytes Relative: 6 %
Lymphs Abs: 0.3 10*3/uL — ABNORMAL LOW (ref 0.7–4.0)
MCH: 29.1 pg (ref 26.0–34.0)
MCHC: 29.8 g/dL — ABNORMAL LOW (ref 30.0–36.0)
MCV: 97.7 fL (ref 80.0–100.0)
Monocytes Absolute: 0.1 10*3/uL (ref 0.1–1.0)
Monocytes Relative: 1 %
Neutro Abs: 5.1 10*3/uL (ref 1.7–7.7)
Neutrophils Relative %: 92 %
Platelets: 177 10*3/uL (ref 150–400)
RBC: 4.29 MIL/uL (ref 3.87–5.11)
RDW: 15.3 % (ref 11.5–15.5)
WBC: 5.6 10*3/uL (ref 4.0–10.5)
nRBC: 0 % (ref 0.0–0.2)

## 2020-01-25 LAB — URINALYSIS, ROUTINE W REFLEX MICROSCOPIC
Bilirubin Urine: NEGATIVE
Glucose, UA: NEGATIVE mg/dL
Hgb urine dipstick: NEGATIVE
Ketones, ur: NEGATIVE mg/dL
Leukocytes,Ua: NEGATIVE
Nitrite: NEGATIVE
Protein, ur: NEGATIVE mg/dL
Specific Gravity, Urine: 1.017 (ref 1.005–1.030)
pH: 5 (ref 5.0–8.0)

## 2020-01-25 LAB — SARS CORONAVIRUS 2 BY RT PCR (HOSPITAL ORDER, PERFORMED IN ~~LOC~~ HOSPITAL LAB): SARS Coronavirus 2: NEGATIVE

## 2020-01-25 LAB — TROPONIN I (HIGH SENSITIVITY)
Troponin I (High Sensitivity): 49 ng/L — ABNORMAL HIGH
Troponin I (High Sensitivity): 51 ng/L — ABNORMAL HIGH

## 2020-01-25 LAB — BASIC METABOLIC PANEL WITH GFR
Anion gap: 11 (ref 5–15)
BUN: 40 mg/dL — ABNORMAL HIGH (ref 8–23)
CO2: 29 mmol/L (ref 22–32)
Calcium: 8.9 mg/dL (ref 8.9–10.3)
Chloride: 97 mmol/L — ABNORMAL LOW (ref 98–111)
Creatinine, Ser: 1.28 mg/dL — ABNORMAL HIGH (ref 0.44–1.00)
GFR calc Af Amer: 48 mL/min — ABNORMAL LOW
GFR calc non Af Amer: 42 mL/min — ABNORMAL LOW
Glucose, Bld: 202 mg/dL — ABNORMAL HIGH (ref 70–99)
Potassium: 4.7 mmol/L (ref 3.5–5.1)
Sodium: 137 mmol/L (ref 135–145)

## 2020-01-25 LAB — CREATININE, URINE, RANDOM: Creatinine, Urine: 86.22 mg/dL

## 2020-01-25 LAB — CK: Total CK: 27 U/L — ABNORMAL LOW (ref 38–234)

## 2020-01-25 LAB — PROTIME-INR
INR: 1.5 — ABNORMAL HIGH (ref 0.8–1.2)
Prothrombin Time: 17.1 seconds — ABNORMAL HIGH (ref 11.4–15.2)

## 2020-01-25 LAB — ACETAMINOPHEN LEVEL: Acetaminophen (Tylenol), Serum: <10 ug/mL — ABNORMAL LOW (ref 10–30)

## 2020-01-25 LAB — SALICYLATE LEVEL: Salicylate Lvl: <7 mg/dL — ABNORMAL LOW (ref 7.0–30.0)

## 2020-01-25 LAB — SODIUM, URINE, RANDOM: Sodium, Ur: 50 mmol/L

## 2020-01-25 LAB — GLUCOSE, RANDOM: Glucose, Bld: 120 mg/dL — ABNORMAL HIGH (ref 70–99)

## 2020-01-25 LAB — CBG MONITORING, ED: Glucose-Capillary: 199 mg/dL — ABNORMAL HIGH (ref 70–99)

## 2020-01-25 LAB — BRAIN NATRIURETIC PEPTIDE: B Natriuretic Peptide: 1062.7 pg/mL — ABNORMAL HIGH (ref 0.0–100.0)

## 2020-01-25 MED ORDER — SODIUM ZIRCONIUM CYCLOSILICATE 10 G PO PACK
10.0000 g | PACK | Freq: Once | ORAL | Status: AC
  Administered 2020-01-25: 10 g via ORAL
  Filled 2020-01-25: qty 1

## 2020-01-25 MED ORDER — ONDANSETRON HCL 4 MG PO TABS: 4.0000 mg | ORAL_TABLET | Freq: Four times a day (QID) | ORAL | Status: DC | PRN

## 2020-01-25 MED ORDER — DEXTROSE 50 % IV SOLN
1.0000 | Freq: Once | INTRAVENOUS | Status: AC
  Administered 2020-01-25: 50 mL via INTRAVENOUS
  Filled 2020-01-25: qty 50

## 2020-01-25 MED ORDER — POLYETHYLENE GLYCOL 3350 17 G PO PACK
17.0000 g | PACK | Freq: Every day | ORAL | Status: DC
  Administered 2020-01-25 – 2020-02-03 (×4): 17 g via ORAL
  Filled 2020-01-25 (×8): qty 1

## 2020-01-25 MED ORDER — METHYLPREDNISOLONE SODIUM SUCC 125 MG IJ SOLR: 60.0000 mg | Freq: Three times a day (TID) | INTRAMUSCULAR | Status: DC

## 2020-01-25 MED ORDER — POLYETHYLENE GLYCOL 3350 17 GM/SCOOP PO POWD
17.0000 g | Freq: Every day | ORAL | Status: DC
  Filled 2020-01-25: qty 255

## 2020-01-25 MED ORDER — DULOXETINE HCL 60 MG PO CPEP
60.0000 mg | ORAL_CAPSULE | Freq: Every day | ORAL | Status: DC
  Administered 2020-01-25 – 2020-02-03 (×10): 60 mg via ORAL
  Filled 2020-01-25 (×11): qty 1

## 2020-01-25 MED ORDER — DILTIAZEM HCL 30 MG PO TABS
30.0000 mg | ORAL_TABLET | Freq: Three times a day (TID) | ORAL | Status: DC
  Administered 2020-01-25 – 2020-02-03 (×26): 30 mg via ORAL
  Filled 2020-01-25 (×29): qty 1

## 2020-01-25 MED ORDER — ALBUTEROL SULFATE (2.5 MG/3ML) 0.083% IN NEBU: 2.5000 mg | INHALATION_SOLUTION | RESPIRATORY_TRACT | Status: DC | PRN

## 2020-01-25 MED ORDER — IPRATROPIUM-ALBUTEROL 0.5-2.5 (3) MG/3ML IN SOLN
RESPIRATORY_TRACT | Status: AC
  Filled 2020-01-25: qty 3

## 2020-01-25 MED ORDER — LEVETIRACETAM IN NACL 500 MG/100ML IV SOLN
500.0000 mg | Freq: Once | INTRAVENOUS | Status: AC
  Administered 2020-01-25: 500 mg via INTRAVENOUS
  Filled 2020-01-25: qty 100

## 2020-01-25 MED ORDER — PANTOPRAZOLE SODIUM 40 MG PO TBEC
40.0000 mg | DELAYED_RELEASE_TABLET | Freq: Two times a day (BID) | ORAL | Status: DC
  Administered 2020-01-25 – 2020-02-03 (×18): 40 mg via ORAL
  Filled 2020-01-25 (×18): qty 1

## 2020-01-25 MED ORDER — OXYCODONE HCL 5 MG PO TABS
5.0000 mg | ORAL_TABLET | Freq: Three times a day (TID) | ORAL | Status: DC | PRN
  Administered 2020-01-29 – 2020-02-03 (×7): 5 mg via ORAL
  Filled 2020-01-25 (×7): qty 1

## 2020-01-25 MED ORDER — SODIUM CHLORIDE 0.9% FLUSH
3.0000 mL | Freq: Two times a day (BID) | INTRAVENOUS | Status: DC
  Administered 2020-01-25 – 2020-02-03 (×18): 3 mL via INTRAVENOUS

## 2020-01-25 MED ORDER — ENOXAPARIN SODIUM 40 MG/0.4ML ~~LOC~~ SOLN
40.0000 mg | SUBCUTANEOUS | Status: DC
  Administered 2020-01-25 – 2020-02-03 (×10): 40 mg via SUBCUTANEOUS
  Filled 2020-01-25 (×10): qty 0.4

## 2020-01-25 MED ORDER — DILTIAZEM HCL 30 MG PO TABS: 30.0000 mg | ORAL_TABLET | Freq: Three times a day (TID) | ORAL | Status: DC

## 2020-01-25 MED ORDER — ASPIRIN EC 81 MG PO TBEC
81.0000 mg | DELAYED_RELEASE_TABLET | Freq: Every day | ORAL | Status: DC
  Administered 2020-01-26 – 2020-02-03 (×9): 81 mg via ORAL
  Filled 2020-01-25 (×9): qty 1

## 2020-01-25 MED ORDER — METHYLPREDNISOLONE SODIUM SUCC 125 MG IJ SOLR
60.0000 mg | Freq: Two times a day (BID) | INTRAMUSCULAR | Status: DC
  Administered 2020-01-25 – 2020-01-27 (×5): 60 mg via INTRAVENOUS
  Filled 2020-01-25 (×5): qty 2

## 2020-01-25 MED ORDER — DOCUSATE SODIUM 100 MG PO CAPS
100.0000 mg | ORAL_CAPSULE | Freq: Two times a day (BID) | ORAL | Status: DC
  Administered 2020-01-25 – 2020-02-03 (×17): 100 mg via ORAL
  Filled 2020-01-25 (×18): qty 1

## 2020-01-25 MED ORDER — GABAPENTIN 100 MG PO CAPS
100.0000 mg | ORAL_CAPSULE | Freq: Two times a day (BID) | ORAL | Status: DC
  Administered 2020-01-25 – 2020-02-03 (×18): 100 mg via ORAL
  Filled 2020-01-25 (×18): qty 1

## 2020-01-25 MED ORDER — SODIUM CHLORIDE 0.9 % IV SOLN: Freq: Once | INTRAVENOUS | Status: DC

## 2020-01-25 MED ORDER — LEVETIRACETAM 500 MG PO TABS
500.0000 mg | ORAL_TABLET | Freq: Two times a day (BID) | ORAL | Status: DC
  Administered 2020-01-25 – 2020-02-03 (×18): 500 mg via ORAL
  Filled 2020-01-25 (×18): qty 1

## 2020-01-25 MED ORDER — ONDANSETRON HCL 4 MG/2ML IJ SOLN: 4.0000 mg | Freq: Four times a day (QID) | INTRAMUSCULAR | Status: DC | PRN

## 2020-01-25 MED ORDER — LEVETIRACETAM 500 MG PO TABS: 500.0000 mg | ORAL_TABLET | Freq: Two times a day (BID) | ORAL | Status: DC

## 2020-01-25 MED ORDER — ALBUTEROL SULFATE (2.5 MG/3ML) 0.083% IN NEBU
10.0000 mg | INHALATION_SOLUTION | Freq: Once | RESPIRATORY_TRACT | Status: AC
  Administered 2020-01-25: 10 mg via RESPIRATORY_TRACT
  Filled 2020-01-25: qty 12

## 2020-01-25 MED ORDER — ALPRAZOLAM 0.25 MG PO TABS
0.2500 mg | ORAL_TABLET | Freq: Two times a day (BID) | ORAL | Status: DC
  Administered 2020-01-25 – 2020-02-03 (×18): 0.25 mg via ORAL
  Filled 2020-01-25 (×18): qty 1

## 2020-01-25 MED ORDER — LORAZEPAM 2 MG/ML IJ SOLN: 0.2500 mg | INTRAMUSCULAR | Status: DC | PRN

## 2020-01-25 MED ORDER — THIAMINE HCL 100 MG PO TABS
100.0000 mg | ORAL_TABLET | Freq: Every day | ORAL | Status: DC
  Administered 2020-01-26 – 2020-02-03 (×9): 100 mg via ORAL
  Filled 2020-01-25 (×9): qty 1

## 2020-01-25 MED ORDER — IPRATROPIUM-ALBUTEROL 0.5-2.5 (3) MG/3ML IN SOLN
3.0000 mL | Freq: Three times a day (TID) | RESPIRATORY_TRACT | Status: DC
  Administered 2020-01-26: 3 mL via RESPIRATORY_TRACT
  Filled 2020-01-25: qty 3

## 2020-01-25 MED ORDER — IPRATROPIUM-ALBUTEROL 0.5-2.5 (3) MG/3ML IN SOLN
3.0000 mL | RESPIRATORY_TRACT | Status: DC
  Administered 2020-01-25 (×4): 3 mL via RESPIRATORY_TRACT
  Filled 2020-01-25 (×3): qty 3

## 2020-01-25 MED ORDER — LORAZEPAM 2 MG/ML IJ SOLN
0.5000 mg | INTRAMUSCULAR | Status: DC | PRN
  Administered 2020-01-25: 0.5 mg via INTRAVENOUS
  Filled 2020-01-25: qty 1

## 2020-01-25 MED ORDER — INSULIN ASPART 100 UNIT/ML IV SOLN
5.0000 [IU] | Freq: Once | INTRAVENOUS | Status: AC
  Administered 2020-01-25: 5 [IU] via INTRAVENOUS

## 2020-01-25 MED ORDER — MORPHINE SULFATE (PF) 2 MG/ML IV SOLN
2.0000 mg | INTRAVENOUS | Status: DC | PRN
  Administered 2020-01-25 – 2020-02-02 (×5): 2 mg via INTRAVENOUS
  Filled 2020-01-25 (×5): qty 1

## 2020-01-25 MED ORDER — FOLIC ACID 1 MG PO TABS
1.0000 mg | ORAL_TABLET | Freq: Every day | ORAL | Status: DC
  Administered 2020-01-27 – 2020-02-03 (×8): 1 mg via ORAL
  Filled 2020-01-25 (×8): qty 1

## 2020-01-25 MED ORDER — DEXTROSE 10 % IV SOLN: Freq: Once | INTRAVENOUS | Status: AC

## 2020-01-25 MED ORDER — SODIUM CHLORIDE 0.9 % IV SOLN: INTRAVENOUS | Status: DC

## 2020-01-25 MED ORDER — FERROUS SULFATE 325 (65 FE) MG PO TABS
325.0000 mg | ORAL_TABLET | Freq: Every day | ORAL | Status: DC
  Administered 2020-01-26 – 2020-02-03 (×9): 325 mg via ORAL
  Filled 2020-01-25 (×9): qty 1

## 2020-01-25 NOTE — Consult Note (Addendum)
[facility] Consult: 2:06 PM 01/25/2020  LOS: 0 days    Referring Provider: Dr Tamala Julian  Primary Care Physician:  Rochel Brome, MD Primary Gastroenterologist:  Dr. Lyndel Safe    Reason for Consultation:  Dysphagia   HPI: Whitney Steele is a 74 y.o. female.  SNF resident.  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  Vascular dementia.  HTN,  Anemia, on folic acid and iron.  Hgb 6.8 >> 1 PRBC >> 9.9 in 10/2018 during admission for [facility].  Hypothyroidism. RA.  Chronic pain. Depression/anxiety.  COPD.  Pulmonary htn.  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 Malnutrition.  AAA.   Fatty liver per 2007 ultrasound. Surgeries include cholecystectomy, incisional herniorrhaphy, oophorectomy, abdominal hysterectomy.     09/2016 EGD:  Schatzki's ring, dilated to 50 Fr Maloney, 5 cm HH, diverticulum at mid esophagus.   Esophagram (date unknown): presbyesophagus, esoph dysmotility.   Recently treated for diverticulitis w cipro/flagyl per [facility] ED.  Pt repeatedly refused/refuses colonoscopy.     Seen at Dr Steve Rattler office 8/5 for several months of stable solid food dysphagia.   Tolerating chopped foods. Also c/o constipation a/w narcotics.  MD planned EGD, dilatation date/time not yet set up. Higher risk due to COPD/Oxygen. Had pt continue PPI.     Sent from SNF today for SOB, hypoxia, AMS.   In ED awaiting admission w oxygen sats mid 70s.  CXR w cardiomegaly, emphysema, interstitial coarsening.   t bili 1.2.  Alk phos 150.  AST/ALT 698/289.  BNP 1062.  Trop I 49. 51.  LFTs were normal at last assay 10/2018. Lactate not elevated. AKI 42/1.3.  K 6.7 >> Lokelma >> 4.7.   WBCs, Hgb, platelets unremarkable.    Pt fell out of bed a few d ago and MD concerned she may have rhabdo, CK is pndg.    Felling better on  bipap.  Pulmonary has seen pt w acute COPd flare, on nebs, steroids.  Pt denies abdominal pain.  Some nausea, no vomiting.  No black or bloody stools.  No alcohol.      Past Medical History:  Diagnosis Date  . Abnormal AST and ALT   . Allergic rhinitis   . Anemia   . Anxiety   . Atherosclerotic heart disease of native coronary artery with unspecified angina pectoris ([place])   . Benign essential hypertension   . 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 COPD (chronic obstructive pulmonary disease) ([place])   . Depression with anxiety   . Dysphagia   . Foley catheter in place   . GERD (gastroesophageal reflux disease)   . Hypercholesterolemia   . Hypertension   . Hypothyroidism   . Insomnia   . Neuromuscular dysfunction of bladder, unspecified   . Osteoporosis   . Other osteoporosis without current pathological fracture   . Other seizures ([place])   . Pulmonary fibrosis ([place])   . Rheumatoid arthritis ([place])   . Unspecified protein-calorie malnutrition ([place])   . Unstageable pressure ulcer of sacral region ([place])   . Vascular dementia without  behavioral disturbance [facility])     Past Surgical History:  Procedure Laterality Date  . ABDOMINAL HYSTERECTOMY    . AMPUTATION Left 10/09/2018   Procedure: LEFT ABOVE KNEE AMPUTATION;  Surgeon: Newt Minion, MD;  Location: [place];  Service: Orthopedics;  Laterality: Left;  . 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 CARPAL TUNNEL RELEASE    . ESOPHAGOGASTRODUODENOSCOPY  09/12/2016   Distal esophageal Schatzkis ring. Moderate hh. Highly tourturous esophagus. Mild esophageal diverticulum. Superficial gastric ulcer. Esophagus Bx: benign squamoglandular mucosa with chronic inflammation and squamous hyperplasia suggestive of gastroesophageal reflux disease. Stomach Bx: Ulceration and reactive gastropthy.  . ESOPHAGOGASTRODUODENOSCOPY  09/12/2016   Distal esophageal Schatzkis ring status post esophageal dilitation.  Moderate hiatal hernia. Highly torturous esophagus suggestive of esophageal dysmotility/presbyesophagus. Mid esophageal diverticulum. Superficial gastric ulcer.   . INCISIONAL HERNIA REPAIR    . LAPAROSCOPIC CHOLECYSTECTOMY    . OVARY SURGERY     removal of ovarian masses    Prior to Admission medications   Medication Sig Start Date End Date Taking? Authorizing Provider  Abatacept (ORENCIA) 125 MG/ML SOSY Inject 125 mg into the skin every Thursday.   Yes [provider]  acetaminophen (TYLENOL) 325 MG tablet Take 650 mg by mouth every 6 (six) hours as needed for mild pain or fever.   Yes [provider]  albuterol (PROVENTIL) (2.5 MG/3ML) 0.083% nebulizer solution Take 2.5 mg by nebulization every 4 (four) hours as needed for shortness of breath.   Yes [provider]  albuterol (VENTOLIN HFA) 108 (90 Base) MCG/ACT inhaler Inhale 2 puffs into the lungs every 6 (six) hours as needed for wheezing or shortness of breath.   Yes [provider]  ALPRAZolam (XANAX) 0.25 MG tablet Take 0.25 mg by mouth 2 (two) times daily.  09/17/19  Yes [provider]  aspirin EC 81 MG tablet Take 81 mg by mouth daily.    Yes [provider]  atorvastatin (LIPITOR) 20 MG tablet Take 20 mg by mouth at bedtime.    Yes [provider]  celecoxib (CELEBREX) 100 MG capsule Take 100 mg by mouth every 12 (twelve) hours as needed for pain. 08/11/19  Yes [provider]  Cholecalciferol (VITAMIN D3) 125 MCG (5000 UT) CAPS Take 5,000 Units by mouth daily.   Yes [provider]  diltiazem (CARDIZEM) 30 MG tablet Take 30 mg by mouth 3 (three) times daily.    Yes [provider]  docusate sodium (COLACE) 100 MG capsule Take 100 mg by mouth 2 (two) times daily.   Yes [provider]  DULoxetine (CYMBALTA) 60 MG capsule Take 60 mg by mouth daily.   Yes [provider]  feeding supplement, ENSURE ENLIVE, (ENSURE ENLIVE) LIQD Take  237 mLs by mouth 3 (three) times daily with meals. 10/14/18  Yes Seawell, Jaimie A, DO  ferrous sulfate 325 (65 FE) MG tablet Take 325 mg by mouth daily with breakfast.   Yes [provider]  folic acid (FOLVITE) 1 MG tablet Take 1 tablet (1 mg total) by mouth daily. 10/15/18  Yes Seawell, Jaimie A, DO  gabapentin (NEURONTIN) 100 MG capsule Take 1 capsule (100 mg total) by mouth 2 (two) times daily. 10/21/18  Yes Suzan Slick, NP  geriatric multivitamins-minerals (ELDERTONIC/GEVRABON) LIQD Take 15 mLs by mouth daily.   Yes [provider]  Ipratropium-Albuterol (COMBIVENT RESPIMAT) 20-100 MCG/ACT AERS respimat Inhale 1 puff into the lungs every 6 (six) hours as  needed for shortness of breath.    Yes [provider]  ipratropium-albuterol (DUONEB) 0.5-2.5 (3) MG/3ML SOLN Take 3 mLs by nebulization every 6 (six) hours as needed (for wheezing or shortness of breath).   Yes [provider]  isosorbide dinitrate (ISORDIL) 30 MG tablet Take 30 mg by mouth 2 (two) times daily. 09/24/19  Yes [provider]  levETIRAcetam (KEPPRA) 500 MG tablet Take 500 mg by mouth 2 (two) times daily.   Yes [provider]  loratadine (CLARITIN) 10 MG tablet Take 10 mg by mouth daily.   Yes [provider]  Multiple Vitamin (MULTIVITAMIN WITH MINERALS) TABS tablet Take 1 tablet by mouth daily.   Yes [provider]  omeprazole (PRILOSEC) 40 MG capsule Take 1 capsule (40 mg total) by mouth 2 (two) times daily. 01/18/19  Yes Jackquline Denmark, MD  oxyCODONE-acetaminophen (PERCOCET/ROXICET) 5-325 MG tablet Take 1 tablet by mouth every 6 (six) hours as needed for moderate pain. Patient taking differently: Take 1 tablet by mouth every 8 (eight) hours as needed for moderate pain or severe pain.  10/14/18  Yes Santos-Sanchez, Merlene Morse, MD  OXYGEN Inhale 2 L/min into the lungs continuous.    Yes [provider]  polyethylene glycol powder (GLYCOLAX/MIRALAX) powder  Take 17 g by mouth daily. 11/27/17  Yes Jackquline Denmark, MD  promethazine (PHENERGAN) 25 MG tablet Take 25 mg by mouth every 4 (four) hours as needed for nausea or vomiting.   Yes [provider]  ranolazine (RANEXA) 500 MG 12 hr tablet Take 500 mg by mouth every 12 (twelve) hours.    Yes [provider]  thiamine 100 MG tablet Take 1 tablet (100 mg total) by mouth daily. 10/15/18  Yes Seawell, Jaimie A, DO  vitamin C (ASCORBIC ACID) 500 MG tablet Take 500 mg by mouth 2 (two) times daily.   Yes [provider]  melatonin 5 MG TABS Take 5 mg by mouth at bedtime. Patient not taking: Reported on 01/25/2020    [provider]  NON FORMULARY 2.5 mg every 4 (four) hours as needed. Albuterol Sulfate Nebulization Solution (2.'5MG'$ /3ML) 3 milliliter inhale orally via nebulizer every 4 hours as needed for Shortness of Breath Patient not taking: Reported on 01/25/2020    [provider]    Scheduled Meds: . ALPRAZolam  0.25 mg Oral BID  . aspirin EC  81 mg Oral Daily  . diltiazem  30 mg Oral TID  . docusate sodium  100 mg Oral BID  . DULoxetine  60 mg Oral Daily  . enoxaparin (LOVENOX) injection  40 mg Subcutaneous Q24H  . [START ON 01/26/2020] ferrous sulfate  325 mg Oral Q breakfast  . folic acid  1 mg Oral Daily  . gabapentin  100 mg Oral BID  . ipratropium-albuterol  3 mL Nebulization Q4H  . levETIRAcetam  500 mg Oral BID  . methylPREDNISolone (SOLU-MEDROL) injection  60 mg Intravenous BID  . pantoprazole  40 mg Oral BID  . polyethylene glycol powder  17 g Oral Daily  . sodium chloride flush  3 mL Intravenous Q12H  . thiamine  100 mg Oral Daily   Infusions: . sodium chloride     PRN Meds: ondansetron **OR** ondansetron (ZOFRAN) IV, oxyCODONE   Allergies as of 01/25/2020 - Review Complete 01/25/2020  Allergen Reaction Noted  . Iodinated diagnostic agents Hives 11/08/2015  . Tape Other (See Comments) 10/05/2018    Family History  Family history  unknown: Yes    Social  History   Socioeconomic History  . Marital status: Divorced    Spouse name: Not on file  . Number of children: Not on file  . Years of education: Not on file  . Highest education level: Not on file  Occupational History  . Not on file  Tobacco Use  . Smoking status: Never Smoker  . Smokeless tobacco: Never Used  Vaping Use  . Vaping Use: Never used  Substance and Sexual Activity  . Alcohol use: Not Currently  . Drug use: Never  . Sexual activity: Not on file  Other Topics Concern  . Not on file  Social History Narrative  . Not on file   Social Determinants of Health   Financial Resource Strain:   . Difficulty of Paying Living Expenses:   Food Insecurity:   . Worried About Charity fundraiser in the Last Year:   . Arboriculturist in the Last Year:   Transportation Needs:   . Film/video editor (Medical):   Marland Kitchen Lack of Transportation (Non-Medical):   Physical Activity:   . Days of Exercise per Week:   . Minutes of Exercise per Session:   Stress:   . Feeling of Stress :   Social Connections:   . Frequency of Communication with Friends and Family:   . Frequency of Social Gatherings with Friends and Family:   . Attends Religious Services:   . Active Member of Clubs or Organizations:   . Attends Archivist Meetings:   Marland Kitchen Marital Status:   Intimate Partner Violence:   . Fear of Current or Ex-Partner:   . Emotionally Abused:   Marland Kitchen Physically Abused:   . Sexually Abused:     REVIEW OF SYSTEMS: Constitutional: Weakness ENT:  No nose bleeds Pulm: Shortness of breath, hypoxia CV:  No palpitations, no LE edema.  No chest pain GU:  No hematuria, no frequency GI: See HPI Heme: No unusual or excessive bleeding/bruising. Transfusions: See HPI Neuro:  No headaches, no peripheral tingling or numbness.  No syncope, no seizures. Derm:  No itching, no rash or sores.  Endocrine:  No sweats or chills.  No polyuria or dysuria Immunization:  COVID-19 vaccination status unknown Travel:  None beyond local counties in last few months.    PHYSICAL EXAM: Vital signs in last 24 hours: Vitals:   01/25/20 1300 01/25/20 1321  BP: 133/84   Pulse: (!) 116   Resp: 19   Temp:    SpO2: 91% 93%   Wt Readings from Last 3 Encounters:  01/25/20 50.8 kg  01/13/20 50.8 kg  11/02/19 54.4 kg    General: Chronically and acutely ill-appearing elderly WF on BiPAP Head: No facial asymmetry or swelling.  No signs of head trauma. Eyes: No scleral icterus or conjunctival pallor.  EOMI Ears: Not hard of hearing Nose: No obvious discharge visible beneath the BiPAP mask Mouth: Unable to assess due to presence of BiPAP Neck: No JVD, no masses, no thyromegaly. Lungs: Increased work of breathing, diminished sounds at bases.  No cough Heart: RRR. Abdomen: Soft, nontender, nondistended.  No HSM, masses, bruits, hernias.  Active bowel sounds..   Rectal: Deferred Musc/Skeltl: Rheumatoid type deformities in the fingers/hands.  Patient unable to completely open her hands at this point due to fixed contractures. Extremities: Left AKA.  Foot on the right is warm, slight right LE edema Neurologic: Alert, appropriate.  Follows commands.  Moves all 4 limbs.  No tremors.  Speech limited by presence of BiPAP  Skin: Some minor purpura on her arms   Psych: Pleasant, cooperative, calm  Intake/Output from previous day: No intake/output data recorded. Intake/Output this shift: No intake/output data recorded.  LAB RESULTS: Recent Labs    01/25/20 0311 01/25/20 0342 01/25/20 0624  WBC 5.6  --   --   HGB 12.5 13.3 12.9  HCT 41.9 39.0 38.0  PLT 177  --   --    BMET Lab Results  Component Value Date   NA 137 01/25/2020   NA 136 01/25/2020   NA 137 01/25/2020   K 4.7 01/25/2020   K 6.1 (H) 01/25/2020   K 6.4 (HH) 01/25/2020   CL 97 (L) 01/25/2020   CL 98 01/25/2020   CL 99 10/13/2019   CO2 29 01/25/2020   CO2 28 01/25/2020   CO2 30 10/13/2019    GLUCOSE 202 (H) 01/25/2020   GLUCOSE 120 (H) 01/25/2020   GLUCOSE 120 (H) 01/25/2020   BUN 40 (H) 01/25/2020   BUN 42 (H) 01/25/2020   BUN 14 10/13/2019   CREATININE 1.28 (H) 01/25/2020   CREATININE 1.35 (H) 01/25/2020   CREATININE 0.67 10/13/2019   CALCIUM 8.9 01/25/2020   CALCIUM 9.0 01/25/2020   CALCIUM 8.7 (L) 10/13/2019   LFT Recent Labs    01/25/20 0311  PROT 7.9  ALBUMIN 2.8*  AST 698*  ALT 289*  ALKPHOS 150*  BILITOT 1.2   PT/INR Lab Results  Component Value Date   INR 1.5 (H) 01/25/2020   INR 1.1 10/05/2018   Hepatitis Panel No results for input(s): HEPBSAG, HCVAB, HEPAIGM, HEPBIGM in the last 72 hours. C-Diff No components found for: CDIFF Lipase  No results found for: LIPASE  Drugs of Abuse  No results found for: LABOPIA, COCAINSCRNUR, LABBENZ, AMPHETMU, THCU, LABBARB   RADIOLOGY STUDIES: DG Chest Portable 1 View  Result Date: 01/25/2020 CLINICAL DATA:  Shortness of breath. EXAM: PORTABLE CHEST 1 VIEW COMPARISON:  10/13/2019, CT 08/07/2018 FINDINGS: Mild cardiomegaly. Unchanged mediastinal contours. Emphysema with interstitial coarsening. Bibasilar reticulation consistent with honeycombing. No acute or confluent airspace disease. No pleural effusion or pneumothorax. Bones are under mineralized. No acute osseous abnormalities are seen. IMPRESSION: 1. Cardiomegaly. 2. Emphysema.  Basilar honeycombing/fibrosis. Electronically Signed   By: Keith Rake M.D.   On: 01/25/2020 03:10      IMPRESSION:     *    Dysphagia.  Stable difficulty negotiating solid food but tolerating chopped diet.  Previous 2018 dilation of esoph ring and dx of esoph dysmotility.    *   Elevated LFTs.  S/p lap chole.  No sxs sugg of biliary disease.  An ultrasound in 2007 showed fatty liver.  *   Concern for rhabdo from recent fall out of bed.  CK is pndg.    *   Acute hypoxia, COPD flare.  Baseline O2 dependent COPD, pulm fibrosis.  COVID-19 negative  *   Hyperkalemia.  Improved after Lokelma.    *   "Recent" antibiotics, Cipro/Flagyl, for diverticulitis per [facility] ED.  Reviewed imaging history for last several months and do not see any imaging which rules in diverticulitis.  Patient has repeatedly declined/refused screening colonoscopy over the years.  *   Hx anemia, on daily iron sulfate and folic acid.  Current Hgb 12.9.  *   Elevated blood glucose, no previous history diabetes mellitus  *    Elevated BNP at 1062, mildly elevated troponins at 49, 51.  Latest echocardiogram of record was 09/2018 revealing LVEF 55 to 60%,  mild MV thickening, mild AoV sclerosis    PLAN:     *  Once pt stabilized from resp perspective would obtain Ba esophagram to assess for distal esoph stricture and confirm structural issues leading to dysphagia.  If study merely confirms esoph dysmotility and no stuctural issues then no need to pursue high risk EGD/dilatation.  Additionally, may be worthwhile having speech-language pathology formally assess her swallowing, again once her acute respiratory distress resolves and she is off BiPAP  *   Obtain abdominal ultrasound for liver eval.     *   PT/INR  *   If/when safe to eat: add chopped, carb mod   Azucena Freed  01/25/2020, 2:06 PM Phone [phone]

## 2020-01-25 NOTE — ED Provider Notes (Signed)
12:18 PM Signout from [place] PA-C at shift change.   Patient here with respiratory failure, requiring BiPAP.  Tried was consulted overnight.  Requested input from critical care.  Critical care has seen patient and provide recommendations.  They have cleared for stepdown bed.  Repaged [facility].  I spoke with Dr. Katrinka Blazing who will see patient.  BP 118/76   Pulse (!) 111   Temp 97.9 F (36.6 C) (Oral)   Resp (!) 22   Ht 5\' 6"  (1.676 m)   Wt 50.8 kg   SpO2 95%   BMI 18.08 kg/m      , PA-C 01/25/20 1219    1220, MD 01/25/20 [phone]

## 2020-01-25 NOTE — ED Notes (Signed)
Pt keeps pulling off 02. Pt desat to 60's. 02 reapplied. MD Katrinka Blazing paged. Per MD will order soft restraints, attempt to switch to high flow 02.

## 2020-01-25 NOTE — Progress Notes (Signed)
Pt place back on BiPAP due to AMS.  10/5, 40, back up rate of 12.

## 2020-01-25 NOTE — H&P (Addendum)
History and Physical    Whitney Steele EAV:[id number] DOB: 07-31-1945 DOA: 01/25/2020  Referring MD/NP/PA: Renne Crigler, PA-C PCP: Blane Ohara, MD  Patient coming from: [facility] via EMS  Chief Complaint: Shortness of breath  I have personally briefly reviewed patient's old medical records in [facility]   HPI: Whitney Steele is a 74 y.o. female with medical history significant of vascular dementia, HTN, HLD, COPD with chronic respiratory failure on 3 L oxygen baseline, pulmonary fibrosis, RA, hypothyroidism, and dysphagia secondary to history of esophageal stricture followed by Dr. Chales Abrahams presented with complaints of being short of breath.  History is obtained from review of records, the patient and her daughter over the phone.  Noted associated symptoms of wheezing, vomiting, and reports recently falling out of bed a couple days ago.  Denies any loss of consciousness, leg swelling, or chest pain. Supposed to see Dr. Chales Abrahams of [facility] to have an EGD with esophageal dilation next week.  Daughter notes that the patient had recently been on diuretics for upper arm swelling.  Patient had received [employer] vaccine on the 13th of this month.   The nursing facility had no documentation of the patient falling out of bed this morning.  In route with EMS patient was noted to have O2 saturations in the mid 70s on room air and reportedly cyanotic.  She was placed on a nonrebreather with O2 sats 90%.   ED Course: Upon admission into the emergency department patient was noted to be afebrile, pulse 10 2-118, respirations 18-28 and blood pressures maintained.  Patient had temporarily been placed on BiPAP after initial ABG revealed pH 7.258 with PCO2 73.1 and PO2 84.  Critical care has been consulted.  Labs 8/17 had been significant for potassium 6.7, BUN 42, creatinine 1.35, AST 698, ALT 289, BNP 1062.7 and high-sensitivity troponin 49->51.  Chest x-ray revealed cardiomegaly and emphysema  with interstitial coarsening.  Patient was able to be weaned off of BiPAP to a pinch during past and is recommended TRH admit to stepdown.  However, patient was noted to be confused and required placement back on BiPAP.  Review of Systems  Unable to perform ROS: Severe respiratory distress  Respiratory: Positive for shortness of breath and wheezing.   Cardiovascular: Negative for chest pain.  Musculoskeletal: Positive for falls.  Neurological: Negative for loss of consciousness.    Past Medical History:  Diagnosis Date  . Abnormal AST and ALT   . Allergic rhinitis   . Anemia   . Anxiety   . Atherosclerotic heart disease of native coronary artery with unspecified angina pectoris (HCC)   . Benign essential hypertension   . 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 COPD (chronic obstructive pulmonary disease) (HCC)   . Depression with anxiety   . Dysphagia   . Foley catheter in place   . GERD (gastroesophageal reflux disease)   . Hypercholesterolemia   . Hypertension   . Hypothyroidism   . Insomnia   . Neuromuscular dysfunction of bladder, unspecified   . Osteoporosis   . Other osteoporosis without current pathological fracture   . Other seizures (HCC)   . Pulmonary fibrosis (HCC)   . Rheumatoid arthritis (HCC)   . Unspecified protein-calorie malnutrition (HCC)   . Unstageable pressure ulcer of sacral region (HCC)   . Vascular dementia without behavioral disturbance [facility])     Past Surgical History:  Procedure Laterality Date  . ABDOMINAL HYSTERECTOMY    .  AMPUTATION Left 10/09/2018   Procedure: LEFT ABOVE KNEE AMPUTATION;  Surgeon: Nadara Mustard, MD;  Location: [facility] OR;  Service: Orthopedics;  Laterality: Left;  . 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 CARPAL TUNNEL RELEASE    . ESOPHAGOGASTRODUODENOSCOPY  09/12/2016   Distal esophageal Schatzkis ring. Moderate hh. Highly tourturous esophagus. Mild esophageal diverticulum. Superficial  gastric ulcer. Esophagus Bx: benign squamoglandular mucosa with chronic inflammation and squamous hyperplasia suggestive of gastroesophageal reflux disease. Stomach Bx: Ulceration and reactive gastropthy.  . ESOPHAGOGASTRODUODENOSCOPY  09/12/2016   Distal esophageal Schatzkis ring status post esophageal dilitation. Moderate hiatal hernia. Highly torturous esophagus suggestive of esophageal dysmotility/presbyesophagus. Mid esophageal diverticulum. Superficial gastric ulcer.   . INCISIONAL HERNIA REPAIR    . LAPAROSCOPIC CHOLECYSTECTOMY    . OVARY SURGERY     removal of ovarian masses     reports that she has never smoked. She has never used smokeless tobacco. She reports previous alcohol use. She reports that she does not use drugs.  Allergies  Allergen Reactions  . Iodinated Diagnostic Agents Hives  . Tape Other (See Comments)    Tears skin - please use paper tape ("allergic," per paperwork)    Family History  Family history unknown: Yes    Prior to Admission medications   Medication Sig Start Date End Date Taking? Authorizing Provider  Abatacept (ORENCIA) 125 MG/ML SOSY Inject 125 mg into the skin every Thursday.   Yes [provider]  acetaminophen (TYLENOL) 325 MG tablet Take 650 mg by mouth every 6 (six) hours as needed for mild pain or fever.   Yes [provider]  albuterol (PROVENTIL) (2.5 MG/3ML) 0.083% nebulizer solution Take 2.5 mg by nebulization every 4 (four) hours as needed for shortness of breath.   Yes [provider]  albuterol (VENTOLIN HFA) 108 (90 Base) MCG/ACT inhaler Inhale 2 puffs into the lungs every 6 (six) hours as needed for wheezing or shortness of breath.   Yes [provider]  ALPRAZolam (XANAX) 0.25 MG tablet Take 0.25 mg by mouth 2 (two) times daily.  09/17/19  Yes [provider]  aspirin EC 81 MG tablet Take 81 mg by mouth daily.    Yes [provider]  atorvastatin (LIPITOR) 20 MG tablet Take 20 mg by  mouth at bedtime.    Yes [provider]  celecoxib (CELEBREX) 100 MG capsule Take 100 mg by mouth every 12 (twelve) hours as needed for pain. 08/11/19  Yes [provider]  Cholecalciferol (VITAMIN D3) 125 MCG (5000 UT) CAPS Take 5,000 Units by mouth daily.   Yes [provider]  diltiazem (CARDIZEM) 30 MG tablet Take 30 mg by mouth 3 (three) times daily.    Yes [provider]  docusate sodium (COLACE) 100 MG capsule Take 100 mg by mouth 2 (two) times daily.   Yes [provider]  DULoxetine (CYMBALTA) 60 MG capsule Take 60 mg by mouth daily.   Yes [provider]  feeding supplement, ENSURE ENLIVE, (ENSURE ENLIVE) LIQD Take 237 mLs by mouth 3 (three) times daily with meals. 10/14/18  Yes Seawell, Jaimie A, DO  ferrous sulfate 325 (65 FE) MG tablet Take 325 mg by mouth daily with breakfast.   Yes [provider]  folic acid (FOLVITE) 1 MG tablet Take 1 tablet (1 mg total) by mouth daily. 10/15/18  Yes Seawell, Jaimie A, DO  gabapentin (NEURONTIN) 100 MG capsule Take 1 capsule (100 mg total) by mouth  2 (two) times daily. 10/21/18  Yes Adonis Huguenin, NP  geriatric multivitamins-minerals (ELDERTONIC/GEVRABON) LIQD Take 15 mLs by mouth daily.   Yes [provider]  Ipratropium-Albuterol (COMBIVENT RESPIMAT) 20-100 MCG/ACT AERS respimat Inhale 1 puff into the lungs every 6 (six) hours as needed for shortness of breath.    Yes [provider]  ipratropium-albuterol (DUONEB) 0.5-2.5 (3) MG/3ML SOLN Take 3 mLs by nebulization every 6 (six) hours as needed (for wheezing or shortness of breath).   Yes [provider]  isosorbide dinitrate (ISORDIL) 30 MG tablet Take 30 mg by mouth 2 (two) times daily. 09/24/19  Yes [provider]  levETIRAcetam (KEPPRA) 500 MG tablet Take 500 mg by mouth 2 (two) times daily.   Yes [provider]  loratadine (CLARITIN) 10 MG tablet Take 10 mg by mouth daily.   Yes [provider]  Multiple Vitamin (MULTIVITAMIN WITH MINERALS) TABS tablet Take 1 tablet by mouth daily.   Yes [provider]  omeprazole (PRILOSEC) 40 MG capsule Take 1 capsule (40 mg total) by mouth 2 (two) times daily. 01/18/19  Yes Lynann Bologna, MD  oxyCODONE-acetaminophen (PERCOCET/ROXICET) 5-325 MG tablet Take 1 tablet by mouth every 6 (six) hours as needed for moderate pain. Patient taking differently: Take 1 tablet by mouth every 8 (eight) hours as needed for moderate pain or severe pain.  10/14/18  Yes Santos-Sanchez, Chelsea Primus, MD  OXYGEN Inhale 2 L/min into the lungs continuous.    Yes [provider]  polyethylene glycol powder (GLYCOLAX/MIRALAX) powder Take 17 g by mouth daily. 11/27/17  Yes Lynann Bologna, MD  promethazine (PHENERGAN) 25 MG tablet Take 25 mg by mouth every 4 (four) hours as needed for nausea or vomiting.   Yes [provider]  ranolazine (RANEXA) 500 MG 12 hr tablet Take 500 mg by mouth every 12 (twelve) hours.    Yes [provider]  thiamine 100 MG tablet Take 1 tablet (100 mg total) by mouth daily. 10/15/18  Yes Seawell, Jaimie A, DO  vitamin C (ASCORBIC ACID) 500 MG tablet Take 500 mg by mouth 2 (two) times daily.   Yes [provider]  melatonin 5 MG TABS Take 5 mg by mouth at bedtime. Patient not taking: Reported on 01/25/2020    [provider]  NON FORMULARY 2.5 mg every 4 (four) hours as needed. Albuterol Sulfate Nebulization Solution (2.5MG /3ML) 3 milliliter inhale orally via nebulizer every 4 hours as needed for Shortness of Breath Patient not taking: Reported on 01/25/2020    [provider]    Physical Exam:  Constitutional: Elderly female who appears to be in respiratory distress Vitals:   01/25/20 0815 01/25/20 0830 01/25/20 0915 01/25/20 1157  BP: 123/67 123/75 118/76   Pulse: (!) 109 (!) 111    Resp: 19 19 (!) 22   Temp:      TempSrc:      SpO2: 93% 92%  95%  Weight:      Height:        Eyes: PERRL, lids and conjunctivae normal ENMT: Mucous membranes are dry. Posterior pharynx clear of any exudate or lesions.  Neck: normal, supple, no masses, no thyromegaly Respiratory: Tachypneic with coarse breath sounds and wheezing appreciated.  Only able to talk in shortened 2-3 word sentences. Cardiovascular: Tachycardic, no murmurs / rubs / gallops. No significant extremity edema. 2+ pedal pulses. No carotid bruits.  Abdomen: no tenderness, no masses palpated. No hepatosplenomegaly. Bowel sounds positive.  Musculoskeletal: no clubbing /  cyanosis.  Left AKA.  Skin: no rashes, lesions, ulcers. No induration Neurologic: CN 2-12 grossly intact. Sensation intact, DTR normal. Strength 5/5 in all 4.  Psychiatric: Normal judgment and insight. Alert and oriented x person and place.  Anxious mood.      Labs on Admission: I have personally reviewed following labs and imaging studies  CBC: Recent Labs  Lab 01/25/20 0311 01/25/20 0342 01/25/20 0624  WBC 5.6  --   --   NEUTROABS 5.1  --   --   HGB 12.5 13.3 12.9  HCT 41.9 39.0 38.0  MCV 97.7  --   --   PLT 177  --   --    Basic Metabolic Panel: Recent Labs  Lab 01/25/20 0311 01/25/20 0342 01/25/20 0559 01/25/20 0624  NA 136 137  --  136  K 6.7* 6.4*  --  6.1*  CL 98  --   --   --   CO2 28  --   --   --   GLUCOSE 120*  --  120*  --   BUN 42*  --   --   --   CREATININE 1.35*  --   --   --   CALCIUM 9.0  --   --   --    GFR: Estimated Creatinine Clearance: 30.2 mL/min (A) (by C-G formula based on SCr of 1.35 mg/dL (H)). Liver Function Tests: Recent Labs  Lab 01/25/20 0311  AST 698*  ALT 289*  ALKPHOS 150*  BILITOT 1.2  PROT 7.9  ALBUMIN 2.8*   No results for input(s): LIPASE, AMYLASE in the last 168 hours. No results for input(s): AMMONIA in the last 168 hours. Coagulation Profile: Recent Labs  Lab 01/25/20 0311  INR 1.5*   Cardiac Enzymes: No results for input(s): CKTOTAL, CKMB, CKMBINDEX, TROPONINI in  the last 168 hours. BNP (last 3 results) No results for input(s): PROBNP in the last 8760 hours. HbA1C: No results for input(s): HGBA1C in the last 72 hours. CBG: Recent Labs  Lab 01/25/20 0945  GLUCAP 199*   Lipid Profile: No results for input(s): CHOL, HDL, LDLCALC, TRIG, CHOLHDL, LDLDIRECT in the last 72 hours. Thyroid Function Tests: No results for input(s): TSH, T4TOTAL, FREET4, T3FREE, THYROIDAB in the last 72 hours. Anemia Panel: No results for input(s): VITAMINB12, FOLATE, FERRITIN, TIBC, IRON, RETICCTPCT in the last 72 hours. Urine analysis:    Component Value Date/Time   COLORURINE YELLOW 10/05/2018 1945   APPEARANCEUR CLEAR 10/05/2018 1945   LABSPEC 1.016 10/05/2018 1945   PHURINE 7.0 10/05/2018 1945   GLUCOSEU NEGATIVE 10/05/2018 1945   HGBUR NEGATIVE 10/05/2018 1946   BILIRUBINUR NEGATIVE 10/05/2018 1946   KETONESUR 5 (A) 10/05/2018 1945   PROTEINUR NEGATIVE 10/05/2018 1946   NITRITE NEGATIVE 10/05/2018 1946   LEUKOCYTESUR TRACE (A) 10/05/2018 1945   Sepsis Labs: Recent Results (from the past 240 hour(s))  SARS Coronavirus 2 by RT PCR (hospital order, performed in [facility] hospital lab) Nasopharyngeal Nasopharyngeal Swab     Status: None   Collection Time: 01/25/20  3:26 AM   Specimen: Nasopharyngeal Swab  Result Value Ref Range Status   SARS Coronavirus 2 NEGATIVE NEGATIVE Final    Comment: (NOTE) SARS-CoV-2 target nucleic acids are NOT DETECTED.  The SARS-CoV-2 RNA is generally detectable in upper and lower respiratory specimens during the acute phase of infection. The lowest concentration of SARS-CoV-2 viral copies this assay can detect is 250 copies / mL. A negative result does not preclude SARS-CoV-2 infection and  should not be used as the sole basis for treatment or other patient management decisions.  A negative result may occur with improper specimen collection / handling, submission of specimen other than nasopharyngeal swab, presence of viral  mutation(s) within the areas targeted by this assay, and inadequate number of viral copies (<250 copies / mL). A negative result must be combined with clinical observations, patient history, and epidemiological information.  Fact Sheet for Patients:   BoilerBrush.com.cy  Fact Sheet for Healthcare Providers: https://pope.com/  This test is not yet approved or  cleared by the [place] FDA and has been authorized for detection and/or diagnosis of SARS-CoV-2 by FDA under an Emergency Use Authorization (EUA).  This EUA will remain in effect (meaning this test can be used) for the duration of the COVID-19 declaration under Section 564(b)(1) of the Act, 21 U.S.C. section 360bbb-3(b)(1), unless the authorization is terminated or revoked sooner.  Performed at [facility], [address], [place], [place] [address]      Radiological Exams on Admission: DG Chest Portable 1 View  Result Date: 01/25/2020 CLINICAL DATA:  Shortness of breath. EXAM: PORTABLE CHEST 1 VIEW COMPARISON:  10/13/2019, CT 08/07/2018 FINDINGS: Mild cardiomegaly. Unchanged mediastinal contours. Emphysema with interstitial coarsening. Bibasilar reticulation consistent with honeycombing. No acute or confluent airspace disease. No pleural effusion or pneumothorax. Bones are under mineralized. No acute osseous abnormalities are seen. IMPRESSION: 1. Cardiomegaly. 2. Emphysema.  Basilar honeycombing/fibrosis. Electronically Signed   By: Narda Rutherford M.D.   On: 01/25/2020 03:10    EKG: Independently reviewed.  Sinus tachycardia 104 bpm  Assessment/Plan Acute on chronic respiratory failure with hypoxia and hypercapnia COPD and pulmonary fibrosis: Patient presented from the nursing facility after calling out for shortness of breath.  Normally on 3 L of oxygen at baseline.  Found to be hypoxic into the 70s on room air.  On physical exam patient found to be actively  wheezing. -Admit to a progressive bed -Continuous pulse oximetry to maintain O2 saturations greater than 90% -Continue DuoNebs every 4 hours -Solu-Medrol 60 mg twice daily  -BiPAP as needed  Acute kidney injury: Patient's baseline creatinine previously have been around 0.6, but presents with creatinine elevated up to 1.35 with BUN 40.  Suspect prerenal cause of symptoms, but with elevated BNP unclear. -Check urinalysis -Check CK -Urine creatinine, urine sodium, and urine urea -Normal saline IV fluids at 50 mL/h -Continue kidney function daily  Transaminitis: Acute.  On admission patient was found to have alkaline phosphatase 150, AST 698, and ALT 389.   -Follow-up liver ultrasound -Continue to monitor liver function studies  Elevated BNP/elevated troponins: Acute.  On admission patient was noted to have a BNP of 1062.7 and troponin 49->51.  Unclear if the elevated BNP is secondary to patient's lung issues she does not appear grossly fluid overloaded.  She denied any complaints of chest pain.  Last echocardiogram revealed EF of approximately 55-60% back in 09/2018. -Check echocardiogram  Fall: Patient reports that she fell out of bed couple days ago.  Nursing facility did not report any fall.   GERD/history of dysphagia secondary to esophageal stricture -Continue pharmacy substitution of Protonix  -[facility] consulted, we will follow-up for any further recommendations  Hyperkalemia: Resolved.  On admission potassium was initially 6.7.  Patient had been given Lokema  -Continue to monitor  History of rheumatoid arthritis: Patient on Abatacept weekly. -Continue to monitor  Essential hypertension: Blood pressures currently maintained. -Continue Cardizem p.o. when able  Vascular dementia  with anxiety: Patient appears to be alert and oriented to person and place at this time.Home medications include Xanax 0.25 mg twice daily and Cymbalta 60 mg daily. -Continue current home regimen  unable  Left above-knee amputation: Secondary to history of osteomyelitis and gangrene. -Up with assistance  Hyperlipidemia -Hold atorvastatin due to transaminitis  Seizure disorder -Continue Keppra   DNR: Present on admission  DVT prophylaxis: Lovenox Code Status: DNR Family Communication: Plan of care discussed with daughter over the phone Disposition Plan: Likely discharge back to skilled nursing facility once medically stable Consults called: PCCM Admission status: Inpatient   Clydie Braun MD [facility] Pager [phone]   If 7PM-7AM, please contact night-coverage [email or web address] Password [facility]  01/25/2020, 12:46 PM

## 2020-01-25 NOTE — ED Triage Notes (Signed)
BIB EMS form [facility], called out for SOB. Patient noted to have low O2 sats mid 70's RA. Placed on NRB, sats 90's. Patient is altered.

## 2020-01-25 NOTE — Telephone Encounter (Signed)
Spoke with nurse, Blain Pais at [facility] regarding EGD/DIL at [facility] for 02/03/20. She states patient was taken to the hospital this morning.  Patient is being admitted for shortness of breath.  I spoke with daughter, Whitney Steele to let her know that we will place patient on waiting list to have procedure done.

## 2020-01-25 NOTE — ED Notes (Addendum)
Nonviolent restraints applied. Sitter ordered.

## 2020-01-25 NOTE — ED Notes (Signed)
PT on Venturi. Pt pulled off mask and desat to 60's. NRB applied. RT notified.

## 2020-01-25 NOTE — Consult Note (Signed)
NAME:  Whitney Steele, MRN:  [id number], DOB:  [birth year], LOS: 0 ADMISSION DATE:  01/25/2020, CONSULTATION DATE: 01/25/2020 REFERRING MD: Emergency department physician, CHIEF COMPLAINT: Shortness of breath hyperkalemia  Brief History   74 year old female nursing home resident dementia with normally O2 dependent secondary to COPD pulmonary fibrosis now with increasing shortness of breath and hyperkalemia.  History of present illness   74 year old female who resides in a skilled nursing facility with extensive past medical history consisting of dementia, pulmonary fibrosis current usually on 2 to 3 L nasal cannula.  She is reported to be short of breath EMS evaluated her and found her sats to be 70% and felt she was cyanotic.  She is transferred to [facility] emergency department with a potassium 6.4 and creatinine 1.35.  BNP was noted to be greater than thousand.  She was placed on BiPAP and deescalated to 50% facemask.  Pulmonary critical care called to evaluate.  Past Medical History   Past Medical History:  Diagnosis Date  . Abnormal AST and ALT   . Allergic rhinitis   . Anemia   . Anxiety   . Atherosclerotic heart disease of native coronary artery with unspecified angina pectoris (HCC)   . Benign essential hypertension   . 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 COPD (chronic obstructive pulmonary disease) (HCC)   . Depression with anxiety   . Dysphagia   . Foley catheter in place   . GERD (gastroesophageal reflux disease)   . Hypercholesterolemia   . Hypertension   . Hypothyroidism   . Insomnia   . Neuromuscular dysfunction of bladder, unspecified   . Osteoporosis   . Other osteoporosis without current pathological fracture   . Other seizures (HCC)   . Pulmonary fibrosis (HCC)   . Rheumatoid arthritis (HCC)   . Unspecified protein-calorie malnutrition (HCC)   . Unstageable pressure ulcer of sacral region (HCC)   . Vascular dementia without  behavioral disturbance (HCC)      Significant Hospital Events     Consults:  01/25/2020 pulmonary critical care  Procedures:    Significant Diagnostic Tests:    Micro Data:    Antimicrobials:    Interim history/subjective:  74 year old female who is in no acute distress at rest  Objective   Blood pressure 124/73, pulse (!) 110, temperature 97.9 F (36.6 C), temperature source Oral, resp. rate 16, height 5\' 6"  (1.676 m), weight 50.8 kg, SpO2 92 %.    Vent Mode: PCV;BIPAP FiO2 (%):  [50 %-60 %] 50 % Set Rate:  [12 bmp] 12 bmp PEEP:  [8 cmH20] 8 cmH20  No intake or output data in the 24 hours ending 01/25/20 0847 Filed Weights   01/25/20 0253  Weight: 50.8 kg    Examination: General: Disheveled female who is currently on 50% mask with O2 saturations of 95% HENT: No JVD or lymphadenopathy is appreciated Lungs: Decreased breath sounds in the bases Cardiovascular: Heart sounds are distant Abdomen: Mild tender positive bowel sounds Extremities: Old left AKA noted right lower extremity unremarkable Neuro: Confused but follows commands   Resolved Hospital Problem list     Assessment & Plan:  Acute on chronic hypercarbic hypoxic respiratory failure in the setting of hiatal hernia, esophageal stricture complicated by narcotics and benzodiazepines, further complicated by history of COPD emphysema remote tobacco abuse. Wean FiO2 as needed Avoid sedation as possible N.p.o. for now Consider GI consult is followed by [facility]     hyperkalemia Recent  Labs  Lab 01/25/20 0311 01/25/20 0342 01/25/20 0624  K 6.7* 6.4* 6.1*   Lab Results  Component Value Date   CREATININE 1.35 (H) 01/25/2020   CREATININE 0.67 10/13/2019   CREATININE 0.63 10/13/2018   Status post insulin and dextrose Lokelma given Repeat potassium level     Coronary artery disease with elevated BNP 1062.7 Continue to monitor  Dementia Continue current medications  Peripheral vascular  disease status post left AKA No current interventions required      Best practice:  Diet: npo Pain/Anxiety/Delirium protocol (if indicated): as indicated VAP protocol (if indicated): as needed DVT prophylaxis: heparin GI prophylaxis: [[i Glucose control: ssi Mobility: bed rest Code Status: fullo Family Communication: no family at bedside Disposition: sdu  Labs   CBC: Recent Labs  Lab 01/25/20 0311 01/25/20 0342 01/25/20 0624  WBC 5.6  --   --   NEUTROABS 5.1  --   --   HGB 12.5 13.3 12.9  HCT 41.9 39.0 38.0  MCV 97.7  --   --   PLT 177  --   --     Basic Metabolic Panel: Recent Labs  Lab 01/25/20 0311 01/25/20 0342 01/25/20 0559 01/25/20 0624  NA 136 137  --  136  K 6.7* 6.4*  --  6.1*  CL 98  --   --   --   CO2 28  --   --   --   GLUCOSE 120*  --  120*  --   BUN 42*  --   --   --   CREATININE 1.35*  --   --   --   CALCIUM 9.0  --   --   --    GFR: Estimated Creatinine Clearance: 30.2 mL/min (A) (by C-G formula based on SCr of 1.35 mg/dL (H)). Recent Labs  Lab 01/25/20 0311 01/25/20 0558  WBC 5.6  --   LATICACIDVEN 1.3 1.2    Liver Function Tests: Recent Labs  Lab 01/25/20 0311  AST 698*  ALT 289*  ALKPHOS 150*  BILITOT 1.2  PROT 7.9  ALBUMIN 2.8*   No results for input(s): LIPASE, AMYLASE in the last 168 hours. No results for input(s): AMMONIA in the last 168 hours.  ABG    Component Value Date/Time   PHART 7.374 01/25/2020 0624   PCO2ART 55.2 (H) 01/25/2020 0624   PO2ART 60 (L) 01/25/2020 0624   HCO3 32.4 (H) 01/25/2020 0624   TCO2 34 (H) 01/25/2020 0624   O2SAT 90.0 01/25/2020 0624     Coagulation Profile: Recent Labs  Lab 01/25/20 0311  INR 1.5*    Cardiac Enzymes: No results for input(s): CKTOTAL, CKMB, CKMBINDEX, TROPONINI in the last 168 hours.  HbA1C: No results found for: HGBA1C  CBG: No results for input(s): GLUCAP in the last 168 hours.  Review of Systems:     Past Medical History  She,  has a past  medical history of Abnormal AST and ALT, Allergic rhinitis, Anemia, Anxiety, Atherosclerotic heart disease of native coronary artery with unspecified angina pectoris (HCC), Benign essential hypertension, Chronic osteomyelitis of left tibia with draining sinus (HCC), COPD (chronic obstructive pulmonary disease) (HCC), Depression with anxiety, Dysphagia, Foley catheter in place, GERD (gastroesophageal reflux disease), Hypercholesterolemia, Hypertension, Hypothyroidism, Insomnia, Neuromuscular dysfunction of bladder, unspecified, Osteoporosis, Other osteoporosis without current pathological fracture, Other seizures (HCC), Pulmonary fibrosis (HCC), Rheumatoid arthritis (HCC), Unspecified protein-calorie malnutrition (HCC), Unstageable pressure ulcer of sacral region [facility]), and Vascular dementia without behavioral disturbance (HCC).   Surgical History  Past Surgical History:  Procedure Laterality Date  . ABDOMINAL HYSTERECTOMY    . AMPUTATION Left 10/09/2018   Procedure: LEFT ABOVE KNEE AMPUTATION;  Surgeon: Nadara Mustard, MD;  Location: [facility] OR;  Service: Orthopedics;  Laterality: Left;  . 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 CARPAL TUNNEL RELEASE    . ESOPHAGOGASTRODUODENOSCOPY  09/12/2016   Distal esophageal Schatzkis ring. Moderate hh. Highly tourturous esophagus. Mild esophageal diverticulum. Superficial gastric ulcer. Esophagus Bx: benign squamoglandular mucosa with chronic inflammation and squamous hyperplasia suggestive of gastroesophageal reflux disease. Stomach Bx: Ulceration and reactive gastropthy.  . ESOPHAGOGASTRODUODENOSCOPY  09/12/2016   Distal esophageal Schatzkis ring status post esophageal dilitation. Moderate hiatal hernia. Highly torturous esophagus suggestive of esophageal dysmotility/presbyesophagus. Mid esophageal diverticulum. Superficial gastric ulcer.   . INCISIONAL HERNIA REPAIR    . LAPAROSCOPIC CHOLECYSTECTOMY    . OVARY SURGERY     removal of ovarian masses      Social History   reports that she has never smoked. She has never used smokeless tobacco. She reports previous alcohol use. She reports that she does not use drugs.   Family History   Her Family history is unknown by patient.   Allergies Allergies  Allergen Reactions  . Iodinated Diagnostic Agents Hives  . Tape Other (See Comments)    Tears skin - please use paper tape ("allergic," per paperwork)     Home Medications  Prior to Admission medications   Medication Sig Start Date End Date Taking? Authorizing Provider  Abatacept (ORENCIA) 125 MG/ML SOSY Inject 125 mg into the skin every Thursday.   Yes [provider]  acetaminophen (TYLENOL) 325 MG tablet Take 650 mg by mouth every 6 (six) hours as needed for mild pain or fever.   Yes [provider]  albuterol (PROVENTIL) (2.5 MG/3ML) 0.083% nebulizer solution Take 2.5 mg by nebulization every 4 (four) hours as needed for shortness of breath.   Yes [provider]  albuterol (VENTOLIN HFA) 108 (90 Base) MCG/ACT inhaler Inhale 2 puffs into the lungs every 6 (six) hours as needed for wheezing or shortness of breath.   Yes [provider]  ALPRAZolam (XANAX) 0.25 MG tablet Take 0.25 mg by mouth 2 (two) times daily.  09/17/19  Yes [provider]  aspirin EC 81 MG tablet Take 81 mg by mouth daily.    Yes [provider]  atorvastatin (LIPITOR) 20 MG tablet Take 20 mg by mouth at bedtime.    Yes [provider]  celecoxib (CELEBREX) 100 MG capsule Take 100 mg by mouth every 12 (twelve) hours as needed for pain. 08/11/19  Yes [provider]  Cholecalciferol (VITAMIN D3) 125 MCG (5000 UT) CAPS Take 5,000 Units by mouth daily.   Yes [provider]  diltiazem (CARDIZEM) 30 MG tablet Take 30 mg by mouth 3 (three) times daily.    Yes [provider]  docusate sodium (COLACE) 100 MG capsule Take 100 mg by mouth 2 (two) times daily.   Yes [provider]  DULoxetine (CYMBALTA) 60 MG capsule Take 60 mg by mouth daily.   Yes [provider]  feeding supplement, ENSURE ENLIVE, (ENSURE ENLIVE) LIQD Take 237 mLs by mouth 3 (three) times daily with meals. 10/14/18  Yes Seawell, Jaimie A, DO  ferrous sulfate 325 (65 FE) MG tablet Take 325 mg by mouth daily with breakfast.   Yes [provider]  folic acid (FOLVITE) 1 MG tablet Take 1  tablet (1 mg total) by mouth daily. 10/15/18  Yes Seawell, Jaimie A, DO  gabapentin (NEURONTIN) 100 MG capsule Take 1 capsule (100 mg total) by mouth 2 (two) times daily. 10/21/18  Yes Adonis Huguenin, NP  geriatric multivitamins-minerals (ELDERTONIC/GEVRABON) LIQD Take 15 mLs by mouth daily.   Yes [provider]  Ipratropium-Albuterol (COMBIVENT RESPIMAT) 20-100 MCG/ACT AERS respimat Inhale 1 puff into the lungs every 6 (six) hours as needed for shortness of breath.    Yes [provider]  ipratropium-albuterol (DUONEB) 0.5-2.5 (3) MG/3ML SOLN Take 3 mLs by nebulization every 6 (six) hours as needed (for wheezing or shortness of breath).   Yes [provider]  isosorbide dinitrate (ISORDIL) 30 MG tablet Take 30 mg by mouth 2 (two) times daily. 09/24/19  Yes [provider]  levETIRAcetam (KEPPRA) 500 MG tablet Take 500 mg by mouth 2 (two) times daily.   Yes [provider]  loratadine (CLARITIN) 10 MG tablet Take 10 mg by mouth daily.   Yes [provider]  Multiple Vitamin (MULTIVITAMIN WITH MINERALS) TABS tablet Take 1 tablet by mouth daily.   Yes [provider]  omeprazole (PRILOSEC) 40 MG capsule Take 1 capsule (40 mg total) by mouth 2 (two) times daily. 01/18/19  Yes Lynann Bologna, MD  oxyCODONE-acetaminophen (PERCOCET/ROXICET) 5-325 MG tablet Take 1 tablet by mouth every 6 (six) hours as needed for moderate pain. Patient taking differently: Take 1 tablet by mouth every 8 (eight) hours as needed for moderate pain or severe pain.  10/14/18  Yes  Santos-Sanchez, Chelsea Primus, MD  OXYGEN Inhale 2 L/min into the lungs continuous.    Yes [provider]  polyethylene glycol powder (GLYCOLAX/MIRALAX) powder Take 17 g by mouth daily. 11/27/17  Yes Lynann Bologna, MD  promethazine (PHENERGAN) 25 MG tablet Take 25 mg by mouth every 4 (four) hours as needed for nausea or vomiting.   Yes [provider]  ranolazine (RANEXA) 500 MG 12 hr tablet Take 500 mg by mouth every 12 (twelve) hours.    Yes [provider]  thiamine 100 MG tablet Take 1 tablet (100 mg total) by mouth daily. 10/15/18  Yes Seawell, Jaimie A, DO  vitamin C (ASCORBIC ACID) 500 MG tablet Take 500 mg by mouth 2 (two) times daily.   Yes [provider]  melatonin 5 MG TABS Take 5 mg by mouth at bedtime. Patient not taking: Reported on 01/25/2020    [provider]  NON FORMULARY 2.5 mg every 4 (four) hours as needed. Albuterol Sulfate Nebulization Solution (2.5MG /3ML) 3 milliliter inhale orally via nebulizer every 4 hours as needed for Shortness of Breath Patient not taking: Reported on 01/25/2020    [provider]     Critical care time: 45 min    Brett Canales Babacar Haycraft ACNP Acute Care Nurse Practitioner [facility] Pulmonary/Critical Care Please consult Amion 01/25/2020, 8:47 AM

## 2020-01-25 NOTE — ED Provider Notes (Signed)
[name] EMERGENCY DEPARTMENT Provider Note   CSN: [id number] Arrival date & time: 01/25/20  0254     History Chief Complaint  Patient presents with  . Shortness of Breath    Whitney Steele is a 74 y.o. female.  Patient with PMH of dementia, pulmonary fibrosis, RA, HTN and more as listed below presents to the ED with chief complaint of hypoxia.  She is from [place] (for rehab from recent hip fx), who sent patient to ER for O2 sat of 70% and being cyanotic.  Unable to obtain any history from patient due to confusion.  Per daughter, patient normally very alert, conversational.  Does wear 3-4L [place] 24/7.  Had 1st dose covid vaccine last week.  6:02 AM Spoke with daughter again notifying of admission.  Daughter says mother has never had problems retaining CO2.  Additionally, hip fx occurred in spring of 2020 and has been being managed non-operatively.  The history is provided by the patient. No language interpreter was used.       Past Medical History:  Diagnosis Date  . Abnormal AST and ALT   . Allergic rhinitis   . Anemia   . Anxiety   . Atherosclerotic heart disease of native coronary artery with unspecified angina pectoris (HCC)   . Benign essential hypertension   . 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 COPD (chronic obstructive pulmonary disease) (HCC)   . Depression with anxiety   . Dysphagia   . Foley catheter in place   . GERD (gastroesophageal reflux disease)   . Hypercholesterolemia   . Hypertension   . Hypothyroidism   . Insomnia   . Neuromuscular dysfunction of bladder, unspecified   . Osteoporosis   . Other osteoporosis without current pathological fracture   . Other seizures (HCC)   . Pulmonary fibrosis (HCC)   . Rheumatoid arthritis (HCC)   . Unspecified protein-calorie malnutrition (HCC)   . Unstageable pressure ulcer of sacral region (HCC)   . Vascular dementia without behavioral  disturbance [facility])     Patient Active Problem List   Diagnosis Date Noted  . Confusion   . Unilateral AKA, left (HCC)   . Sacral ulcer (HCC) 10/06/2018  . Cellulitis of left lower extremity   . Altered mental status 10/05/2018    Past Surgical History:  Procedure Laterality Date  . ABDOMINAL HYSTERECTOMY    . AMPUTATION Left 10/09/2018   Procedure: LEFT ABOVE KNEE AMPUTATION;  Surgeon: Nadara Mustard, MD;  Location: [facility] OR;  Service: Orthopedics;  Laterality: Left;  . 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 CARPAL TUNNEL RELEASE    . ESOPHAGOGASTRODUODENOSCOPY  09/12/2016   Distal esophageal Schatzkis ring. Moderate hh. Highly tourturous esophagus. Mild esophageal diverticulum. Superficial gastric ulcer. Esophagus Bx: benign squamoglandular mucosa with chronic inflammation and squamous hyperplasia suggestive of gastroesophageal reflux disease. Stomach Bx: Ulceration and reactive gastropthy.  . ESOPHAGOGASTRODUODENOSCOPY  09/12/2016   Distal esophageal Schatzkis ring status post esophageal dilitation. Moderate hiatal hernia. Highly torturous esophagus suggestive of esophageal dysmotility/presbyesophagus. Mid esophageal diverticulum. Superficial gastric ulcer.   . INCISIONAL HERNIA REPAIR    . LAPAROSCOPIC CHOLECYSTECTOMY    . OVARY SURGERY     removal of ovarian masses     OB History   No obstetric history on file.     Family History  Family history unknown: Yes    Social History   Tobacco Use  . Smoking  status: Never Smoker  . Smokeless tobacco: Never Used  Vaping Use  . Vaping Use: Never used  Substance Use Topics  . Alcohol use: Not Currently  . Drug use: Never    Home Medications Prior to Admission medications   Medication Sig Start Date End Date Taking? Authorizing Provider  Abatacept (ORENCIA) 125 MG/ML SOSY Inject 125 mg into the skin every Thursday.    [provider]  acetaminophen (TYLENOL) 325 MG tablet Take 650 mg by mouth every 6  (six) hours as needed for mild pain or fever.    [provider]  albuterol (VENTOLIN HFA) 108 (90 Base) MCG/ACT inhaler Inhale 2 puffs into the lungs every 6 (six) hours as needed for wheezing or shortness of breath.    [provider]  ALPRAZolam Prudy Feeler) 0.25 MG tablet Take 0.25 mg by mouth 2 (two) times daily.  09/17/19   [provider]  aspirin EC 81 MG tablet Take 81 mg by mouth daily.     [provider]  atorvastatin (LIPITOR) 20 MG tablet Take 20 mg by mouth at bedtime.     [provider]  celecoxib (CELEBREX) 100 MG capsule Take 100 mg by mouth every 12 (twelve) hours as needed for pain. 08/11/19   [provider]  Cholecalciferol (VITAMIN D3) 125 MCG (5000 UT) CAPS Take 5,000 Units by mouth daily.    [provider]  diltiazem (CARDIZEM) 30 MG tablet Take 30 mg by mouth 3 (three) times daily.     [provider]  docusate sodium (COLACE) 100 MG capsule Take 100 mg by mouth 2 (two) times daily.    [provider]  DULoxetine (CYMBALTA) 60 MG capsule Take 60 mg by mouth daily.    [provider]  feeding supplement, ENSURE ENLIVE, (ENSURE ENLIVE) LIQD Take 237 mLs by mouth 3 (three) times daily with meals. 10/14/18   Seawell, Jaimie A, DO  ferrous sulfate 325 (65 FE) MG tablet Take 325 mg by mouth daily with breakfast.    [provider]  folic acid (FOLVITE) 1 MG tablet Take 1 tablet (1 mg total) by mouth daily. 10/15/18   Seawell, Jaimie A, DO  gabapentin (NEURONTIN) 100 MG capsule Take 1 capsule (100 mg total) by mouth 2 (two) times daily. 10/21/18   Adonis Huguenin, NP  geriatric multivitamins-minerals (ELDERTONIC/GEVRABON) LIQD Take 15 mLs by mouth daily.    [provider]  Ipratropium-Albuterol (COMBIVENT RESPIMAT) 20-100 MCG/ACT AERS respimat Inhale 1 puff into the lungs every 6 (six) hours as needed for wheezing or shortness of breath.    [provider]  isosorbide dinitrate  (ISORDIL) 30 MG tablet Take 30 mg by mouth 2 (two) times daily. 09/24/19   [provider]  levETIRAcetam (KEPPRA) 500 MG tablet Take 500 mg by mouth 2 (two) times daily.    [provider]  loratadine (CLARITIN) 10 MG tablet Take 10 mg by mouth daily.    [provider]  melatonin 5 MG TABS Take 5 mg by mouth at bedtime.    [provider]  Multiple Vitamin (MULTIVITAMIN WITH MINERALS) TABS tablet Take 1 tablet by mouth daily.    [provider]  NON FORMULARY 2.5 mg every 4 (four) hours as needed. Albuterol Sulfate Nebulization Solution (2.5MG /3ML) 3 milliliter inhale orally via nebulizer every 4 hours as needed for Shortness of Breath    [provider]  omeprazole (PRILOSEC) 40 MG capsule Take 1 capsule (40 mg total) by mouth  2 (two) times daily. 01/18/19   Lynann Bologna, MD  oxyCODONE-acetaminophen (PERCOCET/ROXICET) 5-325 MG tablet Take 1 tablet by mouth every 6 (six) hours as needed for moderate pain. Patient taking differently: Take 1 tablet by mouth every 8 (eight) hours as needed for moderate pain.  10/14/18   Burna Cash, MD  OXYGEN Inhale 2 L into the lungs continuous.    [provider]  polyethylene glycol powder (GLYCOLAX/MIRALAX) powder Take 17 g by mouth daily. 11/27/17   Lynann Bologna, MD  promethazine (PHENERGAN) 25 MG tablet Take 25 mg by mouth every 4 (four) hours as needed for nausea or vomiting.    [provider]  ranolazine (RANEXA) 500 MG 12 hr tablet Take 500 mg by mouth every 12 (twelve) hours.     [provider]  thiamine 100 MG tablet Take 1 tablet (100 mg total) by mouth daily. 10/15/18   Seawell, Jaimie A, DO  vitamin C (ASCORBIC ACID) 500 MG tablet Take 500 mg by mouth 2 (two) times daily.    [provider]    Allergies    Iodinated diagnostic agents and Tape  Review of Systems   Review of Systems  Unable to perform ROS: Dementia    Physical Exam Updated Vital  Signs BP (!) 141/85 (BP Location: Left Arm)   Pulse (!) 103   Temp 97.9 F (36.6 C) (Oral)   Resp 18   Ht 5\' 6"  (1.676 m)   Wt 50.8 kg   SpO2 (!) 89%   BMI 18.08 kg/m   Physical Exam Vitals and nursing note reviewed.  Constitutional:      General: She is not in acute distress.    Appearance: She is well-developed.     Comments: Awake  HENT:     Head: Normocephalic and atraumatic.  Eyes:     Conjunctiva/sclera: Conjunctivae normal.  Cardiovascular:     Rate and Rhythm: Normal rate and regular rhythm.     Heart sounds: No murmur heard.   Pulmonary:     Effort: Respiratory distress present.     Breath sounds: Normal breath sounds.     Comments: Mild respiratory distress Abdominal:     Palpations: Abdomen is soft.     Tenderness: There is no abdominal tenderness.  Musculoskeletal:        General: Normal range of motion.     Cervical back: Neck supple.     Comments: Left AKA Moves extremities  Skin:    General: Skin is warm and dry.  Neurological:     Mental Status: She is disoriented.     Comments: Alert to name and city  Psychiatric:     Comments: Pleasantly demented     ED Results / Procedures / Treatments   Labs (all labs ordered are listed, but only abnormal results are displayed) Labs Reviewed  CULTURE, BLOOD (ROUTINE X 2)  CULTURE, BLOOD (ROUTINE X 2)  SARS CORONAVIRUS 2 BY RT PCR (HOSPITAL ORDER, PERFORMED IN [place] [facility])  COMPREHENSIVE METABOLIC PANEL  LACTIC ACID, PLASMA  LACTIC ACID, PLASMA  CBC WITH DIFFERENTIAL/PLATELET  PROTIME-INR  URINALYSIS, ROUTINE W REFLEX MICROSCOPIC  BLOOD GAS, ARTERIAL    EKG EKG Interpretation  Date/Time:  Tuesday January 25 2020 03:07:34 EDT Ventricular Rate:  104 PR Interval:    QRS Duration: 119 QT Interval:  358 QTC Calculation: 471 R Axis:   143 Text Interpretation: Sinus tachycardia Atrial premature complex Nonspecific intraventricular conduction delay Confirmed by Ross Marcus ([id number])  on 01/25/2020  4:03:04 AM   Radiology DG Chest Portable 1 View  Result Date: 01/25/2020 CLINICAL DATA:  Shortness of breath. EXAM: PORTABLE CHEST 1 VIEW COMPARISON:  10/13/2019, CT 08/07/2018 FINDINGS: Mild cardiomegaly. Unchanged mediastinal contours. Emphysema with interstitial coarsening. Bibasilar reticulation consistent with honeycombing. No acute or confluent airspace disease. No pleural effusion or pneumothorax. Bones are under mineralized. No acute osseous abnormalities are seen. IMPRESSION: 1. Cardiomegaly. 2. Emphysema.  Basilar honeycombing/fibrosis. Electronically Signed   By: Narda Rutherford M.D.   On: 01/25/2020 03:10    Procedures Procedures (including critical care time)  Medications Ordered in ED Medications - No data to display  ED Course  I have reviewed the triage vital signs and the nursing notes.  Pertinent labs & imaging results that were available during my care of the patient were reviewed by me and considered in my medical decision making (see chart for details).    MDM Rules/Calculators/A&P                          This patient complains of hypoxia, this involves an extensive number of treatment options, and is a complaint that carries with it a high risk of complications and morbidity.  Differential includes sepsis, PE, ACS, COPD exacerbation, CHF.  Pertinent Labs I ordered, reviewed, and interpreted labs, which included ABG notable for pH of 7.25, PCO2 73.1, potassium 6.4.  Troponin mildly elevated at 49.  BNP 1062.  Covid test pending.  Creatinine elevated at 1.35, up from baseline of about 0.6.  AST and ALT elevated at 698 and 289 respectively.  Denies alcohol use.  Will check acetaminophen level.  Lactic acid level normal.  No leukocytosis.  Imaging Interpretation I ordered imaging studies which included chest x-ray.  I independently visualized and interpreted the chest x-ray, which showed cardiomegaly, no acute infiltrates..   Sources Additional  history obtained from daughter.   Consultants Dr. Toniann Fail - Who asks for case to be discussed with intensivist given multiple system involvement. Dr. Arsenio Loader - Intensivist, who will have the patient seen by the ground team.  Critical Interventions  BiPAP  Reassessments After the interventions stated above, I reevaluated the patient and found breathing more comfortably on bipap.  Plan We will plan for admission.  Patient will be seen by critical care.  I have lower suspicion for PE given that hip fracture was not in May of this year, but was in May of last year.  I clarified this with the daughter.  He was also managed nonoperatively.  Cannot get a PE study given contrast allergy.  Final Clinical Impression(s) / ED Diagnoses Final diagnoses:  Hypoxia    Rx / DC Orders ED Discharge Orders    None       Roxy Horseman, PA-C 01/25/20 1610    Shon Baton, MD 01/25/20 [phone]

## 2020-01-26 ENCOUNTER — Inpatient Hospital Stay (HOSPITAL_COMMUNITY): Payer: Medicare Other

## 2020-01-26 DIAGNOSIS — R945 Abnormal results of liver function studies: Secondary | ICD-10-CM | POA: Diagnosis not present

## 2020-01-26 DIAGNOSIS — J9601 Acute respiratory failure with hypoxia: Secondary | ICD-10-CM | POA: Diagnosis not present

## 2020-01-26 DIAGNOSIS — I351 Nonrheumatic aortic (valve) insufficiency: Secondary | ICD-10-CM

## 2020-01-26 DIAGNOSIS — J9602 Acute respiratory failure with hypercapnia: Secondary | ICD-10-CM | POA: Diagnosis not present

## 2020-01-26 DIAGNOSIS — I362 Nonrheumatic tricuspid (valve) stenosis with insufficiency: Secondary | ICD-10-CM

## 2020-01-26 DIAGNOSIS — I340 Nonrheumatic mitral (valve) insufficiency: Secondary | ICD-10-CM

## 2020-01-26 DIAGNOSIS — R1319 Other dysphagia: Secondary | ICD-10-CM | POA: Diagnosis not present

## 2020-01-26 DIAGNOSIS — W19XXXA Unspecified fall, initial encounter: Secondary | ICD-10-CM

## 2020-01-26 LAB — CBC
HCT: 42.4 % (ref 36.0–46.0)
Hemoglobin: 12.8 g/dL (ref 12.0–15.0)
MCH: 28.6 pg (ref 26.0–34.0)
MCHC: 30.2 g/dL (ref 30.0–36.0)
MCV: 94.9 fL (ref 80.0–100.0)
Platelets: 178 10*3/uL (ref 150–400)
RBC: 4.47 MIL/uL (ref 3.87–5.11)
RDW: 15.5 % (ref 11.5–15.5)
WBC: 8.5 10*3/uL (ref 4.0–10.5)
nRBC: 0 % (ref 0.0–0.2)

## 2020-01-26 LAB — COMPREHENSIVE METABOLIC PANEL
ALT: 311 U/L — ABNORMAL HIGH (ref 0–44)
AST: 411 U/L — ABNORMAL HIGH (ref 15–41)
Albumin: 2.6 g/dL — ABNORMAL LOW (ref 3.5–5.0)
Alkaline Phosphatase: 122 U/L (ref 38–126)
Anion gap: 10 (ref 5–15)
BUN: 32 mg/dL — ABNORMAL HIGH (ref 8–23)
CO2: 29 mmol/L (ref 22–32)
Calcium: 8.8 mg/dL — ABNORMAL LOW (ref 8.9–10.3)
Chloride: 97 mmol/L — ABNORMAL LOW (ref 98–111)
Creatinine, Ser: 0.86 mg/dL (ref 0.44–1.00)
GFR calc Af Amer: >60 mL/min (ref >60)
GFR calc non Af Amer: >60 mL/min (ref >60)
Glucose, Bld: 129 mg/dL — ABNORMAL HIGH (ref 70–99)
Potassium: 5.6 mmol/L — ABNORMAL HIGH (ref 3.5–5.1)
Sodium: 136 mmol/L (ref 135–145)
Total Bilirubin: 1 mg/dL (ref 0.3–1.2)
Total Protein: 7.3 g/dL (ref 6.5–8.1)

## 2020-01-26 LAB — HEPATITIS PANEL, ACUTE
HCV Ab: NONREACTIVE
Hep A IgM: NONREACTIVE
Hep B C IgM: NONREACTIVE
Hepatitis B Surface Ag: NONREACTIVE

## 2020-01-26 LAB — TSH: TSH: 0.575 u[IU]/mL (ref 0.350–4.500)

## 2020-01-26 LAB — MAGNESIUM: Magnesium: 1.7 mg/dL (ref 1.7–2.4)

## 2020-01-26 LAB — CBG MONITORING, ED
Glucose-Capillary: 120 mg/dL — ABNORMAL HIGH (ref 70–99)
Glucose-Capillary: 117 mg/dL — ABNORMAL HIGH (ref 70–99)

## 2020-01-26 LAB — ECHOCARDIOGRAM COMPLETE
Height: 66 in
S' Lateral: 2.8 cm
Weight: 1791.9 oz

## 2020-01-26 LAB — PHOSPHORUS: Phosphorus: 3.4 mg/dL (ref 2.5–4.6)

## 2020-01-26 MED ORDER — FUROSEMIDE 10 MG/ML IJ SOLN
20.0000 mg | Freq: Once | INTRAMUSCULAR | Status: AC
  Administered 2020-01-26: 20 mg via INTRAVENOUS
  Filled 2020-01-26: qty 2

## 2020-01-26 MED ORDER — IPRATROPIUM-ALBUTEROL 0.5-2.5 (3) MG/3ML IN SOLN
3.0000 mL | Freq: Two times a day (BID) | RESPIRATORY_TRACT | Status: DC
  Administered 2020-01-26 – 2020-01-27 (×2): 3 mL via RESPIRATORY_TRACT
  Filled 2020-01-26 (×2): qty 3

## 2020-01-26 MED ORDER — DIGOXIN 125 MCG PO TABS
0.1250 mg | ORAL_TABLET | ORAL | Status: DC
  Administered 2020-01-26 – 2020-02-02 (×4): 0.125 mg via ORAL
  Filled 2020-01-26 (×7): qty 1

## 2020-01-26 MED ORDER — SODIUM ZIRCONIUM CYCLOSILICATE 10 G PO PACK
10.0000 g | PACK | Freq: Once | ORAL | Status: AC
  Administered 2020-01-26: 10 g via ORAL
  Filled 2020-01-26: qty 1

## 2020-01-26 NOTE — ED Notes (Signed)
Breakfast Ordered 

## 2020-01-26 NOTE — Progress Notes (Signed)
[facility]  PROGRESS NOTE  Whitney Steele ZOX:[id number] DOB: [birth year] DOA: 01/25/2020 PCP: Whitney Ohara, MD Admit date - 01/25/2020   Admitting Physician Whitney Braun, MD  Outpatient Primary MD for the patient is Steele, Whitney Mandes, MD  LOS - 1 Brief Narrative   Whitney Steele is a 74 y.o. year old female with medical history significant for chronic hypoxic respiratory failure on 3 L secondary to COPD/pulmonary fibrosis, RA, hypothyroidism, vascular dementia, HTN, HLD, dysphagia secondary to history of esophageal stricture who presented on 01/25/2020 with worsening shortness of breath at her facility and found to have O2 saturations in the mid 70s per EMS requiring nonrebreather to improve sats to 90%    Hospital course complicated requiring BiPAP after initial ABG revealed pH of 7.25 and PCO2 of 73 with PO2 of 84 in the ED.  Also noted to have elevated AST of 698, ALT of 289, BNP greater than 1000 with chest x-ray showing cardiomegaly emphysema and pulmonary fibrosis.  PCCM was consulted on admission.  Subjective  Today states her breathing is much better.  She is no longer on BiPAP currently, and tolerating 6 L nasal cannula.  Denies any chest pain.  Denies any cough  A & P    Acute on chronic hypoxic/hypercapnic respiratory failure presumed secondary to combined COPD/CHF exacerbation.  Baseline O2 of 3 L and currently needs 6 L to maintain normal respiratory effort.  Additionally required BiPAP intervention while in the ED for exacerbation likely being driven by chronic emphysema and pulmonary fibrosis.  Noted to have elevated BNP and cardiomegaly concerning for CHF however no peripheral edema noted on exam.  Covid test negative -Appreciate PCCM recommendations, continue IV Solu-Medrol, scheduled inhalers, duo nebs 3 times daily -TTE to assess EF function given high concern for CHF -BiPAP nightly and as needed, will monitor in progressive unit -Encourage incentive  spirometer, flutter valve -Goal SPO2 greater than 88%  Elevated BNP with cardiomegaly.  Some concern for CHF.  Has crackles on exam but also has noted chronic pulmonary fibrosis.  TTE shows EF of 45-50% (previous systolic function 55-60% on 09/2018) with global hypokinesis of the left ventricle and severely enlarged right ventricle and severely reduced systolic function RVSP of 71.2 mmHg.  Suspect being driven by chronic pulmonary hypertension -Obtain TTE -Daily as noted, strict intake/output -Hold off on IV Lasix currently given no overt peripheral edema -We will consult cardiology given significant reduction in systolic function of right ventricle, may warrant right heart cath once respiratory status improve versus more specific lung imaging  Elevated AST/ALT, improving.  On admission AST peaked of a 733 ALT of 345, currently downtrending.  Suspect related to hepatic congestion from right-sided heart failure. -We will also check hepatitis panel as well as right upper quadrant ultrasound -Appreciate GI recommendations Rheumatoid arthritis  Dysphagia.  Has history of esophageal dysmotility and esophageal ring and prior dilatation in 2018.  GI recommends barium esophagram once stabilized breathing to assess for any strictures or structural issues that could be leading to dysphagia -ST evaluation to formally assess her swallowing once respiratory stable  AKI with hyperkalemia, resolved.  Peak creatinine of 1.3 on admission potassium 6.1, improved with Lokelma and IV fluids, suspect prerenal -Monitor BMP, avoid nephrotoxins, monitor output  Unwitnessed fall from bed.  No fractures on imaging.  CK unremarkable.  Nursing facility did not report any fall.  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   Alert and oriented x4, following commands -Delirium precautions  -continue home Cymbalta, Xanax  HLD -Holding home Lipitor given elevated LFTs  Pain -Continue home Neurontin for   Seizure history -Continue home Keppra -Seizure precautions   Family Communication  : None, will update daughter  Code Status : DNR, discussed on day of admission  Disposition Plan  :  Patient is from skilled nursing facility. Anticipated d/c date: 2 to 3 days. Barriers to d/c or necessity for inpatient status:  Continued need for IV Solu-Medrol/scheduled inhalers for worsening respiratory status and still requiring 6 L, close monitoring of right-sided heart failure, close monitoring of elevated liver labs Consults  : GI, PCCM, cardiology  Procedures  : TTE, 8/18  DVT Prophylaxis  :  Lovenox   Lab Results  Component Value Date   PLT 178 01/26/2020    Diet :  Diet Order            Diet NPO time specified  Diet effective midnight           DIET DYS 3 Room service appropriate? Yes; Fluid consistency: Thin  Diet effective now                  Inpatient Medications Scheduled Meds: . ALPRAZolam  0.25 mg Oral BID  . aspirin EC  81 mg Oral Daily  . diltiazem  30 mg Oral TID  . docusate sodium  100 mg Oral BID  . DULoxetine  60 mg Oral Daily  . enoxaparin (LOVENOX) injection  40 mg Subcutaneous Q24H  . ferrous sulfate  325 mg Oral Q breakfast  . folic acid  1 mg Oral Daily  . gabapentin  100 mg Oral BID  . ipratropium-albuterol  3 mL Nebulization TID  . levETIRAcetam  500 mg Oral BID  . methylPREDNISolone (SOLU-MEDROL) injection  60 mg Intravenous BID  . pantoprazole  40 mg Oral BID  . polyethylene glycol  17 g Oral Daily  . sodium chloride flush  3 mL Intravenous Q12H  . thiamine  100 mg Oral Daily   Continuous Infusions: PRN Meds:.albuterol, LORazepam, morphine injection, ondansetron **OR** ondansetron (ZOFRAN) IV, oxyCODONE  Antibiotics  :   Anti-infectives (From admission, onward)    None       Objective   Vitals:   01/26/20 0645 01/26/20 0700 01/26/20 0845 01/26/20 1030  BP: (!) 154/99 (!) 158/98 122/69 (!) 151/77  Pulse: (!) 105 (!) 101 (!) 106 99  Resp: (!) 21 (!) 21 15 (!) 25  Temp:      TempSrc:      SpO2: 97% 97% 91% 93%  Weight:      Height:        SpO2: 93 % O2 Flow Rate (L/min): 15 L/min FiO2 (%): 35 %  Wt Readings from Last 3 Encounters:  01/25/20 50.8 kg  01/13/20 50.8 kg  11/02/19 54.4 kg     Intake/Output Summary (Last 24 hours) at 01/26/2020 1308 Last data filed at 01/25/2020 2148 Gross per 24 hour  Intake 513.74 ml  Output --  Net 513.74 ml    Physical Exam:     Awake Alert, oriented to self, place, time, normal affect Right hand grip decreased strength (chronic with RN) Diffuse crackles in all lung fields, normal respiratory effort on 6 L, no wheezing Left AKA Bruising around mouth, dry oral mucosa Tachycardic, SEM appreciated, no edema Abdomen soft,  nondistended, nontender     I have personally reviewed the following:   Data Reviewed:  CBC Recent Labs  Lab 01/25/20 0311 01/25/20 0342 01/25/20 0624 01/26/20 0427  WBC 5.6  --   --  8.5  HGB 12.5 13.3 12.9 12.8  HCT 41.9 39.0 38.0 42.4  PLT 177  --   --  178  MCV 97.7  --   --  94.9  MCH 29.1  --   --  28.6  MCHC 29.8*  --   --  30.2  RDW 15.3  --   --  15.5  LYMPHSABS 0.3*  --   --   --   MONOABS 0.1  --   --   --   EOSABS 0.0  --   --   --   BASOSABS 0.0  --   --   --     Chemistries  Recent Labs  Lab 01/25/20 0311 01/25/20 0342 01/25/20 0559 01/25/20 0624 01/25/20 1144 01/25/20 1522 01/26/20 0427  NA 136 137  --  136 137  --  136  K 6.7* 6.4*  --  6.1* 4.7  --  5.6*  CL 98  --   --   --  97*  --  97*  CO2 28  --   --   --  29  --  29  GLUCOSE 120*  --  120*  --  202*  --  129*  BUN 42*  --   --   --  40*  --  32*  CREATININE 1.35*  --   --   --  1.28*  --  0.86  CALCIUM 9.0  --   --   --  8.9  --  8.8*  MG  --   --   --   --   --    --  1.7  AST 698*  --   --   --   --  733* 411*  ALT 289*  --   --   --   --  345* 311*  ALKPHOS 150*  --   --   --   --  132* 122  BILITOT 1.2  --   --   --   --  0.9 1.0   ------------------------------------------------------------------------------------------------------------------ No results for input(s): CHOL, HDL, LDLCALC, TRIG, CHOLHDL, LDLDIRECT in the last 72 hours.  No results found for: HGBA1C ------------------------------------------------------------------------------------------------------------------ Recent Labs    01/26/20 0929  TSH 0.575   ------------------------------------------------------------------------------------------------------------------ No results for input(s): VITAMINB12, FOLATE, FERRITIN, TIBC, IRON, RETICCTPCT in the last 72 hours.  Coagulation profile Recent Labs  Lab 01/25/20 0311  INR 1.5*    No results for input(s): DDIMER in the last 72 hours.  Cardiac Enzymes No results for input(s): CKMB, TROPONINI, MYOGLOBIN in the last 168 hours.  Invalid input(s): CK ------------------------------------------------------------------------------------------------------------------    Component Value Date/Time   BNP 1,062.7 (H) 01/25/2020 1610    Micro Results Recent Results (from the past 240 hour(s))  Culture, blood (Routine x 2)     Status: None (Preliminary result)   Collection Time: 01/25/20  3:00 AM   Specimen: BLOOD  Result Value Ref Range Status   Specimen Description BLOOD RIGHT ARM  Final   Special Requests   Final    BOTTLES DRAWN AEROBIC AND ANAEROBIC Blood Culture results may not be optimal due to an excessive volume of blood received in culture bottles   Culture   Final    NO GROWTH 1 DAY Performed  at [facility], [address], [place], [place] [address]    Report Status PENDING  Incomplete  Culture, blood (Routine x 2)     Status: None (Preliminary result)   Collection Time: 01/25/20  3:10 AM   Specimen:  BLOOD  Result Value Ref Range Status   Specimen Description BLOOD RIGHT HAND  Final   Special Requests   Final    BOTTLES DRAWN AEROBIC ONLY Blood Culture adequate volume   Culture   Final    NO GROWTH 1 DAY Performed at [facility], [address], [place], [place] [address]    Report Status PENDING  Incomplete  SARS Coronavirus 2 by RT PCR (hospital order, performed in [facility] hospital lab) Nasopharyngeal Nasopharyngeal Swab     Status: None   Collection Time: 01/25/20  3:26 AM   Specimen: Nasopharyngeal Swab  Result Value Ref Range Status   SARS Coronavirus 2 NEGATIVE NEGATIVE Final    Comment: (NOTE) SARS-CoV-2 target nucleic acids are NOT DETECTED.  The SARS-CoV-2 RNA is generally detectable in upper and lower respiratory specimens during the acute phase of infection. The lowest concentration of SARS-CoV-2 viral copies this assay can detect is 250 copies / mL. A negative result does not preclude SARS-CoV-2 infection and should not be used as the sole basis for treatment or other patient management decisions.  A negative result may occur with improper specimen collection / handling, submission of specimen other than nasopharyngeal swab, presence of viral mutation(s) within the areas targeted by this assay, and inadequate number of viral copies (<250 copies / mL). A negative result must be combined with clinical observations, patient history, and epidemiological information.  Fact Sheet for Patients:   BoilerBrush.com.cy  Fact Sheet for Healthcare Providers: https://pope.com/  This test is not yet approved or  cleared by the [place] FDA and has been authorized for detection and/or diagnosis of SARS-CoV-2 by FDA under an Emergency Use Authorization (EUA).  This EUA will remain in effect (meaning this test can be used) for the duration of the COVID-19 declaration under Section 564(b)(1) of the Act, 21  U.S.C. section 360bbb-3(b)(1), unless the authorization is terminated or revoked sooner.  Performed at [facility], [address] [place], [place] [address]     Radiology Reports US Abdomen Limited  Result Date: 01/25/2020 CLINICAL DATA:  74 year old female with elevated LFTs. EXAM: ULTRASOUND ABDOMEN LIMITED RIGHT UPPER QUADRANT COMPARISON:  Right upper quadrant ultrasound dated 02/14/2006. FINDINGS: Gallbladder: Cholecystectomy. Common bile duct: Diameter: 4 mm Liver: There is diffuse increased liver echogenicity most commonly seen in the setting of fatty infiltration. Superimposed inflammation or fibrosis is not excluded. Clinical correlation is recommended. Portal vein is patent on color Doppler imaging with normal direction of blood flow towards the liver. Other: There is a partially visualized and incompletely evaluated 2 cm right renal upper pole cyst. IMPRESSION: 1. Cholecystectomy. 2. Fatty liver. 3. Patent main portal vein with hepatopetal flow. Electronically Signed   By: Elgie Collard M.D.   On: 01/25/2020 18:14   DG Chest Portable 1 View  Result Date: 01/25/2020 CLINICAL DATA:  Shortness of breath. EXAM: PORTABLE CHEST 1 VIEW COMPARISON:  10/13/2019, CT 08/07/2018 FINDINGS: Mild cardiomegaly. Unchanged mediastinal contours. Emphysema with interstitial coarsening. Bibasilar reticulation consistent with honeycombing. No acute or confluent airspace disease. No pleural effusion or pneumothorax. Bones are under mineralized. No acute osseous abnormalities are seen. IMPRESSION: 1. Cardiomegaly. 2. Emphysema.  Basilar honeycombing/fibrosis. Electronically Signed   By:  Narda Rutherford M.D.   On: 01/25/2020 03:10   ECHOCARDIOGRAM COMPLETE  Result Date: 01/26/2020    ECHOCARDIOGRAM REPORT   Patient Name:   Whitney Steele [place] Date of Exam: 01/26/2020 Medical Rec #:  [id number]          Height:       66.0 in Accession #:    [id number]         Weight:       112.0 lb Date of Birth:   Apr 06, 1946          BSA:          1.563 m Patient Age:    73 years           BP:           122/69 mmHg Patient Gender: F                  HR:           105 bpm. Exam Location:  Inpatient Procedure: 2D Echo Indications:    cardiomyopathy  History:        Patient has prior history of Echocardiogram examinations, most                 recent 10/07/2018. COPD; Risk Factors:Hypertension and                 Dyslipidemia.  Sonographer:    Celene Skeen RDCS (AE) Referring Phys: [phone] [facility]  Sonographer Comments: restricted mobility - patient unable to turn on left side. additionally, head of the patient unable to lower significantly, which made imaging challenging IMPRESSIONS  1. Marded right sided heart failure.  2. Left ventricular ejection fraction, by estimation, is 45 to 50%. The left ventricle has mildly decreased function. The left ventricle demonstrates global hypokinesis. Left ventricular diastolic parameters were normal.  3. Right ventricular systolic function is severely reduced. The right ventricular size is severely enlarged. There is severely elevated pulmonary artery systolic pressure.  4. Left atrial size was mildly dilated.  5. Right atrial size was severely dilated.  6. The mitral valve is abnormal. Mild mitral valve regurgitation. No evidence of mitral stenosis.  7. Tricuspid valve regurgitation is severe.  8. The aortic valve is tricuspid. Aortic valve regurgitation is mild. Mild to moderate aortic valve sclerosis/calcification is present, without any evidence of aortic stenosis.  9. The inferior vena cava is normal in size with greater than 50% respiratory variability, suggesting right atrial pressure of 3 mmHg. FINDINGS  Left Ventricle: Left ventricular ejection fraction, by estimation, is 45 to 50%. The left ventricle has mildly decreased function. The left ventricle demonstrates global hypokinesis. The left ventricular internal cavity size was normal in size. There is  no left ventricular  hypertrophy. Left ventricular diastolic parameters were normal. Right Ventricle: The right ventricular size is severely enlarged. Right vetricular wall thickness was not assessed. Right ventricular systolic function is severely reduced. There is severely elevated pulmonary artery systolic pressure. The tricuspid regurgitant velocity is 3.91 m/s, and with an assumed right atrial pressure of 10 mmHg, the estimated right ventricular systolic pressure is 71.2 mmHg. Left Atrium: Left atrial size was mildly dilated. Right Atrium: Right atrial size was severely dilated. Pericardium: There is no evidence of pericardial effusion. Mitral Valve: The mitral valve is abnormal. There is moderate thickening of the mitral valve leaflet(s). There is moderate calcification of the mitral valve leaflet(s). Normal mobility of the mitral valve leaflets. Moderate mitral annular calcification. Mild  mitral valve regurgitation. No evidence of mitral valve stenosis. Tricuspid Valve: The tricuspid valve is normal in structure. Tricuspid valve regurgitation is severe. No evidence of tricuspid stenosis. Aortic Valve: The aortic valve is tricuspid. Aortic valve regurgitation is mild. Mild to moderate aortic valve sclerosis/calcification is present, without any evidence of aortic stenosis. Pulmonic Valve: The pulmonic valve was normal in structure. Pulmonic valve regurgitation is not visualized. No evidence of pulmonic stenosis. Aorta: The aortic root is normal in size and structure. Venous: The inferior vena cava is normal in size with greater than 50% respiratory variability, suggesting right atrial pressure of 3 mmHg. IAS/Shunts: No atrial level shunt detected by color flow Doppler. Additional Comments: Marded right sided heart failure.  LEFT VENTRICLE PLAX 2D LVIDd:         4.20 cm LVIDs:         2.80 cm LV PW:         1.40 cm LV IVS:        1.00 cm LVOT diam:     2.00 cm LV SV:         35 LV SV Index:   23 LVOT Area:     3.14 cm  LEFT ATRIUM          Index      RIGHT ATRIUM           Index LA diam:    4.00 cm 2.56 cm/m RA Area:     25.90 cm                                RA Volume:   96.80 ml  61.93 ml/m  AORTIC VALVE LVOT Vmax:   73.10 cm/s LVOT Vmean:  43.500 cm/s LVOT VTI:    0.113 m TRICUSPID VALVE TR Peak grad:   61.2 mmHg TR Vmax:        391.00 cm/s  SHUNTS Systemic VTI:  0.11 m Systemic Diam: 2.00 cm Charlton Haws MD Electronically signed by Charlton Haws MD Signature Date/Time: 01/26/2020/10:51:37 AM    Final      Time Spent in minutes  30     Laverna Peace M.D on 01/26/2020 at 1:08 PM  To page go to [email or web address] - password [facility]

## 2020-01-26 NOTE — ED Notes (Signed)
Restraints removed to eat. Pt compliant. Pt was unable to grasp spoon. Pt was fed all of her jello and some broth. After completing jello the Pt did not want anymore broth. Pt followed instructions and communicated well.

## 2020-01-26 NOTE — Consult Note (Addendum)
Referring Physician:  [name] is an 74 y.o. female.                       Chief Complaint: Shortness of breath  HPI: 74 years old white female with PMH of CAD, COPD, Hypertension, hypothyroidism, Pulmonary with chronic oxygen treatment, Rheumatoid arthritis, PVD, s/p left AKA and moderate protein calorie malnutrition has worsening shortness of breath with O2 saturations in mid 70's on room air and in 90's with supplemental oxygen. Patient denies chest pain. Her chest x-ray is negative for pulmonary edema but BNP is elevated at 1062.7 pg. and Troponin I is minimally elevated. Echocardiogram shows mild LV and severe RV systolic dysfunction. Patient denies chest pain.  Past Medical History:  Diagnosis Date  . Abnormal AST and ALT   . Allergic rhinitis   . Anemia   . Anxiety   . Atherosclerotic heart disease of native coronary artery with unspecified angina pectoris (HCC)   . Benign essential hypertension   . 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 COPD (chronic obstructive pulmonary disease) (HCC)   . Depression with anxiety   . Dysphagia   . Foley catheter in place   . GERD (gastroesophageal reflux disease)   . Hypercholesterolemia   . Hypertension   . Hypothyroidism   . Insomnia   . Neuromuscular dysfunction of bladder, unspecified   . Osteoporosis   . Other osteoporosis without current pathological fracture   . Other seizures (HCC)   . Pulmonary fibrosis (HCC)   . Rheumatoid arthritis (HCC)   . Unspecified protein-calorie malnutrition (HCC)   . Unstageable pressure ulcer of sacral region (HCC)   . Vascular dementia without behavioral disturbance [facility])       Past Surgical History:  Procedure Laterality Date  . ABDOMINAL HYSTERECTOMY    . AMPUTATION Left 10/09/2018   Procedure: LEFT ABOVE KNEE AMPUTATION;  Surgeon: Nadara Mustard, MD;  Location: [facility] OR;  Service: Orthopedics;  Laterality: Left;  . BELOW KNEE LEG AMPUTATION Left    due to  gangrene  . CARPAL TUNNEL RELEASE    . ESOPHAGOGASTRODUODENOSCOPY  09/12/2016   Distal esophageal Schatzkis ring. Moderate hh. Highly tourturous esophagus. Mild esophageal diverticulum. Superficial gastric ulcer. Esophagus Bx: benign squamoglandular mucosa with chronic inflammation and squamous hyperplasia suggestive of gastroesophageal reflux disease. Stomach Bx: Ulceration and reactive gastropthy.  . ESOPHAGOGASTRODUODENOSCOPY  09/12/2016   Distal esophageal Schatzkis ring status post esophageal dilitation. Moderate hiatal hernia. Highly torturous esophagus suggestive of esophageal dysmotility/presbyesophagus. Mid esophageal diverticulum. Superficial gastric ulcer.   . INCISIONAL HERNIA REPAIR    . LAPAROSCOPIC CHOLECYSTECTOMY    . OVARY SURGERY     removal of ovarian masses    Family History  Family history unknown: Yes   Social History:  reports that she has never smoked. She has never used smokeless tobacco. She reports previous alcohol use. She reports that she does not use drugs.  Allergies:  Allergies  Allergen Reactions  . Iodinated Diagnostic Agents Hives  . Tape Other (See Comments)    Tears skin - please use paper tape ("allergic," per paperwork)    (Not in a hospital admission)   Results for orders placed or performed during the hospital encounter of 01/25/20 (from the past 48 hour(s))  Culture, blood (Routine x 2)     Status: None (Preliminary result)   Collection Time: 01/25/20  3:00 AM   Specimen: BLOOD  Result Value Ref Range  Specimen Description BLOOD RIGHT ARM    Special Requests      BOTTLES DRAWN AEROBIC AND ANAEROBIC Blood Culture results may not be optimal due to an excessive volume of blood received in culture bottles   Culture      NO GROWTH 1 DAY Performed at [facility] Lab, [address], [place], [place] [address]    Report Status PENDING   Culture, blood (Routine x 2)     Status: None (Preliminary result)   Collection Time: 01/25/20  3:10  AM   Specimen: BLOOD  Result Value Ref Range   Specimen Description BLOOD RIGHT HAND    Special Requests      BOTTLES DRAWN AEROBIC ONLY Blood Culture adequate volume   Culture      NO GROWTH 1 DAY Performed at [facility] Lab, [address], [place], [place] [address]    Report Status PENDING   Comprehensive metabolic panel     Status: Abnormal   Collection Time: 01/25/20  3:11 AM  Result Value Ref Range   Sodium 136 135 - 145 mmol/L   Potassium 6.7 (HH) 3.5 - 5.1 mmol/L    Comment: NO VISIBLE HEMOLYSIS CRITICAL RESULT CALLED TO, READ BACK BY AND VERIFIED WITH: .DENNIS A,RN 01/25/20 0355 WAYK    Chloride 98 98 - 111 mmol/L   CO2 28 22 - 32 mmol/L   Glucose, Bld 120 (H) 70 - 99 mg/dL    Comment: Glucose reference range applies only to samples taken after fasting for at least 8 hours.   BUN 42 (H) 8 - 23 mg/dL   Creatinine, Ser 7.84 (H) 0.44 - 1.00 mg/dL   Calcium 9.0 8.9 - 69.6 mg/dL   Total Protein 7.9 6.5 - 8.1 g/dL   Albumin 2.8 (L) 3.5 - 5.0 g/dL   AST 295 (H) 15 - 41 U/L   ALT 289 (H) 0 - 44 U/L   Alkaline Phosphatase 150 (H) 38 - 126 U/L   Total Bilirubin 1.2 0.3 - 1.2 mg/dL   GFR calc non Af Amer 39 (L) >60 mL/min   GFR calc Af Amer 45 (L) >60 mL/min   Anion gap 10 5 - 15    Comment: Performed at [facility] Lab, [address] [place], [place] [address]  Lactic acid, plasma     Status: None   Collection Time: 01/25/20  3:11 AM  Result Value Ref Range   Lactic Acid, Venous 1.3 0.5 - 1.9 mmol/L    Comment: Performed at [facility], [address] [place], [place] [address]  CBC with Differential     Status: Abnormal   Collection Time: 01/25/20  3:11 AM  Result Value Ref Range   WBC 5.6 4.0 - 10.5 K/uL   RBC 4.29 3.87 - 5.11 MIL/uL   Hemoglobin 12.5 12.0 - 15.0 g/dL   HCT 02.7 36 - 46 %   MCV 97.7 80.0 - 100.0 fL   MCH 29.1 26.0 - 34.0 pg   MCHC 29.8 (L) 30.0 - 36.0 g/dL   RDW 25.3 66.4 - 40.3 %   Platelets 177 150 - 400 K/uL   nRBC 0.0 0.0 -  0.2 %   Neutrophils Relative % 92 %   Neutro Abs 5.1 1.7 - 7.7 K/uL   Lymphocytes Relative 6 %   Lymphs Abs 0.3 (L) 0.7 - 4.0 K/uL   Monocytes Relative 1 %   Monocytes Absolute 0.1 0 - 1 K/uL   Eosinophils  Relative 0 %   Eosinophils Absolute 0.0 0 - 0 K/uL   Basophils Relative 0 %   Basophils Absolute 0.0 0 - 0 K/uL   Immature Granulocytes 1 %   Abs Immature Granulocytes 0.07 0.00 - 0.07 K/uL    Comment: Performed at [facility], [address] [place], [place] [address]  Protime-INR     Status: Abnormal   Collection Time: 01/25/20  3:11 AM  Result Value Ref Range   Prothrombin Time 17.1 (H) 11.4 - 15.2 seconds   INR 1.5 (H) 0.8 - 1.2    Comment: (NOTE) INR goal varies based on device and disease states. Performed at [facility] Lab, [address] [place], [place] [address]   SARS Coronavirus 2 by RT PCR (hospital order, performed in [facility] hospital lab) Nasopharyngeal Nasopharyngeal Swab     Status: None   Collection Time: 01/25/20  3:26 AM   Specimen: Nasopharyngeal Swab  Result Value Ref Range   SARS Coronavirus 2 NEGATIVE NEGATIVE    Comment: (NOTE) SARS-CoV-2 target nucleic acids are NOT DETECTED.  The SARS-CoV-2 RNA is generally detectable in upper and lower respiratory specimens during the acute phase of infection. The lowest concentration of SARS-CoV-2 viral copies this assay can detect is 250 copies / mL. A negative result does not preclude SARS-CoV-2 infection and should not be used as the sole basis for treatment or other patient management decisions.  A negative result may occur with improper specimen collection / handling, submission of specimen other than nasopharyngeal swab, presence of viral mutation(s) within the areas targeted by this assay, and inadequate number of viral copies (<250 copies / mL). A negative result must be combined with clinical observations, patient history, and epidemiological information.  Fact Sheet for Patients:    BoilerBrush.com.cy  Fact Sheet for Healthcare Providers: https://pope.com/  This test is not yet approved or  cleared by the [place] FDA and has been authorized for detection and/or diagnosis of SARS-CoV-2 by FDA under an Emergency Use Authorization (EUA).  This EUA will remain in effect (meaning this test can be used) for the duration of the COVID-19 declaration under Section 564(b)(1) of the Act, 21 U.S.C. section 360bbb-3(b)(1), unless the authorization is terminated or revoked sooner.  Performed at [facility] Lab, [address], [place], [place] [address]   Troponin I (High Sensitivity)     Status: Abnormal   Collection Time: 01/25/20  3:28 AM  Result Value Ref Range   Troponin I (High Sensitivity) 49 (H) <18 ng/L    Comment: (NOTE) Elevated high sensitivity troponin I (hsTnI) values and significant  changes across serial measurements may suggest ACS but many other  chronic and acute conditions are known to elevate hsTnI results.  Refer to the "Links" section for chest pain algorithms and additional  guidance. Performed at [facility], [address] [place], [place] [address]   Brain natriuretic peptide     Status: Abnormal   Collection Time: 01/25/20  3:28 AM  Result Value Ref Range   B Natriuretic Peptide 1,062.7 (H) 0.0 - 100.0 pg/mL    Comment: Performed at [facility], [address] [place], [place] [address]  I-Stat arterial blood gas, ED     Status: Abnormal   Collection Time: 01/25/20  3:42 AM  Result Value Ref Range   pH, Arterial 7.258 (L) 7.35 - 7.45   pCO2 arterial 73.1 (HH) 32 - 48 mmHg   pO2, Arterial  84 83 - 108 mmHg   Bicarbonate 32.8 (H) 20.0 - 28.0 mmol/L   TCO2 35 (H) 22 - 32 mmol/L   O2 Saturation 94.0 %   Acid-Base Excess 3.0 (H) 0.0 - 2.0 mmol/L   Sodium 137 135 - 145 mmol/L   Potassium 6.4 (HH) 3.5 - 5.1 mmol/L   Calcium, Ion 1.24 1.15 - 1.40 mmol/L   HCT 39.0 36 -  46 %   Hemoglobin 13.3 12.0 - 15.0 g/dL   Patient temperature 84.6 F    Collection site Radial    Drawn by RT    Sample type ARTERIAL   Lactic acid, plasma     Status: None   Collection Time: 01/25/20  5:58 AM  Result Value Ref Range   Lactic Acid, Venous 1.2 0.5 - 1.9 mmol/L    Comment: Performed at [facility], [address] [place], [place] [address]  Acetaminophen level     Status: Abnormal   Collection Time: 01/25/20  5:58 AM  Result Value Ref Range   Acetaminophen (Tylenol), Serum <10 (L) 10 - 30 ug/mL    Comment: (NOTE) Therapeutic concentrations vary significantly. A range of 10-30 ug/mL  may be an effective concentration for many patients. However, some  are best treated at concentrations outside of this range. Acetaminophen concentrations >150 ug/mL at 4 hours after ingestion  and >50 ug/mL at 12 hours after ingestion are often associated with  toxic reactions.  Performed at [facility], [address], [place], [place] [address]   Salicylate level     Status: Abnormal   Collection Time: 01/25/20  5:58 AM  Result Value Ref Range   Salicylate Lvl <7.0 (L) 7.0 - 30.0 mg/dL    Comment: Performed at [facility], [address], [place], [place] [address]  Glucose, random     Status: Abnormal   Collection Time: 01/25/20  5:59 AM  Result Value Ref Range   Glucose, Bld 120 (H) 70 - 99 mg/dL    Comment: Glucose reference range applies only to samples taken after fasting for at least 8 hours. Performed at [facility] Lab, [address] [place], [place] [address]   Troponin I (High Sensitivity)     Status: Abnormal   Collection Time: 01/25/20  5:59 AM  Result Value Ref Range   Troponin I (High Sensitivity) 51 (H) <18 ng/L    Comment: (NOTE) Elevated high sensitivity troponin I (hsTnI) values and significant  changes across serial measurements may suggest ACS but many other  chronic and acute conditions are known to elevate hsTnI results.   Refer to the "Links" section for chest pain algorithms and additional  guidance. Performed at [facility], [address], [place], [place] [address]   I-Stat arterial blood gas, ED     Status: Abnormal   Collection Time: 01/25/20  6:24 AM  Result Value Ref Range   pH, Arterial 7.374 7.35 - 7.45   pCO2 arterial 55.2 (H) 32 - 48 mmHg   pO2, Arterial 60 (L) 83 - 108 mmHg   Bicarbonate 32.4 (H) 20.0 - 28.0 mmol/L   TCO2 34 (H) 22 - 32 mmol/L   O2 Saturation 90.0 %   Acid-Base Excess 6.0 (H) 0.0 - 2.0 mmol/L   Sodium 136 135 - 145 mmol/L   Potassium 6.1 (H) 3.5 - 5.1 mmol/L   Calcium, Ion 1.16 1.15 - 1.40 mmol/L   HCT 38.0 36 - 46 %  Hemoglobin 12.9 12.0 - 15.0 g/dL   Patient temperature 08.6 F    Collection site Radial    Drawn by RT    Sample type ARTERIAL   CBG monitoring, ED     Status: Abnormal   Collection Time: 01/25/20  9:45 AM  Result Value Ref Range   Glucose-Capillary 199 (H) 70 - 99 mg/dL    Comment: Glucose reference range applies only to samples taken after fasting for at least 8 hours.  Basic metabolic panel     Status: Abnormal   Collection Time: 01/25/20 11:44 AM  Result Value Ref Range   Sodium 137 135 - 145 mmol/L   Potassium 4.7 3.5 - 5.1 mmol/L   Chloride 97 (L) 98 - 111 mmol/L   CO2 29 22 - 32 mmol/L   Glucose, Bld 202 (H) 70 - 99 mg/dL    Comment: Glucose reference range applies only to samples taken after fasting for at least 8 hours.   BUN 40 (H) 8 - 23 mg/dL   Creatinine, Ser 7.61 (H) 0.44 - 1.00 mg/dL   Calcium 8.9 8.9 - 95.0 mg/dL   GFR calc non Af Amer 42 (L) >60 mL/min   GFR calc Af Amer 48 (L) >60 mL/min   Anion gap 11 5 - 15    Comment: Performed at [facility], [address], [place], [place] [address]  CK     Status: Abnormal   Collection Time: 01/25/20  3:22 PM  Result Value Ref Range   Total CK 27 (L) 38.0 - 234.0 U/L    Comment: Performed at [facility] Lab, [address] [place], [place] [address]  Hepatic  function panel     Status: Abnormal   Collection Time: 01/25/20  3:22 PM  Result Value Ref Range   Total Protein 7.5 6.5 - 8.1 g/dL   Albumin 2.7 (L) 3.5 - 5.0 g/dL   AST 099 (H) 15 - 41 U/L   ALT 345 (H) 0 - 44 U/L   Alkaline Phosphatase 132 (H) 38 - 126 U/L   Total Bilirubin 0.9 0.3 - 1.2 mg/dL   Bilirubin, Direct 0.2 0.0 - 0.2 mg/dL   Indirect Bilirubin 0.7 0.3 - 0.9 mg/dL    Comment: Performed at [facility], [address] [place], [place] [address]  Urinalysis, Routine w reflex microscopic     Status: Abnormal   Collection Time: 01/25/20  3:53 PM  Result Value Ref Range   Color, Urine AMBER (A) YELLOW    Comment: BIOCHEMICALS MAY BE AFFECTED BY COLOR   APPearance CLEAR CLEAR   Specific Gravity, Urine 1.017 1.005 - 1.030   pH 5.0 5.0 - 8.0   Glucose, UA NEGATIVE NEGATIVE mg/dL   Hgb urine dipstick NEGATIVE NEGATIVE   Bilirubin Urine NEGATIVE NEGATIVE   Ketones, ur NEGATIVE NEGATIVE mg/dL   Protein, ur NEGATIVE NEGATIVE mg/dL   Nitrite NEGATIVE NEGATIVE   Leukocytes,Ua NEGATIVE NEGATIVE    Comment: Performed at [facility], [address] [place], [place] [address]  Creatinine, urine, random     Status: None   Collection Time: 01/25/20  3:53 PM  Result Value Ref Range   Creatinine, Urine 86.22 mg/dL    Comment: Performed at [facility], [address], [place], [place] [address]  Sodium, urine, random     Status: None   Collection Time: 01/25/20  3:53 PM  Result Value Ref Range   Sodium, Ur 50 mmol/L  Comment: Performed at [facility], [address], [place], [place] [address]  Magnesium     Status: None   Collection Time: 01/26/20  4:27 AM  Result Value Ref Range   Magnesium 1.7 1.7 - 2.4 mg/dL    Comment: Performed at [facility], [address] [place], [place] [address]  Phosphorus     Status: None   Collection Time: 01/26/20  4:27 AM  Result Value Ref Range   Phosphorus 3.4 2.5 - 4.6 mg/dL    Comment: Performed at [facility], [address], [place], [place] [address]  CBC     Status: None   Collection Time: 01/26/20  4:27 AM  Result Value Ref Range   WBC 8.5 4.0 - 10.5 K/uL   RBC 4.47 3.87 - 5.11 MIL/uL   Hemoglobin 12.8 12.0 - 15.0 g/dL   HCT 91.4 36 - 46 %   MCV 94.9 80.0 - 100.0 fL   MCH 28.6 26.0 - 34.0 pg   MCHC 30.2 30.0 - 36.0 g/dL   RDW 78.2 95.6 - 21.3 %   Platelets 178 150 - 400 K/uL   nRBC 0.0 0.0 - 0.2 %    Comment: Performed at [facility] Lab, [address] [place], [place] [address]  Comprehensive metabolic panel     Status: Abnormal   Collection Time: 01/26/20  4:27 AM  Result Value Ref Range   Sodium 136 135 - 145 mmol/L   Potassium 5.6 (H) 3.5 - 5.1 mmol/L   Chloride 97 (L) 98 - 111 mmol/L   CO2 29 22 - 32 mmol/L   Glucose, Bld 129 (H) 70 - 99 mg/dL    Comment: Glucose reference range applies only to samples taken after fasting for at least 8 hours.   BUN 32 (H) 8 - 23 mg/dL   Creatinine, Ser 8.46 0.44 - 1.00 mg/dL   Calcium 8.8 (L) 8.9 - 10.3 mg/dL   Total Protein 7.3 6.5 - 8.1 g/dL   Albumin 2.6 (L) 3.5 - 5.0 g/dL   AST 962 (H) 15 - 41 U/L   ALT 311 (H) 0 - 44 U/L   Alkaline Phosphatase 122 38 - 126 U/L   Total Bilirubin 1.0 0.3 - 1.2 mg/dL   GFR calc non Af Amer >60 >60 mL/min   GFR calc Af Amer >60 >60 mL/min   Anion gap 10 5 - 15    Comment: Performed at [facility] Lab, [address], [place], [place] [address]  CBG monitoring, ED     Status: Abnormal   Collection Time: 01/26/20  7:44 AM  Result Value Ref Range   Glucose-Capillary 120 (H) 70 - 99 mg/dL    Comment: Glucose reference range applies only to samples taken after fasting for at least 8 hours.  TSH     Status: None   Collection Time: 01/26/20  9:29 AM  Result Value Ref Range   TSH 0.575 0.350 - 4.500 uIU/mL    Comment: Performed by a 3rd Generation assay with a functional sensitivity of <=0.01 uIU/mL. Performed at [facility] Lab, [address] [place], [place] [address]    Hepatitis panel, acute     Status: None   Collection Time: 01/26/20  9:29 AM  Result Value Ref Range   Hepatitis B Surface Ag NON REACTIVE NON REACTIVE   HCV Ab NON REACTIVE NON REACTIVE    Comment: (NOTE) Nonreactive HCV antibody screen is consistent  with no HCV infections,  unless recent infection is suspected or other evidence exists to indicate HCV infection.     Hep A IgM NON REACTIVE NON REACTIVE   Hep B C IgM NON REACTIVE NON REACTIVE    Comment: Performed at [facility] Lab, [address], [place], [place] [address]  CBG monitoring, ED     Status: Abnormal   Collection Time: 01/26/20  2:40 PM  Result Value Ref Range   Glucose-Capillary 117 (H) 70 - 99 mg/dL    Comment: Glucose reference range applies only to samples taken after fasting for at least 8 hours.   US Abdomen Limited  Result Date: 01/25/2020 CLINICAL DATA:  74 year old female with elevated LFTs. EXAM: ULTRASOUND ABDOMEN LIMITED RIGHT UPPER QUADRANT COMPARISON:  Right upper quadrant ultrasound dated 02/14/2006. FINDINGS: Gallbladder: Cholecystectomy. Common bile duct: Diameter: 4 mm Liver: There is diffuse increased liver echogenicity most commonly seen in the setting of fatty infiltration. Superimposed inflammation or fibrosis is not excluded. Clinical correlation is recommended. Portal vein is patent on color Doppler imaging with normal direction of blood flow towards the liver. Other: There is a partially visualized and incompletely evaluated 2 cm right renal upper pole cyst. IMPRESSION: 1. Cholecystectomy. 2. Fatty liver. 3. Patent main portal vein with hepatopetal flow. Electronically Signed   By: Elgie Collard M.D.   On: 01/25/2020 18:14   DG Chest Portable 1 View  Result Date: 01/25/2020 CLINICAL DATA:  Shortness of breath. EXAM: PORTABLE CHEST 1 VIEW COMPARISON:  10/13/2019, CT 08/07/2018 FINDINGS: Mild cardiomegaly. Unchanged mediastinal contours. Emphysema with interstitial coarsening. Bibasilar  reticulation consistent with honeycombing. No acute or confluent airspace disease. No pleural effusion or pneumothorax. Bones are under mineralized. No acute osseous abnormalities are seen. IMPRESSION: 1. Cardiomegaly. 2. Emphysema.  Basilar honeycombing/fibrosis. Electronically Signed   By: Narda Rutherford M.D.   On: 01/25/2020 03:10   ECHOCARDIOGRAM COMPLETE  Result Date: 01/26/2020    ECHOCARDIOGRAM REPORT   Patient Name:   Whitney Steele Date of Exam: 01/26/2020 Medical Rec #:  [id number]          Height:       66.0 in Accession #:    [id number]         Weight:       112.0 lb Date of Birth:  November 12, 1945          BSA:          1.563 m Patient Age:    73 years           BP:           122/69 mmHg Patient Gender: F                  HR:           105 bpm. Exam Location:  Inpatient Procedure: 2D Echo Indications:    cardiomyopathy  History:        Patient has prior history of Echocardiogram examinations, most                 recent 10/07/2018. COPD; Risk Factors:Hypertension and                 Dyslipidemia.  Sonographer:    Celene Skeen RDCS (AE) Referring Phys: [phone] [facility]  Sonographer Comments: restricted mobility - patient unable to turn on left side. additionally, head of the patient unable to lower significantly, which made imaging challenging IMPRESSIONS  1. Marded right sided heart failure.  2. Left ventricular ejection fraction, by estimation, is 45 to 50%. The left ventricle has mildly decreased function. The left ventricle demonstrates global hypokinesis. Left ventricular diastolic parameters were normal.  3. Right ventricular systolic function is severely reduced. The right ventricular size is severely enlarged. There is severely elevated pulmonary artery systolic pressure.  4. Left atrial size was mildly dilated.  5. Right atrial size was severely dilated.  6. The mitral valve is abnormal. Mild mitral valve regurgitation. No evidence of mitral stenosis.  7. Tricuspid valve regurgitation  is severe.  8. The aortic valve is tricuspid. Aortic valve regurgitation is mild. Mild to moderate aortic valve sclerosis/calcification is present, without any evidence of aortic stenosis.  9. The inferior vena cava is normal in size with greater than 50% respiratory variability, suggesting right atrial pressure of 3 mmHg. FINDINGS  Left Ventricle: Left ventricular ejection fraction, by estimation, is 45 to 50%. The left ventricle has mildly decreased function. The left ventricle demonstrates global hypokinesis. The left ventricular internal cavity size was normal in size. There is  no left ventricular hypertrophy. Left ventricular diastolic parameters were normal. Right Ventricle: The right ventricular size is severely enlarged. Right vetricular wall thickness was not assessed. Right ventricular systolic function is severely reduced. There is severely elevated pulmonary artery systolic pressure. The tricuspid regurgitant velocity is 3.91 m/s, and with an assumed right atrial pressure of 10 mmHg, the estimated right ventricular systolic pressure is 71.2 mmHg. Left Atrium: Left atrial size was mildly dilated. Right Atrium: Right atrial size was severely dilated. Pericardium: There is no evidence of pericardial effusion. Mitral Valve: The mitral valve is abnormal. There is moderate thickening of the mitral valve leaflet(s). There is moderate calcification of the mitral valve leaflet(s). Normal mobility of the mitral valve leaflets. Moderate mitral annular calcification. Mild mitral valve regurgitation. No evidence of mitral valve stenosis. Tricuspid Valve: The tricuspid valve is normal in structure. Tricuspid valve regurgitation is severe. No evidence of tricuspid stenosis. Aortic Valve: The aortic valve is tricuspid. Aortic valve regurgitation is mild. Mild to moderate aortic valve sclerosis/calcification is present, without any evidence of aortic stenosis. Pulmonic Valve: The pulmonic valve was normal in structure.  Pulmonic valve regurgitation is not visualized. No evidence of pulmonic stenosis. Aorta: The aortic root is normal in size and structure. Venous: The inferior vena cava is normal in size with greater than 50% respiratory variability, suggesting right atrial pressure of 3 mmHg. IAS/Shunts: No atrial level shunt detected by color flow Doppler. Additional Comments: Marded right sided heart failure.  LEFT VENTRICLE PLAX 2D LVIDd:         4.20 cm LVIDs:         2.80 cm LV PW:         1.40 cm LV IVS:        1.00 cm LVOT diam:     2.00 cm LV SV:         35 LV SV Index:   23 LVOT Area:     3.14 cm  LEFT ATRIUM         Index      RIGHT ATRIUM           Index LA diam:    4.00 cm 2.56 cm/m RA Area:     25.90 cm                                RA Volume:  96.80 ml  61.93 ml/m  AORTIC VALVE LVOT Vmax:   73.10 cm/s LVOT Vmean:  43.500 cm/s LVOT VTI:    0.113 m TRICUSPID VALVE TR Peak grad:   61.2 mmHg TR Vmax:        391.00 cm/s  SHUNTS Systemic VTI:  0.11 m Systemic Diam: 2.00 cm Charlton Haws MD Electronically signed by Charlton Haws MD Signature Date/Time: 01/26/2020/10:51:37 AM    Final     Review Of Systems Constitutional: No fever, chills, weight loss or gain. Eyes: No vision change, wears glasses. No discharge or pain. Ears: No hearing loss, No tinnitus.  Respiratory: No asthma, Positive COPD and pulmonary fibrosis, pneumonias. No shortness of breath. No hemoptysis. Cardiovascular: No chest pain, palpitation, leg edema.  Gastrointestinal: No nausea, vomiting, diarrhea, constipation. No GI bleed. No hepatitis. Genitourinary: No dysuria, hematuria, kidney stone. No incontinance. Neurological: No headache, stroke, seizures.  Psychiatry: No psych facility admission for anxiety, depression, suicide. No detox. Skin: No rash. Musculoskeletal: Positive joint pain, fibromyalgia, neck pain, back pain. Left AKA. Lymphadenopathy: No lymphadenopathy. Hematology: No anemia or easy bruising.   Blood pressure (!)  142/92, pulse 95, temperature 98 F (36.7 C), temperature source Oral, resp. rate 17, height 5\' 6"  (1.676 m), weight 50.8 kg, SpO2 92 %. Body mass index is 18.08 kg/m. General appearance: alert, cooperative, appears stated age and no distress Head: Normocephalic, atraumatic. Eyes: Blue eyes, pink conjunctiva, corneas clear. PERRL, EOM's intact. Neck: No adenopathy, no carotid bruit, no JVD, supple, symmetrical, trachea midline and thyroid not enlarged. Resp: Crackles to auscultation bilaterally. Cardio: Regular rate and rhythm, S1, S2 normal, II/VI systolic murmur, no click, rub or gallop GI: Soft, non-tender; bowel sounds normal; no organomegaly. Extremities: No edema, cyanosis or clubbing. Left AKA. Skin: Warm and dry.  Neurologic: Alert and oriented X 3, Wheelchair bound.  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 Will try small diuresis if tolerated. Continue small dose diltiazem. Add small dose lanoxin for LV and RV failure. Continue oxygen supplement. Agree with holding atorvastatin.  Time spent: Review of old records, Lab, x-rays, EKG, other cardiac tests, examination, discussion with patient and referring doctor over 70 minutes.  Ricki Rodriguez, MD  01/26/2020, 2:47 PM

## 2020-01-26 NOTE — Progress Notes (Signed)
°  Echocardiogram 2D Echocardiogram has been performed.  Whitney Steele 01/26/2020, 10:31 AM

## 2020-01-26 NOTE — Progress Notes (Addendum)
Daily Rounding Note  01/26/2020, 8:42 AM  LOS: 1 day   SUBJECTIVE:   Chief complaint:     Pt awakened enough to be fed clear liquids this AM but unable to grasp spoon.   Much more alert this AM.  Denies abdominal pain, nausea, anorexia, black or bloody stools.  Says she moves her bowels about every other day.  Dysphagia has not gotten any worse.  OBJECTIVE:         Vital signs in last 24 hours:    Pulse Rate:  [93-118] 101 (08/18 0700) Resp:  [16-22] 21 (08/18 0700) BP: (113-158)/(76-123) 158/98 (08/18 0700) SpO2:  [91 %-100 %] 97 % (08/18 0700) FiO2 (%):  [35 %-45 %] 35 % (08/17 1630)   Filed Weights   01/25/20 0253  Weight: 50.8 kg   General: Alert, conversant .  Bruising around [place]  Heart: RRR. Chest: Some crackles throughout Abdomen: Soft, nontender, nondistended.  Active bowel sounds. Extremities: Nonpitting, slight, lower extremity swelling.  Left AKA Neuro/Psych: Alert.  Oriented x3.  Appropriate.  Speech limited but mostly fluid.  Engaged.  Intake/Output from previous day: 08/17 0701 - 08/18 0700 In: 513.7 [I.V.:413.7; IV Piggyback:100] Out: -   Intake/Output this shift: No intake/output data recorded.  Lab Results: Recent Labs    01/25/20 0311 01/25/20 0311 01/25/20 0342 01/25/20 0624 01/26/20 0427  WBC 5.6  --   --   --  8.5  HGB 12.5   < > 13.3 12.9 12.8  HCT 41.9   < > 39.0 38.0 42.4  PLT 177  --   --   --  178   < > = values in this interval not displayed.   BMET Recent Labs    01/25/20 0311 01/25/20 0342 01/25/20 0559 01/25/20 0624 01/25/20 1144 01/26/20 0427  NA 136   < >  --  136 137 136  K 6.7*   < >  --  6.1* 4.7 5.6*  CL 98  --   --   --  97* 97*  CO2 28  --   --   --  29 29  GLUCOSE 120*   < > 120*  --  202* 129*  BUN 42*  --   --   --  40* 32*  CREATININE 1.35*  --   --   --  1.28* 0.86  CALCIUM 9.0  --   --   --  8.9 8.8*   < > = values in this interval not  displayed.   LFT Recent Labs    01/25/20 0311 01/25/20 1522 01/26/20 0427  PROT 7.9 7.5 7.3  ALBUMIN 2.8* 2.7* 2.6*  AST 698* 733* 411*  ALT 289* 345* 311*  ALKPHOS 150* 132* 122  BILITOT 1.2 0.9 1.0  BILIDIR  --  0.2  --   IBILI  --  0.7  --    PT/INR Recent Labs    01/25/20 0311  LABPROT 17.1*  INR 1.5*   Hepatitis Panel No results for input(s): HEPBSAG, HCVAB, HEPAIGM, HEPBIGM in the last 72 hours.  Studies/Results: US Abdomen Limited  Result Date: 01/25/2020 CLINICAL DATA:  74 year old female with elevated LFTs. EXAM: ULTRASOUND ABDOMEN LIMITED RIGHT UPPER QUADRANT COMPARISON:  Right upper quadrant ultrasound dated 02/14/2006. FINDINGS: Gallbladder: Cholecystectomy. Common bile duct: Diameter: 4 mm Liver: There is diffuse increased liver echogenicity most commonly seen in the setting of fatty infiltration. Superimposed inflammation or fibrosis is not excluded. Clinical correlation is  recommended. Portal vein is patent on color Doppler imaging with normal direction of blood flow towards the liver. Other: There is a partially visualized and incompletely evaluated 2 cm right renal upper pole cyst. IMPRESSION: 1. Cholecystectomy. 2. Fatty liver. 3. Patent main portal vein with hepatopetal flow. Electronically Signed   By: Elgie Collard M.D.   On: 01/25/2020 18:14   DG Chest Portable 1 View  Result Date: 01/25/2020 CLINICAL DATA:  Shortness of breath. EXAM: PORTABLE CHEST 1 VIEW COMPARISON:  10/13/2019, CT 08/07/2018 FINDINGS: Mild cardiomegaly. Unchanged mediastinal contours. Emphysema with interstitial coarsening. Bibasilar reticulation consistent with honeycombing. No acute or confluent airspace disease. No pleural effusion or pneumothorax. Bones are under mineralized. No acute osseous abnormalities are seen. IMPRESSION: 1. Cardiomegaly. 2. Emphysema.  Basilar honeycombing/fibrosis. Electronically Signed   By: Narda Rutherford M.D.   On: 01/25/2020 03:10    ASSESMENT:   *    Elevated LFTs.  Improving. Likely shock liver.  Ultrasound pending, not yet completed.  Acute hepatitis serologies not yet collected.      *   Long standing solid food dysphagia  *    Acute on chronic hypoxic/hypercarbic resp failure.  On O2, nebs, steroids.    *   AMS, delerium  *   Hyperkalemia, improved but persists at 5.6.    *   AKI.  Improved.    *   ? Rhabdo, CK not elevated.  *   Elevated BNP.    *   Coagulopathy.  INR 1.5.     PLAN   *  Advance to chopped diet. Obtain barium esophagram tomorrow Could ask SLP to perform MBS test to assess for aspiration risk, will defer decision to attending.    *   Await abd ultrasound and acute Hep serologies.       Whitney Steele  01/26/2020, 8:42 AM Phone [phone]

## 2020-01-26 NOTE — Progress Notes (Addendum)
NAME:  Whitney Steele, MRN:  [id number], DOB:  [birth year], LOS: 1 ADMISSION DATE:  01/25/2020, CONSULTATION DATE: 01/25/2020 REFERRING MD: Emergency department physician, CHIEF COMPLAINT: Shortness of breath hyperkalemia  Brief History   74 year old female nursing home resident dementia with normally O2 dependent secondary to COPD pulmonary fibrosis now with increasing shortness of breath and hyperkalemia.  History of present illness   74 year old female who resides in a skilled nursing facility with extensive past medical history consisting of dementia, pulmonary fibrosis current usually on 2 to 3 L nasal cannula.  She is reported to be short of breath EMS evaluated her and found her sats to be 70% and felt she was cyanotic.  She is transferred to [facility] emergency department with a potassium 6.4 and creatinine 1.35.  BNP was noted to be greater than thousand.  She was placed on BiPAP and deescalated to 50% facemask.  Pulmonary critical care called to evaluate.  Past Medical History   Past Medical History:  Diagnosis Date  . Abnormal AST and ALT   . Allergic rhinitis   . Anemia   . Anxiety   . Atherosclerotic heart disease of native coronary artery with unspecified angina pectoris (HCC)   . Benign essential hypertension   . 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 COPD (chronic obstructive pulmonary disease) (HCC)   . Depression with anxiety   . Dysphagia   . Foley catheter in place   . GERD (gastroesophageal reflux disease)   . Hypercholesterolemia   . Hypertension   . Hypothyroidism   . Insomnia   . Neuromuscular dysfunction of bladder, unspecified   . Osteoporosis   . Other osteoporosis without current pathological fracture   . Other seizures (HCC)   . Pulmonary fibrosis (HCC)   . Rheumatoid arthritis (HCC)   . Unspecified protein-calorie malnutrition (HCC)   . Unstageable pressure ulcer of sacral region (HCC)   . Vascular dementia without  behavioral disturbance (HCC)      Significant Hospital Events     Consults:  01/25/2020 pulmonary critical care  Procedures:    Significant Diagnostic Tests:    Micro Data:    Antimicrobials:    Interim history/subjective:  No events. Feels better from a breathing standpoint. Wore BIPAP intermittently overnight. Denies pain. Has dry cough.  Objective   Blood pressure (!) 158/98, pulse (!) 101, temperature 97.9 F (36.6 C), temperature source Oral, resp. rate (!) 21, height 5\' 6"  (1.676 m), weight 50.8 kg, SpO2 97 %.    Vent Mode: PCV FiO2 (%):  [35 %-50 %] 35 % Set Rate:  [12 bmp] 12 bmp PEEP:  [5 cmH20] 5 cmH20   Intake/Output Summary (Last 24 hours) at 01/26/2020 01/28/2020 Last data filed at 01/25/2020 2148 Gross per 24 hour  Intake 513.74 ml  Output --  Net 513.74 ml   Filed Weights   01/25/20 0253  Weight: 50.8 kg    Examination: GEN: frail elderly woman lying in bed HEENT: peri-oral bruising, MM dry CV: RRR, +SEM, occasional PVCs, ext warm PULM: Basilar crackles, no wheezing, no accessory muscle use GI: Soft, +BS EXT: muscle wasting without edema, L AKA NEURO: moves all 4 ext to command, profoundly weak PSYCH: answering questions appropriately SKIN: multiple bruises  K 5.6 (improved) Shock liver improving CBC benign CK normal CXR stable  Resolved Hospital Problem list     Assessment & Plan:  Acute on chronic hypoxemic and hypercapneic respiratory failure- due to deconditioning, advanced emphysema  likely in flare, always question of intermittent aspiration given her GI issues. - BIPAP at night and PRN during day - Nebs and steroids as ordered for today, will outline taper tomorrow - IS/flutter - Wean O2 as tolerated to get SpO2 > 88%  Dementia, frail elderly wheelchair bound SNF resident - DNR - Palliative care consult, would be good hospice candidate from my standpoint  Will follow with you  Myrla Halsted MD PCCM

## 2020-01-27 ENCOUNTER — Inpatient Hospital Stay (HOSPITAL_COMMUNITY): Payer: Medicare Other

## 2020-01-27 DIAGNOSIS — R7989 Other specified abnormal findings of blood chemistry: Secondary | ICD-10-CM

## 2020-01-27 DIAGNOSIS — R1310 Dysphagia, unspecified: Secondary | ICD-10-CM

## 2020-01-27 DIAGNOSIS — Z515 Encounter for palliative care: Secondary | ICD-10-CM

## 2020-01-27 DIAGNOSIS — Z7189 Other specified counseling: Secondary | ICD-10-CM

## 2020-01-27 DIAGNOSIS — R0902 Hypoxemia: Secondary | ICD-10-CM

## 2020-01-27 LAB — CBC
HCT: 45.7 % (ref 36.0–46.0)
Hemoglobin: 14.2 g/dL (ref 12.0–15.0)
MCH: 29 pg (ref 26.0–34.0)
MCHC: 31.1 g/dL (ref 30.0–36.0)
MCV: 93.5 fL (ref 80.0–100.0)
Platelets: 231 10*3/uL (ref 150–400)
RBC: 4.89 MIL/uL (ref 3.87–5.11)
RDW: 15.8 % — ABNORMAL HIGH (ref 11.5–15.5)
WBC: 9.3 10*3/uL (ref 4.0–10.5)
nRBC: 0 % (ref 0.0–0.2)

## 2020-01-27 LAB — COMPREHENSIVE METABOLIC PANEL
ALT: 286 U/L — ABNORMAL HIGH (ref 0–44)
AST: 271 U/L — ABNORMAL HIGH (ref 15–41)
Albumin: 2.5 g/dL — ABNORMAL LOW (ref 3.5–5.0)
Alkaline Phosphatase: 101 U/L (ref 38–126)
Anion gap: 11 (ref 5–15)
BUN: 27 mg/dL — ABNORMAL HIGH (ref 8–23)
CO2: 38 mmol/L — ABNORMAL HIGH (ref 22–32)
Calcium: 8.8 mg/dL — ABNORMAL LOW (ref 8.9–10.3)
Chloride: 90 mmol/L — ABNORMAL LOW (ref 98–111)
Creatinine, Ser: 0.83 mg/dL (ref 0.44–1.00)
GFR calc Af Amer: >60 mL/min (ref >60)
GFR calc non Af Amer: >60 mL/min (ref >60)
Glucose, Bld: 124 mg/dL — ABNORMAL HIGH (ref 70–99)
Potassium: 3.4 mmol/L — ABNORMAL LOW (ref 3.5–5.1)
Sodium: 139 mmol/L (ref 135–145)
Total Bilirubin: 0.9 mg/dL (ref 0.3–1.2)
Total Protein: 7.2 g/dL (ref 6.5–8.1)

## 2020-01-27 LAB — GLUCOSE, CAPILLARY: Glucose-Capillary: 117 mg/dL — ABNORMAL HIGH (ref 70–99)

## 2020-01-27 LAB — PHOSPHORUS: Phosphorus: 3 mg/dL (ref 2.5–4.6)

## 2020-01-27 LAB — MAGNESIUM: Magnesium: 1.7 mg/dL (ref 1.7–2.4)

## 2020-01-27 LAB — UREA NITROGEN, URINE: Urea Nitrogen, Ur: 477 mg/dL

## 2020-01-27 MED ORDER — MAGNESIUM SULFATE 2 GM/50ML IV SOLN
2.0000 g | Freq: Once | INTRAVENOUS | Status: AC
  Administered 2020-01-27: 2 g via INTRAVENOUS
  Filled 2020-01-27: qty 50

## 2020-01-27 MED ORDER — POTASSIUM CHLORIDE 20 MEQ PO PACK
40.0000 meq | PACK | Freq: Once | ORAL | Status: AC
  Administered 2020-01-27: 40 meq via ORAL
  Filled 2020-01-27: qty 2

## 2020-01-27 MED ORDER — LIP MEDEX EX OINT
TOPICAL_OINTMENT | CUTANEOUS | Status: DC | PRN
  Administered 2020-01-28: 1 via TOPICAL
  Filled 2020-01-27: qty 7

## 2020-01-27 MED ORDER — PREDNISONE 20 MG PO TABS
40.0000 mg | ORAL_TABLET | Freq: Every day | ORAL | Status: DC
  Administered 2020-01-28 – 2020-01-31 (×4): 40 mg via ORAL
  Filled 2020-01-27 (×4): qty 2

## 2020-01-27 MED ORDER — POTASSIUM CHLORIDE ER 10 MEQ PO TBCR
10.0000 meq | EXTENDED_RELEASE_TABLET | ORAL | Status: DC
  Administered 2020-01-31: 10 meq via ORAL
  Filled 2020-01-27 (×3): qty 1

## 2020-01-27 MED ORDER — FUROSEMIDE 20 MG PO TABS
20.0000 mg | ORAL_TABLET | ORAL | Status: DC
  Administered 2020-01-31: 20 mg via ORAL
  Filled 2020-01-27 (×2): qty 1

## 2020-01-27 MED ORDER — IPRATROPIUM-ALBUTEROL 0.5-2.5 (3) MG/3ML IN SOLN
3.0000 mL | Freq: Four times a day (QID) | RESPIRATORY_TRACT | Status: DC
  Administered 2020-01-27 – 2020-01-28 (×3): 3 mL via RESPIRATORY_TRACT
  Filled 2020-01-27 (×3): qty 3

## 2020-01-27 MED ORDER — SODIUM CHLORIDE 0.9 % IV SOLN
INTRAVENOUS | Status: DC | PRN
  Administered 2020-01-27: 250 mL via INTRAVENOUS

## 2020-01-27 NOTE — Progress Notes (Signed)
NAME:  Whitney Steele, MRN:  [id number], DOB:  [birth year], LOS: 2 ADMISSION DATE:  01/25/2020, CONSULTATION DATE: 01/25/2020 REFERRING MD: Emergency department physician, CHIEF COMPLAINT: Shortness of breath hyperkalemia  Brief History   74 year old female nursing home resident dementia with normally O2 dependent secondary to COPD pulmonary fibrosis now with increasing shortness of breath and hyperkalemia.  History of present illness   74 year old female who resides in a skilled nursing facility with extensive past medical history consisting of dementia, pulmonary fibrosis current usually on 2 to 3 L nasal cannula.  She is reported to be short of breath EMS evaluated her and found her sats to be 70% and felt she was cyanotic.  She is transferred to [facility] emergency department with a potassium 6.4 and creatinine 1.35.  BNP was noted to be greater than thousand.  She was placed on BiPAP and deescalated to 50% facemask.  Pulmonary critical care called to evaluate.  Past Medical History   Past Medical History:  Diagnosis Date  . Abnormal AST and ALT   . Allergic rhinitis   . Anemia   . Anxiety   . Atherosclerotic heart disease of native coronary artery with unspecified angina pectoris (HCC)   . Benign essential hypertension   . 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 COPD (chronic obstructive pulmonary disease) (HCC)   . Depression with anxiety   . Dysphagia   . Foley catheter in place   . GERD (gastroesophageal reflux disease)   . Hypercholesterolemia   . Hypertension   . Hypothyroidism   . Insomnia   . Neuromuscular dysfunction of bladder, unspecified   . Osteoporosis   . Other osteoporosis without current pathological fracture   . Other seizures (HCC)   . Pulmonary fibrosis (HCC)   . Rheumatoid arthritis (HCC)   . Unspecified protein-calorie malnutrition (HCC)   . Unstageable pressure ulcer of sacral region (HCC)   . Vascular dementia without  behavioral disturbance (HCC)      Significant Hospital Events     Consults:  01/25/2020 pulmonary critical care  Procedures:    Significant Diagnostic Tests:    Micro Data:    Antimicrobials:    Interim history/subjective:  No events. Feels better from a breathing standpoint. Wore BIPAP intermittently overnight. Denies pain. Has dry cough.  Objective   Blood pressure (!) 147/95, pulse 92, temperature 97.8 F (36.6 C), temperature source Oral, resp. rate 18, height 5\' 6"  (1.676 m), weight 54.4 kg, SpO2 100 %.        Intake/Output Summary (Last 24 hours) at 01/27/2020 0936 Last data filed at 01/27/2020 0500 Gross per 24 hour  Intake 363 ml  Output 2000 ml  Net -1637 ml   Filed Weights   01/25/20 0253 01/26/20 1511 01/27/20 0934  Weight: 50.8 kg 54.4 kg 54.4 kg    Examination: General: Frail cachectic female extremities: Scattered HEENT: Ecchymotic area left jaw from obvious fall Neuro: Appears orientated CV: Distant PULM: Scant air movement currently on 4 L nasal   GI: soft, bsx4 active  Extremities: warm/dry,  edema  Skin: no rashes or lesions  Resolved Hospital Problem list     Assessment & Plan:  Acute on chronic hypoxemic and hypercapneic respiratory failure- due to deconditioning, advanced emphysema likely in flare, always question of intermittent aspiration given her GI issues.  Continue pulmonary toilet Continue bronchodilators As needed BiPAP as needed Wean FiO2 to keep sats greater than 88% No acute issues at the time  of examination  Dementia, frail elderly wheelchair bound SNF resident DNR Consider palliative care consult    Brett Canales Corrissa Martello ACNP Acute Care Nurse Practitioner [facility] Pulmonary/Critical Care Please consult Amion 01/27/2020, 9:36 AM

## 2020-01-27 NOTE — H&P (View-Only) (Signed)
[facility] Progress Note  CC:  Dysphagia, elevated LFTs  Subjective:  No complaints this AM.  No pain.  Esophagram will be this afternoon per her nurse.  Objective:  Vital signs in last 24 hours: Temp:  [97.4 F (36.3 C)-98.5 F (36.9 C)] 97.8 F (36.6 C) (08/19 0526) Pulse Rate:  [80-102] 92 (08/19 0526) Resp:  [16-25] 18 (08/19 0526) BP: (129-160)/(58-144) 147/95 (08/19 0526) SpO2:  [91 %-100 %] 100 % (08/19 0745) Weight:  [54.4 kg] 54.4 kg (08/18 1511) Last BM Date:  (pta) General:  Alert, elderly and frail, in NAD; bruising noted around mouth Heart:  Regular rate and rhythm; no murmurs Pulm:  CTAB.  No W/R/R. Abdomen:  Soft, non-distended.  BS present.  Non-tender. Extremities:  Without edema. Neurologic:  Alert and oriented;  grossly normal neurologically.  Intake/Output from previous day: 08/18 0701 - 08/19 0700 In: 363 [P.O.:360; I.V.:3] Out: 2000 [Urine:2000]  Lab Results: Recent Labs    01/25/20 0311 01/25/20 0342 01/25/20 0624 01/26/20 0427 01/27/20 0436  WBC 5.6  --   --  8.5 9.3  HGB 12.5   < > 12.9 12.8 14.2  HCT 41.9   < > 38.0 42.4 45.7  PLT 177  --   --  178 231   < > = values in this interval not displayed.   BMET Recent Labs    01/25/20 1144 01/26/20 0427 01/27/20 0436  NA 137 136 139  K 4.7 5.6* 3.4*  CL 97* 97* 90*  CO2 29 29 38*  GLUCOSE 202* 129* 124*  BUN 40* 32* 27*  CREATININE 1.28* 0.86 0.83  CALCIUM 8.9 8.8* 8.8*   LFT Recent Labs    01/25/20 1522 01/26/20 0427 01/27/20 0436  PROT 7.5   < > 7.2  ALBUMIN 2.7*   < > 2.5*  AST 733*   < > 271*  ALT 345*   < > 286*  ALKPHOS 132*   < > 101  BILITOT 0.9   < > 0.9  BILIDIR 0.2  --   --   IBILI 0.7  --   --    < > = values in this interval not displayed.   PT/INR Recent Labs    01/25/20 0311  LABPROT 17.1*  INR 1.5*   Hepatitis Panel Recent Labs    01/26/20 0929  HEPBSAG NON REACTIVE  HCVAB NON REACTIVE  HEPAIGM NON REACTIVE  HEPBIGM NON  REACTIVE    US Abdomen Limited  Result Date: 01/25/2020 CLINICAL DATA:  74 year old female with elevated LFTs. EXAM: ULTRASOUND ABDOMEN LIMITED RIGHT UPPER QUADRANT COMPARISON:  Right upper quadrant ultrasound dated 02/14/2006. FINDINGS: Gallbladder: Cholecystectomy. Common bile duct: Diameter: 4 mm Liver: There is diffuse increased liver echogenicity most commonly seen in the setting of fatty infiltration. Superimposed inflammation or fibrosis is not excluded. Clinical correlation is recommended. Portal vein is patent on color Doppler imaging with normal direction of blood flow towards the liver. Other: There is a partially visualized and incompletely evaluated 2 cm right renal upper pole cyst. IMPRESSION: 1. Cholecystectomy. 2. Fatty liver. 3. Patent main portal vein with hepatopetal flow. Electronically Signed   By: Elgie Collard M.D.   On: 01/25/2020 18:14   DG Chest Port 1 View  Result Date: 01/26/2020 CLINICAL DATA:  Shortness of breath EXAM: PORTABLE CHEST 1 VIEW COMPARISON:  01/25/2020 FINDINGS: Bilateral chronic interstitial thickening. Bilateral emphysematous changes. No focal consolidation. No pleural effusion or pneumothorax. Heart and mediastinal contours are unremarkable. No acute  osseous abnormality. IMPRESSION: No active disease. Electronically Signed   By: Elige Ko   On: 01/26/2020 16:23   ECHOCARDIOGRAM COMPLETE  Result Date: 01/26/2020    ECHOCARDIOGRAM REPORT   Patient Name:   Whitney Steele Date of Exam: 01/26/2020 Medical Rec #:  [id number]          Height:       66.0 in Accession #:    [id number]         Weight:       112.0 lb Date of Birth:  [birth year]          BSA:          1.563 m Patient Age:    73 years           BP:           122/69 mmHg Patient Gender: F                  HR:           105 bpm. Exam Location:  Inpatient Procedure: 2D Echo Indications:    cardiomyopathy  History:        Patient has prior history of Echocardiogram examinations, most                  recent 10/07/2018. COPD; Risk Factors:Hypertension and                 Dyslipidemia.  Sonographer:    Celene Skeen RDCS (AE) Referring Phys: [phone] [facility] SM[facility]  Sonographer Comments: restricted mobility - patient unable to turn on left side. additionally, head of the patient unable to lower significantly, which made imaging challenging IMPRESSIONS  1. Marded right sided heart failure.  2. Left ventricular ejection fraction, by estimation, is 45 to 50%. The left ventricle has mildly decreased function. The left ventricle demonstrates global hypokinesis. Left ventricular diastolic parameters were normal.  3. Right ventricular systolic function is severely reduced. The right ventricular size is severely enlarged. There is severely elevated pulmonary artery systolic pressure.  4. Left atrial size was mildly dilated.  5. Right atrial size was severely dilated.  6. The mitral valve is abnormal. Mild mitral valve regurgitation. No evidence of mitral stenosis.  7. Tricuspid valve regurgitation is severe.  8. The aortic valve is tricuspid. Aortic valve regurgitation is mild. Mild to moderate aortic valve sclerosis/calcification is present, without any evidence of aortic stenosis.  9. The inferior vena cava is normal in size with greater than 50% respiratory variability, suggesting right atrial pressure of 3 mmHg. FINDINGS  Left Ventricle: Left ventricular ejection fraction, by estimation, is 45 to 50%. The left ventricle has mildly decreased function. The left ventricle demonstrates global hypokinesis. The left ventricular internal cavity size was normal in size. There is  no left ventricular hypertrophy. Left ventricular diastolic parameters were normal. Right Ventricle: The right ventricular size is severely enlarged. Right vetricular wall thickness was not assessed. Right ventricular systolic function is severely reduced. There is severely elevated pulmonary artery systolic pressure. The tricuspid regurgitant  velocity is 3.91 m/s, and with an assumed right atrial pressure of 10 mmHg, the estimated right ventricular systolic pressure is 71.2 mmHg. Left Atrium: Left atrial size was mildly dilated. Right Atrium: Right atrial size was severely dilated. Pericardium: There is no evidence of pericardial effusion. Mitral Valve: The mitral valve is abnormal. There is moderate thickening of the mitral valve leaflet(s). There is moderate calcification of the mitral valve leaflet(s). Normal mobility  of the mitral valve leaflets. Moderate mitral annular calcification. Mild mitral valve regurgitation. No evidence of mitral valve stenosis. Tricuspid Valve: The tricuspid valve is normal in structure. Tricuspid valve regurgitation is severe. No evidence of tricuspid stenosis. Aortic Valve: The aortic valve is tricuspid. Aortic valve regurgitation is mild. Mild to moderate aortic valve sclerosis/calcification is present, without any evidence of aortic stenosis. Pulmonic Valve: The pulmonic valve was normal in structure. Pulmonic valve regurgitation is not visualized. No evidence of pulmonic stenosis. Aorta: The aortic root is normal in size and structure. Venous: The inferior vena cava is normal in size with greater than 50% respiratory variability, suggesting right atrial pressure of 3 mmHg. IAS/Shunts: No atrial level shunt detected by color flow Doppler. Additional Comments: Marded right sided heart failure.  LEFT VENTRICLE PLAX 2D LVIDd:         4.20 cm LVIDs:         2.80 cm LV PW:         1.40 cm LV IVS:        1.00 cm LVOT diam:     2.00 cm LV SV:         35 LV SV Index:   23 LVOT Area:     3.14 cm  LEFT ATRIUM         Index      RIGHT ATRIUM           Index LA diam:    4.00 cm 2.56 cm/m RA Area:     25.90 cm                                RA Volume:   96.80 ml  61.93 ml/m  AORTIC VALVE LVOT Vmax:   73.10 cm/s LVOT Vmean:  43.500 cm/s LVOT VTI:    0.113 m TRICUSPID VALVE TR Peak grad:   61.2 mmHg TR Vmax:        391.00 cm/s   SHUNTS Systemic VTI:  0.11 m Systemic Diam: 2.00 cm Charlton Haws MD Electronically signed by Charlton Haws MD Signature Date/Time: 01/26/2020/10:51:37 AM    Final    Assessment / Plan: *   Elevated LFTs.  Improving. Likely shock liver.  Ultrasound showed fatty liver, patent portal vein and normal flow.  Acute viral hepatitis studies negative.  *   Long standing solid food dysphagia  *    Acute on chronic hypoxic/hypercarbic resp failure.  On O2, nebs, steroids.    *   AMS, delerium  *   Hyperkalemia, resolved.  *   AKI.  Improved.    *   ? Rhabdo, CK not elevated.  *   Elevated BNP  *   Coagulopathy.  INR 1.5.    **Waiting for barium esophagram to be performed later today. **Could ask SLP to perform MBS test to assess for aspiration risk, will defer decision to attending.  **Continue to monitor LFTs.   LOS: 2 days   Princella Pellegrini. Abhiram Criado  01/27/2020, 9:25 AM

## 2020-01-27 NOTE — Consult Note (Signed)
Ref: Blane Ohara, MD   Subjective:  Awake. Mild respiratory distress at rest. Improved oxygenation with 2L of diuresis with furosemide use.  Objective:  Vital Signs in the last 24 hours: Temp:  [97.4 F (36.3 C)-98.5 F (36.9 C)] 97.8 F (36.6 C) (08/19 0526) Pulse Rate:  [80-102] 92 (08/19 0526) Cardiac Rhythm: Normal sinus rhythm;Bundle branch block (08/19 0700) Resp:  [16-25] 18 (08/19 0526) BP: (129-160)/(58-144) 147/95 (08/19 0526) SpO2:  [91 %-100 %] 100 % (08/19 0745) Weight:  [54.4 kg] 54.4 kg (08/19 0934)  Physical Exam: BP Readings from Last 1 Encounters:  01/27/20 (!) 147/95     Wt Readings from Last 1 Encounters:  01/27/20 54.4 kg    Weight change:  Body mass index is 19.37 kg/m. HEENT: [place]/AT, Eyes-Blue, PERL, EOMI, Conjunctiva-Pink, Sclera-Non-icteric Neck: No JVD, No bruit, Trachea midline. Lungs:  Clearing, Bilateral. Fine crackles are scattered. Cardiac:  Regular rhythm, normal S1 and S2, no S3. II/VI systolic murmur. Abdomen:  Soft, non-tender. BS present. Extremities:  No edema present. No cyanosis. No clubbing.Left AKA. CNS: AxOx3, Cranial nerves grossly intact, moves all 4 extremities.  Skin: Warm and dry.   Intake/Output from previous day: 08/18 0701 - 08/19 0700 In: 363 [P.O.:360; I.V.:3] Out: 2000 [Urine:2000]    Lab Results: BMET    Component Value Date/Time   NA 139 01/27/2020 0436   NA 136 01/26/2020 0427   NA 137 01/25/2020 1144   K 3.4 (L) 01/27/2020 0436   K 5.6 (H) 01/26/2020 0427   K 4.7 01/25/2020 1144   CL 90 (L) 01/27/2020 0436   CL 97 (L) 01/26/2020 0427   CL 97 (L) 01/25/2020 1144   CO2 38 (H) 01/27/2020 0436   CO2 29 01/26/2020 0427   CO2 29 01/25/2020 1144   GLUCOSE 124 (H) 01/27/2020 0436   GLUCOSE 129 (H) 01/26/2020 0427   GLUCOSE 202 (H) 01/25/2020 1144   BUN 27 (H) 01/27/2020 0436   BUN 32 (H) 01/26/2020 0427   BUN 40 (H) 01/25/2020 1144   CREATININE 0.83 01/27/2020 0436   CREATININE 0.86 01/26/2020 0427    CREATININE 1.28 (H) 01/25/2020 1144   CALCIUM 8.8 (L) 01/27/2020 0436   CALCIUM 8.8 (L) 01/26/2020 0427   CALCIUM 8.9 01/25/2020 1144   GFRNONAA >60 01/27/2020 0436   GFRNONAA >60 01/26/2020 0427   GFRNONAA 42 (L) 01/25/2020 1144   GFRAA >60 01/27/2020 0436   GFRAA >60 01/26/2020 0427   GFRAA 48 (L) 01/25/2020 1144   CBC    Component Value Date/Time   WBC 9.3 01/27/2020 0436   RBC 4.89 01/27/2020 0436   HGB 14.2 01/27/2020 0436   HCT 45.7 01/27/2020 0436   PLT 231 01/27/2020 0436   MCV 93.5 01/27/2020 0436   MCH 29.0 01/27/2020 0436   MCHC 31.1 01/27/2020 0436   RDW 15.8 (H) 01/27/2020 0436   LYMPHSABS 0.3 (L) 01/25/2020 0311   MONOABS 0.1 01/25/2020 0311   EOSABS 0.0 01/25/2020 0311   BASOSABS 0.0 01/25/2020 0311   HEPATIC Function Panel Recent Labs    01/25/20 1522 01/26/20 0427 01/27/20 0436  PROT 7.5 7.3 7.2   HEMOGLOBIN A1C No components found for: HGA1C,  MPG CARDIAC ENZYMES Lab Results  Component Value Date   CKTOTAL 27 (L) 01/25/2020   BNP No results for input(s): PROBNP in the last 8760 hours. TSH Recent Labs    01/26/20 0929  TSH 0.575   CHOLESTEROL No results for input(s): CHOL in the last 8760 hours.  Scheduled Meds: .  ALPRAZolam  0.25 mg Oral BID  . aspirin EC  81 mg Oral Daily  . digoxin  0.125 mg Oral Q M,W,F  . diltiazem  30 mg Oral TID  . docusate sodium  100 mg Oral BID  . DULoxetine  60 mg Oral Daily  . enoxaparin (LOVENOX) injection  40 mg Subcutaneous Q24H  . ferrous sulfate  325 mg Oral Q breakfast  . folic acid  1 mg Oral Daily  . gabapentin  100 mg Oral BID  . ipratropium-albuterol  3 mL Nebulization BID  . levETIRAcetam  500 mg Oral BID  . methylPREDNISolone (SOLU-MEDROL) injection  60 mg Intravenous BID  . pantoprazole  40 mg Oral BID  . polyethylene glycol  17 g Oral Daily  . sodium chloride flush  3 mL Intravenous Q12H  . thiamine  100 mg Oral Daily   Continuous Infusions: . sodium chloride 250 mL (01/27/20 0914)   . magnesium sulfate bolus IVPB 2 g (01/27/20 0916)   PRN Meds:.sodium chloride, albuterol, LORazepam, morphine injection, ondansetron **OR** ondansetron (ZOFRAN) IV, oxyCODONE  Assessment/Plan: Acute on chronic respiratory failure with hypoxia and hypercapnea COPD with pulmonary fibrosis Severe pulmonary hypertension Acute systolic left heart failure Abnormal Troponin I from heart failure, stable Abnormal liver enzymes from liver congestion, improving Hyperkalemia, resolved Rheumatoid arthritis Left AKA Vascular dementia Hyperlipidemia  Continue medical treatment. May have oral lasix once a week. Continue small dose diltiazem and lanoxin Continue oxygen 24/7. Discontinue atorvastatin.   LOS: 2 days   Time spent including chart review, lab review, examination, discussion with patient : 30 min   Orpah Cobb  MD  01/27/2020, 9:52 AM

## 2020-01-27 NOTE — Consult Note (Signed)
Consultation Note Date: 01/27/2020   Patient Name: Whitney Steele  DOB: September 28, 1945  MRN: [id number]  Age / Sex: 74 y.o., female  PCP: Blane Ohara, MD Referring Physician: Laverna Peace, MD  Reason for Consultation: Establishing goals of care  HPI/Patient Profile: 74 y.o. female  with past medical history of chronic respiratory failure on 3L oxygen secondary to COPD/pulmonary fibrosis, RA, hypothyroidism, dementia, HTN, HLD, and dysphagia d/t esophageal stricture admitted on 01/25/2020 with shortness of breath. Patient required bipap initially. Also found to have elevated BNP and cardiomegaly - concern for CHF. Also with elevated liver enzymes - improving. PMT consulted by CCM for GOC.  Clinical Assessment and Goals of Care: I have reviewed medical records including EPIC notes, labs and imaging, received report from RN, assessed the patient and then spoke with patient's daughter [name]  to discuss diagnosis prognosis, GOC, EOL wishes, disposition and options.  Spoke with patient some as well - not completely oriented but able to communicate some of her wishes as follows. Long conversation with daughter via phone.   I introduced Palliative Medicine as specialized medical care for people living with serious illness. It focuses on providing relief from the symptoms and stress of a serious illness. The goal is to improve quality of life for both the patient and the family.  Patient tells me she hasn't lived at current facility long - maybe a month - daughter tells me she has been there for 1.5 years. Daughter notes some loss of function - wheelchair bound and becoming more dependent in most ADLs. Daughter also tells me about baseline confusion and also episodes of agitation. Daughters states patient requires oxygen 24/7 - sats drop significantly if removed. She also has a bedsore on her sacrum that daughter states will note heal.    We discussed  patient's current illness and what it means in the larger context of patient's on-going co-morbidities.  Daughter expresses understanding of current situation. All questions and concerns addressed. Specifically discussed COPD and heart failure. Discussed decline in function and cognition.   I attempted to elicit values and goals of care important to the patient.  Daughter and patient both agree that DNR is appropriate but are both interested in full scope treatment otherwise. They are interested in rehospitalization if necessary.   Discussed with patient/family the importance of continued conversation with family and the medical providers regarding overall plan of care and treatment options, ensuring decisions are within the context of the patient's values and GOCs.     Palliative Care services outpatient were explained and offered. Daughter is agreeable to palliative follow up outpatient.   Questions and concerns were addressed. The family was encouraged to call with questions or concerns.   Primary Decision Maker NEXT OF KIN - daughter [name]    SUMMARY OF RECOMMENDATIONS   - DNR but full scope treatment otherwise - palliative to follow at SNF - [facility] consult - daughter interested in speaking about other placement options  Code Status/Advance Care Planning:  DNR  Prognosis:   Unable to determine  Discharge Planning: Skilled Nursing Facility for rehab with Palliative care service follow-up      Primary Diagnoses: Present on Admission: . Acute respiratory failure with hypoxia and hypercapnia (HCC) . Unilateral AKA, left (HCC) . AKI (acute kidney injury) (HCC) . Transaminitis . (Resolved) Fall . Hyperkalemia . DNR (do not resuscitate) . Elevated brain natriuretic peptide (BNP) level . Pulmonary fibrosis (HCC) . COPD (chronic obstructive pulmonary disease) (HCC)   I have  reviewed the medical record, interviewed the patient and family, and examined the patient. The following  aspects are pertinent.  Past Medical History:  Diagnosis Date  . Abnormal AST and ALT   . Allergic rhinitis   . Anemia   . Anxiety   . Atherosclerotic heart disease of native coronary artery with unspecified angina pectoris (HCC)   . Benign essential hypertension   . 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 COPD (chronic obstructive pulmonary disease) (HCC)   . Depression with anxiety   . Dysphagia   . Foley catheter in place   . GERD (gastroesophageal reflux disease)   . Hypercholesterolemia   . Hypertension   . Hypothyroidism   . Insomnia   . Neuromuscular dysfunction of bladder, unspecified   . Osteoporosis   . Other osteoporosis without current pathological fracture   . Other seizures (HCC)   . Pulmonary fibrosis (HCC)   . Rheumatoid arthritis (HCC)   . Unspecified protein-calorie malnutrition (HCC)   . Unstageable pressure ulcer of sacral region (HCC)   . Vascular dementia without behavioral disturbance [facility])    Social History   Socioeconomic History  . Marital status: Divorced    Spouse name: Not on file  . Number of children: Not on file  . Years of education: Not on file  . Highest education level: Not on file  Occupational History  . Not on file  Tobacco Use  . Smoking status: Never Smoker  . Smokeless tobacco: Never Used  Vaping Use  . Vaping Use: Never used  Substance and Sexual Activity  . Alcohol use: Not Currently  . Drug use: Never  . Sexual activity: Not on file  Other Topics Concern  . Not on file  Social History Narrative  . Not on file   Social Determinants of Health   Financial Resource Strain:   . Difficulty of Paying Living Expenses: Not on file  Food Insecurity:   . Worried About Programme researcher, broadcasting/film/video in the Last Year: Not on file  . Ran Out of Food in the Last Year: Not on file  Transportation Needs:   . Lack of Transportation (Medical): Not on file  . Lack of Transportation (Non-Medical): Not on file   Physical Activity:   . Days of Exercise per Week: Not on file  . Minutes of Exercise per Session: Not on file  Stress:   . Feeling of Stress : Not on file  Social Connections:   . Frequency of Communication with Friends and Family: Not on file  . Frequency of Social Gatherings with Friends and Family: Not on file  . Attends Religious Services: Not on file  . Active Member of Clubs or Organizations: Not on file  . Attends Banker Meetings: Not on file  . Marital Status: Not on file   Family History  Family history unknown: Yes   Scheduled Meds: . ALPRAZolam  0.25 mg Oral BID  . aspirin EC  81 mg Oral Daily  . digoxin  0.125 mg Oral Q M,W,F  . diltiazem  30 mg Oral TID  . docusate sodium  100 mg Oral BID  . DULoxetine  60 mg Oral Daily  . enoxaparin (LOVENOX) injection  40 mg Subcutaneous Q24H  . ferrous sulfate  325 mg Oral Q breakfast  . folic acid  1 mg Oral Daily  . [START ON 01/31/2020] furosemide  20 mg Oral Q Mon  . gabapentin  100 mg Oral  BID  . ipratropium-albuterol  3 mL Nebulization QID  . levETIRAcetam  500 mg Oral BID  . pantoprazole  40 mg Oral BID  . polyethylene glycol  17 g Oral Daily  . [START ON 01/31/2020] potassium chloride  10 mEq Oral Q Mon  . [START ON 01/28/2020] predniSONE  40 mg Oral Q breakfast  . sodium chloride flush  3 mL Intravenous Q12H  . thiamine  100 mg Oral Daily   Continuous Infusions: . sodium chloride 250 mL (01/27/20 0914)   PRN Meds:.sodium chloride, albuterol, LORazepam, morphine injection, ondansetron **OR** ondansetron (ZOFRAN) IV, oxyCODONE Allergies  Allergen Reactions  . Iodinated Diagnostic Agents Hives  . Tape Other (See Comments)    Tears skin - please use paper tape ("allergic," per paperwork)   Review of Systems  Respiratory: Negative for shortness of breath.   Denies pain  Physical Exam Constitutional:      General: She is not in acute distress. Pulmonary:     Effort: Pulmonary effort is normal. No  tachypnea.  Neurological:     Mental Status: She is alert and oriented to person, place, and time.     Comments: Some confusion     Vital Signs: BP (!) 147/95 (BP Location: Left Arm)   Pulse 92   Temp 97.8 F (36.6 C) (Oral)   Resp 18   Ht 5\' 6"  (1.676 m)   Wt 54.4 kg   SpO2 100%   BMI 19.37 kg/m  Pain Scale: 0-10   Pain Score: Asleep   SpO2: SpO2: 100 % O2 Device:SpO2: 100 % O2 Flow Rate: .O2 Flow Rate (L/min): (S) 5 L/min (decreaed per SPO2 100% on 6 LPM)  IO: Intake/output summary:   Intake/Output Summary (Last 24 hours) at 01/27/2020 1313 Last data filed at 01/27/2020 1047 Gross per 24 hour  Intake 363 ml  Output 2350 ml  Net -1987 ml    LBM: Last BM Date:  (pta) Baseline Weight: Weight: 50.8 kg Most recent weight: Weight: 54.4 kg     Palliative Assessment/Data: PPS 40%   Time Total: 55 minutes Greater than 50%  of this time was spent counseling and coordinating care related to the above assessment and plan.  Gerlean Ren, DNP, AGNP-C Palliative Medicine Team [phone] Pager: [phone]

## 2020-01-27 NOTE — Progress Notes (Signed)
   [facility] Progress Note  CC:  Dysphagia, elevated LFTs  Subjective:  No complaints this AM.  No pain.  Esophagram will be this afternoon per her nurse.  Objective:  Vital signs in last 24 hours: Temp:  [97.4 F (36.3 C)-98.5 F (36.9 C)] 97.8 F (36.6 C) (08/19 0526) Pulse Rate:  [80-102] 92 (08/19 0526) Resp:  [16-25] 18 (08/19 0526) BP: (129-160)/(58-144) 147/95 (08/19 0526) SpO2:  [91 %-100 %] 100 % (08/19 0745) Weight:  [54.4 kg] 54.4 kg (08/18 1511) Last BM Date:  (pta) General:  Alert, elderly and frail, in NAD; bruising noted around mouth Heart:  Regular rate and rhythm; no murmurs Pulm:  CTAB.  No W/R/R. Abdomen:  Soft, non-distended.  BS present.  Non-tender. Extremities:  Without edema. Neurologic:  Alert and oriented;  grossly normal neurologically.  Intake/Output from previous day: 08/18 0701 - 08/19 0700 In: 363 [P.O.:360; I.V.:3] Out: 2000 [Urine:2000]  Lab Results: Recent Labs    01/25/20 0311 01/25/20 0342 01/25/20 0624 01/26/20 0427 01/27/20 0436  WBC 5.6  --   --  8.5 9.3  HGB 12.5   < > 12.9 12.8 14.2  HCT 41.9   < > 38.0 42.4 45.7  PLT 177  --   --  178 231   < > = values in this interval not displayed.   BMET Recent Labs    01/25/20 1144 01/26/20 0427 01/27/20 0436  NA 137 136 139  K 4.7 5.6* 3.4*  CL 97* 97* 90*  CO2 29 29 38*  GLUCOSE 202* 129* 124*  BUN 40* 32* 27*  CREATININE 1.28* 0.86 0.83  CALCIUM 8.9 8.8* 8.8*   LFT Recent Labs    01/25/20 1522 01/26/20 0427 01/27/20 0436  PROT 7.5   < > 7.2  ALBUMIN 2.7*   < > 2.5*  AST 733*   < > 271*  ALT 345*   < > 286*  ALKPHOS 132*   < > 101  BILITOT 0.9   < > 0.9  BILIDIR 0.2  --   --   IBILI 0.7  --   --    < > = values in this interval not displayed.   PT/INR Recent Labs    01/25/20 0311  LABPROT 17.1*  INR 1.5*   Hepatitis Panel Recent Labs    01/26/20 0929  HEPBSAG NON REACTIVE  HCVAB NON REACTIVE  HEPAIGM NON REACTIVE  HEPBIGM NON  REACTIVE    US Abdomen Limited  Result Date: 01/25/2020 CLINICAL DATA:  73-year-old female with elevated LFTs. EXAM: ULTRASOUND ABDOMEN LIMITED RIGHT UPPER QUADRANT COMPARISON:  Right upper quadrant ultrasound dated 02/14/2006. FINDINGS: Gallbladder: Cholecystectomy. Common bile duct: Diameter: 4 mm Liver: There is diffuse increased liver echogenicity most commonly seen in the setting of fatty infiltration. Superimposed inflammation or fibrosis is not excluded. Clinical correlation is recommended. Portal vein is patent on color Doppler imaging with normal direction of blood flow towards the liver. Other: There is a partially visualized and incompletely evaluated 2 cm right renal upper pole cyst. IMPRESSION: 1. Cholecystectomy. 2. Fatty liver. 3. Patent main portal vein with hepatopetal flow. Electronically Signed   By: Arash  Radparvar M.D.   On: 01/25/2020 18:14   DG Chest Port 1 View  Result Date: 01/26/2020 CLINICAL DATA:  Shortness of breath EXAM: PORTABLE CHEST 1 VIEW COMPARISON:  01/25/2020 FINDINGS: Bilateral chronic interstitial thickening. Bilateral emphysematous changes. No focal consolidation. No pleural effusion or pneumothorax. Heart and mediastinal contours are unremarkable. No acute   osseous abnormality. IMPRESSION: No active disease. Electronically Signed   By: Hetal  Patel   On: 01/26/2020 16:23   ECHOCARDIOGRAM COMPLETE  Result Date: 01/26/2020    ECHOCARDIOGRAM REPORT   Patient Name:   Whitney Steele Date of Exam: 01/26/2020 Medical Rec #:  [id number]          Height:       66.0 in Accession #:    [id number]         Weight:       112.0 lb Date of Birth:  06/08/1946          BSA:          1.563 m Patient Age:    73 years           BP:           122/69 mmHg Patient Gender: F                  HR:           105 bpm. Exam Location:  Inpatient Procedure: 2D Echo Indications:    cardiomyopathy  History:        Patient has prior history of Echocardiogram examinations, most                  recent 10/07/2018. COPD; Risk Factors:Hypertension and                 Dyslipidemia.  Sonographer:    Vijay Shankar RDCS (AE) Referring Phys: [id number] RONDELL A SMITH  Sonographer Comments: restricted mobility - patient unable to turn on left side. additionally, head of the patient unable to lower significantly, which made imaging challenging IMPRESSIONS  1. Marded right sided heart failure.  2. Left ventricular ejection fraction, by estimation, is 45 to 50%. The left ventricle has mildly decreased function. The left ventricle demonstrates global hypokinesis. Left ventricular diastolic parameters were normal.  3. Right ventricular systolic function is severely reduced. The right ventricular size is severely enlarged. There is severely elevated pulmonary artery systolic pressure.  4. Left atrial size was mildly dilated.  5. Right atrial size was severely dilated.  6. The mitral valve is abnormal. Mild mitral valve regurgitation. No evidence of mitral stenosis.  7. Tricuspid valve regurgitation is severe.  8. The aortic valve is tricuspid. Aortic valve regurgitation is mild. Mild to moderate aortic valve sclerosis/calcification is present, without any evidence of aortic stenosis.  9. The inferior vena cava is normal in size with greater than 50% respiratory variability, suggesting right atrial pressure of 3 mmHg. FINDINGS  Left Ventricle: Left ventricular ejection fraction, by estimation, is 45 to 50%. The left ventricle has mildly decreased function. The left ventricle demonstrates global hypokinesis. The left ventricular internal cavity size was normal in size. There is  no left ventricular hypertrophy. Left ventricular diastolic parameters were normal. Right Ventricle: The right ventricular size is severely enlarged. Right vetricular wall thickness was not assessed. Right ventricular systolic function is severely reduced. There is severely elevated pulmonary artery systolic pressure. The tricuspid regurgitant  velocity is 3.91 m/s, and with an assumed right atrial pressure of 10 mmHg, the estimated right ventricular systolic pressure is 71.2 mmHg. Left Atrium: Left atrial size was mildly dilated. Right Atrium: Right atrial size was severely dilated. Pericardium: There is no evidence of pericardial effusion. Mitral Valve: The mitral valve is abnormal. There is moderate thickening of the mitral valve leaflet(s). There is moderate calcification of the mitral valve leaflet(s). Normal mobility   of the mitral valve leaflets. Moderate mitral annular calcification. Mild mitral valve regurgitation. No evidence of mitral valve stenosis. Tricuspid Valve: The tricuspid valve is normal in structure. Tricuspid valve regurgitation is severe. No evidence of tricuspid stenosis. Aortic Valve: The aortic valve is tricuspid. Aortic valve regurgitation is mild. Mild to moderate aortic valve sclerosis/calcification is present, without any evidence of aortic stenosis. Pulmonic Valve: The pulmonic valve was normal in structure. Pulmonic valve regurgitation is not visualized. No evidence of pulmonic stenosis. Aorta: The aortic root is normal in size and structure. Venous: The inferior vena cava is normal in size with greater than 50% respiratory variability, suggesting right atrial pressure of 3 mmHg. IAS/Shunts: No atrial level shunt detected by color flow Doppler. Additional Comments: Marded right sided heart failure.  LEFT VENTRICLE PLAX 2D LVIDd:         4.20 cm LVIDs:         2.80 cm LV PW:         1.40 cm LV IVS:        1.00 cm LVOT diam:     2.00 cm LV SV:         35 LV SV Index:   23 LVOT Area:     3.14 cm  LEFT ATRIUM         Index      RIGHT ATRIUM           Index LA diam:    4.00 cm 2.56 cm/m RA Area:     25.90 cm                                RA Volume:   96.80 ml  61.93 ml/m  AORTIC VALVE LVOT Vmax:   73.10 cm/s LVOT Vmean:  43.500 cm/s LVOT VTI:    0.113 m TRICUSPID VALVE TR Peak grad:   61.2 mmHg TR Vmax:        391.00 cm/s   SHUNTS Systemic VTI:  0.11 m Systemic Diam: 2.00 cm Peter Nishan MD Electronically signed by Peter Nishan MD Signature Date/Time: 01/26/2020/10:51:37 AM    Final    Assessment / Plan: *   Elevated LFTs.  Improving. Likely shock liver.  Ultrasound showed fatty liver, patent portal vein and normal flow.  Acute viral hepatitis studies negative.  *   Long standing solid food dysphagia  *    Acute on chronic hypoxic/hypercarbic resp failure.  On O2, nebs, steroids.    *   AMS, delerium  *   Hyperkalemia, resolved.  *   AKI.  Improved.    *   ? Rhabdo, CK not elevated.  *   Elevated BNP  *   Coagulopathy.  INR 1.5.    **Waiting for barium esophagram to be performed later today. **Could ask SLP to perform MBS test to assess for aspiration risk, will defer decision to attending.  **Continue to monitor LFTs.   LOS: 2 days   Magnolia Mattila D. Efosa Treichler  01/27/2020, 9:25 AM   

## 2020-01-27 NOTE — Progress Notes (Signed)
[facility]  PROGRESS NOTE  Whitney Steele ZHY:[id number] DOB: 02/02/1946 DOA: 01/25/2020 PCP: Whitney Ohara, MD Admit date - 01/25/2020   Admitting Physician Whitney Braun, MD  Outpatient Primary MD for the patient is Steele, Whitney Mandes, MD  LOS - 2 Brief Narrative   Whitney Steele is a 74 y.o. year old female with medical history significant for chronic hypoxic respiratory failure on 3 L secondary to COPD/pulmonary fibrosis, RA, hypothyroidism, vascular dementia, HTN, HLD, dysphagia secondary to history of esophageal stricture who presented on 01/25/2020 with worsening shortness of breath at her facility and found to have O2 saturations in the mid 70s per EMS requiring nonrebreather to improve sats to 90%  Hospital course complicated requiring BiPAP after initial ABG revealed pH of 7.25 and PCO2 of 73 with PO2 of 84 in the ED.  Also noted to have elevated AST of 698, ALT of 289, BNP greater than 1000 with chest x-ray showing cardiomegaly emphysema and pulmonary fibrosis.  PCCM was consulted on admission.  Patient is able to transition down to nasal cannula without recurrent need of BiPAP.  Underwent esophagram which showed concern for potential peptic stricture and presbyesophagus.  Additionally TTE showed RVSP greater than 70 prompting cardiology consultation for further assistance  Subjective  Reports continued improvement in breathing.  Denies any worsening cough.  No chest pain.  A & P    Acute on chronic hypoxic/hypercapnic respiratory failure presumed secondary to combined COPD/CHF exacerbation, improving.  Baseline O2 of 3 L and currently on 4 L  to maintain normal respiratory effort.  Has not required BiPAP since admission day. Exacerbation likely being driven by chronic emphysema and pulmonary fibrosis.  Noted to have elevated BNP and cardiomegaly concerning for CHF however no peripheral edema noted on exam.  Covid test negative -Appreciate PCCM recommendations, de-escalate  from IV Solu-Medrol to oral prednisone l, scheduled inhalers, duo nebs 3 times daily -BiPAP nightly and as needed, will monitor in progressive unit -Encourage incentive spirometer, flutter valve -Goal SPO2 greater than 88%, continue to wean O2 as able  Severely reduced systolic function with RVSP of 71.  Suspect entered by chronic pulmonary hypertension in setting of known chronic emphysema/pulmonary fibrosis.  Did present with elevated BNP and cardiomegaly but clinically no overt signs of volume overload. -Cardiology recommends weekly dosing of Lasix, also added low-dose digoxin  -Daily weights, strict intake/output -Hold off on IV Lasix currently given no overt peripheral edema   Elevated AST/ALT, improving.  On admission AST peaked of a 733 ALT of 345, currently downtrending.  Suspect related to hepatic congestion from right-sided heart failure.  Hepatitis panel unremarkable, right upper quadrant ultrasound showed fatty liver -Appreciate GI recommendations   Dysphagia.  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  Patient extremely high risk for respiratory compromise with any EGD. -GI will discuss with patient's daughter risk versus benefits of potential EGD  AKI with hyperkalemia, resolved.  Peak creatinine of 1.3 on admission potassium 6.1, improved with Lokelma and IV fluids, suspect prerenal -Monitor BMP, avoid nephrotoxins, monitor output  Unwitnessed fall from bed.  No fractures on imaging.  CK unremarkable.  Nursing facility did not report any fall.  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  Alert and oriented x to person, place, intermittently to time following commands -Delirium  precautions  -continue  home Cymbalta, Xanax  HLD -Discontinue home Lipitor given elevated LFTs per cardiology  Pain -Continue home Neurontin for   Seizure history -Continue home Keppra -Seizure precautions   Family Communication  : None, will update daughter Whitney Steele  Code Status : DNR, discussed on day of admission  Disposition Plan  :  Patient is from skilled nursing facility. Anticipated d/c date: 2 to 3 days. Barriers to d/c or necessity for inpatient status:  Continued need to monitor respiratory status, currently assessing potential for EGD pending respiratory status and discussion with family, close monitoring of right-sided heart failure, c Consults  : GI, PCCM, cardiology  Procedures  : TTE, 8/18  DVT Prophylaxis  :  Lovenox   Lab Results  Component Value Date   PLT 231 01/27/2020    Diet :  Diet Order            Diet NPO time specified  Diet effective midnight                  Inpatient Medications Scheduled Meds: . ALPRAZolam  0.25 mg Oral BID  . aspirin EC  81 mg Oral Daily  . digoxin  0.125 mg Oral Q M,W,F  . diltiazem  30 mg Oral TID  . docusate sodium  100 mg Oral BID  . DULoxetine  60 mg Oral Daily  . enoxaparin (LOVENOX) injection  40 mg Subcutaneous Q24H  . ferrous sulfate  325 mg Oral Q breakfast  . folic acid  1 mg Oral Daily  . [START ON 01/31/2020] furosemide  20 mg Oral Q Mon  . gabapentin  100 mg Oral BID  . ipratropium-albuterol  3 mL Nebulization QID  . levETIRAcetam  500 mg Oral BID  . pantoprazole  40 mg Oral BID  . polyethylene glycol  17 g Oral Daily  . [START ON 01/31/2020] potassium chloride  10 mEq Oral Q Mon  . [START ON 01/28/2020] predniSONE  40 mg Oral Q breakfast  . sodium chloride flush  3 mL Intravenous Q12H  . thiamine  100 mg Oral Daily   Continuous Infusions: . sodium chloride 250 mL (01/27/20 0914)   PRN Meds:.sodium chloride, albuterol, lip balm, LORazepam, morphine injection, ondansetron **OR** ondansetron (ZOFRAN) IV,  oxyCODONE  Antibiotics  :   Anti-infectives (From admission, onward)   None       Objective   Vitals:   01/27/20 0526 01/27/20 0745 01/27/20 0934 01/27/20 1329  BP: (!) 147/95   125/80  Pulse: 92   90  Resp: 18   18  Temp: 97.8 F (36.6 C)   98.5 F (36.9 C)  TempSrc: Oral   Oral  SpO2: 99% 100%  95%  Weight:   54.4 kg   Height:        SpO2: 95 % O2 Flow Rate (L/min): 3 L/min FiO2 (%): 35 %  Wt Readings from Last 3 Encounters:  01/27/20 54.4 kg  01/13/20 50.8 kg  11/02/19 54.4 kg     Intake/Output Summary (Last 24 hours) at 01/27/2020 1505 Last data filed at 01/27/2020 1047 Gross per 24 hour  Intake 363 ml  Output 2350 ml  Net -1987 ml    Physical Exam:     Awake Alert, oriented to self, place, context, not to time, normal affect Right hand grip decreased strength (chronic with arthritic changes in bilateral hands from RA) Diffuse crackles in all lung fields, normal respiratory effort on 4 L, no wheezing Left AKA Bruising  around mouth, dry oral mucosa Regular rate and rhythm, SEM appreciated, no edema Abdomen soft, nondistended, nontender     I have personally reviewed the following:   Data Reviewed:  CBC Recent Labs  Lab 01/25/20 0311 01/25/20 0342 01/25/20 0624 01/26/20 0427 01/27/20 0436  WBC 5.6  --   --  8.5 9.3  HGB 12.5 13.3 12.9 12.8 14.2  HCT 41.9 39.0 38.0 42.4 45.7  PLT 177  --   --  178 231  MCV 97.7  --   --  94.9 93.5  MCH 29.1  --   --  28.6 29.0  MCHC 29.8*  --   --  30.2 31.1  RDW 15.3  --   --  15.5 15.8*  LYMPHSABS 0.3*  --   --   --   --   MONOABS 0.1  --   --   --   --   EOSABS 0.0  --   --   --   --   BASOSABS 0.0  --   --   --   --     Chemistries  Recent Labs  Lab 01/25/20 0311 01/25/20 0311 01/25/20 0342 01/25/20 0559 01/25/20 0624 01/25/20 1144 01/25/20 1522 01/26/20 0427 01/27/20 0436  NA 136   < > 137  --  136 137  --  136 139  K 6.7*   < > 6.4*  --  6.1* 4.7  --  5.6* 3.4*  CL 98  --   --    --   --  97*  --  97* 90*  CO2 28  --   --   --   --  29  --  29 38*  GLUCOSE 120*  --   --  120*  --  202*  --  129* 124*  BUN 42*  --   --   --   --  40*  --  32* 27*  CREATININE 1.35*  --   --   --   --  1.28*  --  0.86 0.83  CALCIUM 9.0  --   --   --   --  8.9  --  8.8* 8.8*  MG  --   --   --   --   --   --   --  1.7 1.7  AST 698*  --   --   --   --   --  733* 411* 271*  ALT 289*  --   --   --   --   --  345* 311* 286*  ALKPHOS 150*  --   --   --   --   --  132* 122 101  BILITOT 1.2  --   --   --   --   --  0.9 1.0 0.9   < > = values in this interval not displayed.   ------------------------------------------------------------------------------------------------------------------ No results for input(s): CHOL, HDL, LDLCALC, TRIG, CHOLHDL, LDLDIRECT in the last 72 hours.  No results found for: HGBA1C ------------------------------------------------------------------------------------------------------------------ Recent Labs    01/26/20 0929  TSH 0.575   ------------------------------------------------------------------------------------------------------------------ No results for input(s): VITAMINB12, FOLATE, FERRITIN, TIBC, IRON, RETICCTPCT in the last 72 hours.  Coagulation profile Recent Labs  Lab 01/25/20 0311  INR 1.5*    No results for input(s): DDIMER in the last 72 hours.  Cardiac Enzymes No results for input(s): CKMB, TROPONINI, MYOGLOBIN in the last 168 hours.  Invalid input(s): CK ------------------------------------------------------------------------------------------------------------------    Component Value Date/Time  BNP 1,062.7 (H) 01/25/2020 0981    Micro Results Recent Results (from the past 240 hour(s))  Culture, blood (Routine x 2)     Status: None (Preliminary result)   Collection Time: 01/25/20  3:00 AM   Specimen: BLOOD  Result Value Ref Range Status   Specimen Description BLOOD RIGHT ARM  Final   Special Requests   Final    BOTTLES  DRAWN AEROBIC AND ANAEROBIC Blood Culture results may not be optimal due to an excessive volume of blood received in culture bottles   Culture   Final    NO GROWTH 2 DAYS Performed at [facility], [address] [place], [place] [address]    Report Status PENDING  Incomplete  Culture, blood (Routine x 2)     Status: None (Preliminary result)   Collection Time: 01/25/20  3:10 AM   Specimen: BLOOD  Result Value Ref Range Status   Specimen Description BLOOD RIGHT HAND  Final   Special Requests   Final    BOTTLES DRAWN AEROBIC ONLY Blood Culture adequate volume   Culture   Final    NO GROWTH 2 DAYS Performed at [facility], [address] [place], [place] [address]    Report Status PENDING  Incomplete  SARS Coronavirus 2 by RT PCR (hospital order, performed in [facility] hospital lab) Nasopharyngeal Nasopharyngeal Swab     Status: None   Collection Time: 01/25/20  3:26 AM   Specimen: Nasopharyngeal Swab  Result Value Ref Range Status   SARS Coronavirus 2 NEGATIVE NEGATIVE Final    Comment: (NOTE) SARS-CoV-2 target nucleic acids are NOT DETECTED.  The SARS-CoV-2 RNA is generally detectable in upper and lower respiratory specimens during the acute phase of infection. The lowest concentration of SARS-CoV-2 viral copies this assay can detect is 250 copies / mL. A negative result does not preclude SARS-CoV-2 infection and should not be used as the sole basis for treatment or other patient management decisions.  A negative result may occur with improper specimen collection / handling, submission of specimen other than nasopharyngeal swab, presence of viral mutation(s) within the areas targeted by this assay, and inadequate number of viral copies (<250 copies / mL). A negative result must be combined with clinical observations, patient history, and epidemiological information.  Fact Sheet for Patients:   BoilerBrush.com.cy  Fact Sheet for  Healthcare Providers: https://pope.com/  This test is not yet approved or  cleared by the [place] FDA and has been authorized for detection and/or diagnosis of SARS-CoV-2 by FDA under an Emergency Use Authorization (EUA).  This EUA will remain in effect (meaning this test can be used) for the duration of the COVID-19 declaration under Section 564(b)(1) of the Act, 21 U.S.C. section 360bbb-3(b)(1), unless the authorization is terminated or revoked sooner.  Performed at [facility], [address] [place], [place] [address]     Radiology Reports US Abdomen Limited  Result Date: 01/25/2020 CLINICAL DATA:  74 year old female with elevated LFTs. EXAM: ULTRASOUND ABDOMEN LIMITED RIGHT UPPER QUADRANT COMPARISON:  Right upper quadrant ultrasound dated 02/14/2006. FINDINGS: Gallbladder: Cholecystectomy. Common bile duct: Diameter: 4 mm Liver: There is diffuse increased liver echogenicity most commonly seen in the setting of fatty infiltration. Superimposed inflammation or fibrosis is not excluded. Clinical correlation is recommended. Portal vein is patent on color Doppler imaging with normal direction of blood flow towards the liver. Other: There is a partially visualized and incompletely evaluated 2 cm right renal upper pole  cyst. IMPRESSION: 1. Cholecystectomy. 2. Fatty liver. 3. Patent main portal vein with hepatopetal flow. Electronically Signed   By: Elgie Collard M.D.   On: 01/25/2020 18:14   DG Chest Port 1 View  Result Date: 01/26/2020 CLINICAL DATA:  Shortness of breath EXAM: PORTABLE CHEST 1 VIEW COMPARISON:  01/25/2020 FINDINGS: Bilateral chronic interstitial thickening. Bilateral emphysematous changes. No focal consolidation. No pleural effusion or pneumothorax. Heart and mediastinal contours are unremarkable. No acute osseous abnormality. IMPRESSION: No active disease. Electronically Signed   By: Elige Ko   On: 01/26/2020 16:23   DG Chest  Portable 1 View  Result Date: 01/25/2020 CLINICAL DATA:  Shortness of breath. EXAM: PORTABLE CHEST 1 VIEW COMPARISON:  10/13/2019, CT 08/07/2018 FINDINGS: Mild cardiomegaly. Unchanged mediastinal contours. Emphysema with interstitial coarsening. Bibasilar reticulation consistent with honeycombing. No acute or confluent airspace disease. No pleural effusion or pneumothorax. Bones are under mineralized. No acute osseous abnormalities are seen. IMPRESSION: 1. Cardiomegaly. 2. Emphysema.  Basilar honeycombing/fibrosis. Electronically Signed   By: Narda Rutherford M.D.   On: 01/25/2020 03:10   ECHOCARDIOGRAM COMPLETE  Result Date: 01/26/2020    ECHOCARDIOGRAM REPORT   Patient Name:   CYRAH MCLAMB Rogel Date of Exam: 01/26/2020 Medical Rec #:  [id number]          Height:       66.0 in Accession #:    [id number]         Weight:       112.0 lb Date of Birth:  21-Mar-1946          BSA:          1.563 m Patient Age:    73 years           BP:           122/69 mmHg Patient Gender: F                  HR:           105 bpm. Exam Location:  Inpatient Procedure: 2D Echo Indications:    cardiomyopathy  History:        Patient has prior history of Echocardiogram examinations, most                 recent 10/07/2018. COPD; Risk Factors:Hypertension and                 Dyslipidemia.  Sonographer:    Celene Skeen RDCS (AE) Referring Phys: [phone] [facility]  Sonographer Comments: restricted mobility - patient unable to turn on left side. additionally, head of the patient unable to lower significantly, which made imaging challenging IMPRESSIONS  1. Marded right sided heart failure.  2. Left ventricular ejection fraction, by estimation, is 45 to 50%. The left ventricle has mildly decreased function. The left ventricle demonstrates global hypokinesis. Left ventricular diastolic parameters were normal.  3. Right ventricular systolic function is severely reduced. The right ventricular size is severely enlarged. There is severely  elevated pulmonary artery systolic pressure.  4. Left atrial size was mildly dilated.  5. Right atrial size was severely dilated.  6. The mitral valve is abnormal. Mild mitral valve regurgitation. No evidence of mitral stenosis.  7. Tricuspid valve regurgitation is severe.  8. The aortic valve is tricuspid. Aortic valve regurgitation is mild. Mild to moderate aortic valve sclerosis/calcification is present, without any evidence of aortic stenosis.  9. The inferior vena cava is normal in size with greater than 50% respiratory variability, suggesting right  atrial pressure of 3 mmHg. FINDINGS  Left Ventricle: Left ventricular ejection fraction, by estimation, is 45 to 50%. The left ventricle has mildly decreased function. The left ventricle demonstrates global hypokinesis. The left ventricular internal cavity size was normal in size. There is  no left ventricular hypertrophy. Left ventricular diastolic parameters were normal. Right Ventricle: The right ventricular size is severely enlarged. Right vetricular wall thickness was not assessed. Right ventricular systolic function is severely reduced. There is severely elevated pulmonary artery systolic pressure. The tricuspid regurgitant velocity is 3.91 m/s, and with an assumed right atrial pressure of 10 mmHg, the estimated right ventricular systolic pressure is 71.2 mmHg. Left Atrium: Left atrial size was mildly dilated. Right Atrium: Right atrial size was severely dilated. Pericardium: There is no evidence of pericardial effusion. Mitral Valve: The mitral valve is abnormal. There is moderate thickening of the mitral valve leaflet(s). There is moderate calcification of the mitral valve leaflet(s). Normal mobility of the mitral valve leaflets. Moderate mitral annular calcification. Mild mitral valve regurgitation. No evidence of mitral valve stenosis. Tricuspid Valve: The tricuspid valve is normal in structure. Tricuspid valve regurgitation is severe. No evidence of  tricuspid stenosis. Aortic Valve: The aortic valve is tricuspid. Aortic valve regurgitation is mild. Mild to moderate aortic valve sclerosis/calcification is present, without any evidence of aortic stenosis. Pulmonic Valve: The pulmonic valve was normal in structure. Pulmonic valve regurgitation is not visualized. No evidence of pulmonic stenosis. Aorta: The aortic root is normal in size and structure. Venous: The inferior vena cava is normal in size with greater than 50% respiratory variability, suggesting right atrial pressure of 3 mmHg. IAS/Shunts: No atrial level shunt detected by color flow Doppler. Additional Comments: Marded right sided heart failure.  LEFT VENTRICLE PLAX 2D LVIDd:         4.20 cm LVIDs:         2.80 cm LV PW:         1.40 cm LV IVS:        1.00 cm LVOT diam:     2.00 cm LV SV:         35 LV SV Index:   23 LVOT Area:     3.14 cm  LEFT ATRIUM         Index      RIGHT ATRIUM           Index LA diam:    4.00 cm 2.56 cm/m RA Area:     25.90 cm                                RA Volume:   96.80 ml  61.93 ml/m  AORTIC VALVE LVOT Vmax:   73.10 cm/s LVOT Vmean:  43.500 cm/s LVOT VTI:    0.113 m TRICUSPID VALVE TR Peak grad:   61.2 mmHg TR Vmax:        391.00 cm/s  SHUNTS Systemic VTI:  0.11 m Systemic Diam: 2.00 cm Charlton Haws MD Electronically signed by Charlton Haws MD Signature Date/Time: 01/26/2020/10:51:37 AM    Final    DG ESOPHAGUS W SINGLE CM (SOL OR THIN BA)  Result Date: 01/27/2020 CLINICAL DATA:  74 year old female inpatient with COPD and dementia with reported dysphagia and failure to thrive. EXAM: ESOPHOGRAM/BARIUM SWALLOW TECHNIQUE: Single contrast examination was performed using  thin barium. FLUOROSCOPY TIME:  Fluoroscopy Time:  1 minutes 18 seconds Number of Acquired Spot Images: 1 COMPARISON:  08/07/2026 chest CT. FINDINGS: Examination significantly limited by patient mental status and mobility limitations. Examination performed with the patient in the left lateral decubitus  position with slight head elevation. No laryngeal penetration or tracheobronchial aspiration observed. Small to moderate hiatal hernia. Unable to assess for gastroesophageal reflux. No gross esophageal mass or ulcer. A small traction diverticulum is noted along the right midthoracic esophagus. There is a suggestion of a short segment of mild smooth narrowing in the lower thoracic esophagus at the esophagogastric junction. Otherwise normal esophageal distensibility. Suggestion of moderate esophageal dysmotility with proximal escape and tertiary contractions. Barium tablet not administered due to above patient limitations. IMPRESSION: 1. Significantly limited study, see comments. 2. Small to moderate hiatal hernia. 3. Suggestion of a short segment of mild smooth narrowing in the lower thoracic esophagus at the esophagogastric junction, cannot exclude a mild peptic stricture, see comments. No gross esophageal mass. 4. 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 5. Small traction diverticulum along the right mid thoracic esophagus. 6. No laryngeal penetration or tracheobronchial aspiration observed. Electronically Signed   By: Delbert Phenix M.D.   On: 01/27/2020 11:36     Time Spent in minutes  30     Laverna Peace M.D on 01/27/2020 at 3:05 PM  To page go to [email or web address] - password [facility]

## 2020-01-28 ENCOUNTER — Inpatient Hospital Stay (HOSPITAL_COMMUNITY): Payer: Medicare Other | Admitting: Anesthesiology

## 2020-01-28 ENCOUNTER — Encounter (HOSPITAL_COMMUNITY): Payer: Self-pay | Admitting: Internal Medicine

## 2020-01-28 ENCOUNTER — Encounter (HOSPITAL_COMMUNITY)
Admission: EM | Disposition: A | Discharge status: Skilled Nursing Facility | Payer: Self-pay | Source: Skilled Nursing Facility | Attending: Internal Medicine

## 2020-01-28 DIAGNOSIS — R109 Unspecified abdominal pain: Secondary | ICD-10-CM

## 2020-01-28 DIAGNOSIS — K224 Dyskinesia of esophagus: Secondary | ICD-10-CM

## 2020-01-28 DIAGNOSIS — Q399 Congenital malformation of esophagus, unspecified: Secondary | ICD-10-CM

## 2020-01-28 DIAGNOSIS — K222 Esophageal obstruction: Secondary | ICD-10-CM

## 2020-01-28 DIAGNOSIS — R933 Abnormal findings on diagnostic imaging of other parts of digestive tract: Secondary | ICD-10-CM

## 2020-01-28 DIAGNOSIS — I272 Pulmonary hypertension, unspecified: Secondary | ICD-10-CM

## 2020-01-28 DIAGNOSIS — R1013 Epigastric pain: Secondary | ICD-10-CM

## 2020-01-28 HISTORY — PX: ESOPHAGEAL DILATION: SHX303

## 2020-01-28 HISTORY — PX: ESOPHAGOGASTRODUODENOSCOPY (EGD) WITH PROPOFOL: SHX5813

## 2020-01-28 LAB — URINALYSIS, ROUTINE W REFLEX MICROSCOPIC
Bilirubin Urine: NEGATIVE
Glucose, UA: NEGATIVE mg/dL
Hgb urine dipstick: NEGATIVE
Ketones, ur: NEGATIVE mg/dL
Nitrite: NEGATIVE
Protein, ur: NEGATIVE mg/dL
Specific Gravity, Urine: 1.016 (ref 1.005–1.030)
pH: 6 (ref 5.0–8.0)

## 2020-01-28 LAB — COMPREHENSIVE METABOLIC PANEL
ALT: 225 U/L — ABNORMAL HIGH (ref 0–44)
AST: 141 U/L — ABNORMAL HIGH (ref 15–41)
Albumin: 2.6 g/dL — ABNORMAL LOW (ref 3.5–5.0)
Alkaline Phosphatase: 93 U/L (ref 38–126)
Anion gap: 9 (ref 5–15)
BUN: 33 mg/dL — ABNORMAL HIGH (ref 8–23)
CO2: 34 mmol/L — ABNORMAL HIGH (ref 22–32)
Calcium: 8.5 mg/dL — ABNORMAL LOW (ref 8.9–10.3)
Chloride: 92 mmol/L — ABNORMAL LOW (ref 98–111)
Creatinine, Ser: 0.9 mg/dL (ref 0.44–1.00)
GFR calc Af Amer: >60 mL/min (ref >60)
GFR calc non Af Amer: >60 mL/min (ref >60)
Glucose, Bld: 92 mg/dL (ref 70–99)
Potassium: 3.8 mmol/L (ref 3.5–5.1)
Sodium: 135 mmol/L (ref 135–145)
Total Bilirubin: 1.2 mg/dL (ref 0.3–1.2)
Total Protein: 6.8 g/dL (ref 6.5–8.1)

## 2020-01-28 LAB — CBC
HCT: 47.7 % — ABNORMAL HIGH (ref 36.0–46.0)
Hemoglobin: 14.5 g/dL (ref 12.0–15.0)
MCH: 28.4 pg (ref 26.0–34.0)
MCHC: 30.4 g/dL (ref 30.0–36.0)
MCV: 93.3 fL (ref 80.0–100.0)
Platelets: 200 10*3/uL (ref 150–400)
RBC: 5.11 MIL/uL (ref 3.87–5.11)
RDW: 15.6 % — ABNORMAL HIGH (ref 11.5–15.5)
WBC: 11 10*3/uL — ABNORMAL HIGH (ref 4.0–10.5)
nRBC: 0 % (ref 0.0–0.2)

## 2020-01-28 LAB — LIPASE, BLOOD: Lipase: 44 U/L (ref 11–51)

## 2020-01-28 LAB — PHOSPHORUS: Phosphorus: 2.4 mg/dL — ABNORMAL LOW (ref 2.5–4.6)

## 2020-01-28 LAB — MRSA PCR SCREENING: MRSA by PCR: POSITIVE — AB

## 2020-01-28 LAB — GLUCOSE, CAPILLARY: Glucose-Capillary: 105 mg/dL — ABNORMAL HIGH (ref 70–99)

## 2020-01-28 LAB — MAGNESIUM: Magnesium: 2 mg/dL (ref 1.7–2.4)

## 2020-01-28 SURGERY — ESOPHAGOGASTRODUODENOSCOPY (EGD) WITH PROPOFOL
Anesthesia: Monitor Anesthesia Care

## 2020-01-28 MED ORDER — PROPOFOL 500 MG/50ML IV EMUL
INTRAVENOUS | Status: DC | PRN
  Administered 2020-01-28: 75 ug/kg/min via INTRAVENOUS

## 2020-01-28 MED ORDER — CHLORHEXIDINE GLUCONATE CLOTH 2 % EX PADS
6.0000 | MEDICATED_PAD | Freq: Every day | CUTANEOUS | Status: AC
  Administered 2020-01-28 – 2020-02-01 (×4): 6 via TOPICAL

## 2020-01-28 MED ORDER — MUPIROCIN 2 % EX OINT
1.0000 "application " | TOPICAL_OINTMENT | Freq: Two times a day (BID) | CUTANEOUS | Status: AC
  Administered 2020-01-28 – 2020-02-01 (×10): 1 via NASAL
  Filled 2020-01-28 (×3): qty 22

## 2020-01-28 MED ORDER — LACTATED RINGERS IV SOLN: INTRAVENOUS | Status: DC

## 2020-01-28 MED ORDER — IPRATROPIUM-ALBUTEROL 0.5-2.5 (3) MG/3ML IN SOLN
3.0000 mL | Freq: Four times a day (QID) | RESPIRATORY_TRACT | Status: DC | PRN
  Administered 2020-02-03: 3 mL via RESPIRATORY_TRACT
  Filled 2020-01-28: qty 3

## 2020-01-28 MED ORDER — LACTATED RINGERS IV SOLN: INTRAVENOUS | Status: DC | PRN

## 2020-01-28 MED ORDER — SODIUM CHLORIDE 0.9 % IV SOLN: INTRAVENOUS | Status: DC

## 2020-01-28 SURGICAL SUPPLY — 15 items

## 2020-01-28 NOTE — Interval H&P Note (Signed)
History and Physical Interval Note: Plan is for upper endoscopy today for evaluation of dysphagia, abnormal barium esophagram suggestive of esophageal stricture above a large hiatal hernia.  Long and thorough discussion with the patient who seems more alert and oriented today regarding this procedure as well as with her daughter Silvio Pate.  I spoke to [place] on 3 separate occasions today to discuss the elevated risk nature of this upper endoscopy.  She was able to communicate with me that her mother wishes to proceed even in face of higher than average risk because she is interested in being able to eat more than soft and pured foods.  For her eating is comfort and they are willing to accept the elevated risks.  We discussed the risk of cardiopulmonary complications with anesthesia, infection, perforation, aspiration and even death.   01-29-20 2:16 PM  Allyne Gee  has presented today for surgery, with the diagnosis of dysphagia, abnormal barium esophagram.  The various methods of treatment have been discussed with the patient and family. After consideration of risks, benefits and other options for treatment, the patient has consented to  Procedure(s): ESOPHAGOGASTRODUODENOSCOPY (EGD) WITH PROPOFOL (N/A) ESOPHAGEAL DILATION as a surgical intervention.  The patient's history has been reviewed, patient examined, no change in status, stable for surgery.  I have reviewed the patient's chart and labs.  Questions were answered to the patient's satisfaction.     Carie Caddy Dehlia Kilner

## 2020-01-28 NOTE — Progress Notes (Signed)
Patient refused to wear BIPAP tonight said she does not wear it at home and she is doing good without it. RT will continue to monitor.

## 2020-01-28 NOTE — Anesthesia Preprocedure Evaluation (Addendum)
Anesthesia Evaluation  Patient identified by MRN, date of birth, ID band Patient awake    Reviewed: Allergy & Precautions, H&P , NPO status , Patient's Chart, lab work & pertinent test results  Airway Mallampati: II  TM Distance: >3 FB Neck ROM: Full    Dental  (+) Edentulous Upper, Poor Dentition, Missing   Pulmonary COPD,  COPD inhaler,    Pulmonary exam normal breath sounds clear to auscultation       Cardiovascular hypertension, Pt. on medications + angina with exertion + CAD   Rhythm:Regular Rate:Normal     Neuro/Psych Seizures -, Well Controlled,  PSYCHIATRIC DISORDERS Anxiety Depression Dementia    GI/Hepatic Neg liver ROS, GERD  Medicated and Controlled,Dysphagia Abnormal esophagogram   Endo/Other  Hypothyroidism   Renal/GU Renal InsufficiencyRenal disease  negative genitourinary   Musculoskeletal  (+) Arthritis , Rheumatoid disorders,    Abdominal   Peds  Hematology  (+) Blood dyscrasia, anemia ,   Anesthesia Other Findings   Reproductive/Obstetrics negative OB ROS                             Anesthesia Physical  Anesthesia Plan  ASA: III  Anesthesia Plan: MAC   Post-op Pain Management:    Induction: Intravenous  PONV Risk Score and Plan: 4 or greater and 2 and Propofol infusion  Airway Management Planned: Natural Airway and Nasal Cannula  Additional Equipment:   Intra-op Plan:   Post-operative Plan: Extubation in OR  Informed Consent: I have reviewed the patients History and Physical, chart, labs and discussed the procedure including the risks, benefits and alternatives for the proposed anesthesia with the patient or authorized representative who has indicated his/her understanding and acceptance.   Patient has DNR.  Suspend DNR and Discussed DNR with patient.   Dental advisory given  Plan Discussed with: CRNA and Anesthesiologist  Anesthesia Plan  Comments:        Anesthesia Quick Evaluation

## 2020-01-28 NOTE — Consult Note (Signed)
   [facility] Inpatient Consult   01/28/2020  Whitney Steele September 17, 1945 [id number]  [facility] [[facility]]  Accountable Care Organization [ACO] Patient: Medicare   Patient screened for high risk score for unplanned readmission score and for potential [facility]  [[facility]] Care Management service needs.  Review of patient's medical record reveals patient is from LTC facility and plan to return, also reviewed Palliative Care consult note.  Plan:  No [facility] Care Management needs currently if returns to long term care facility.  Will sign off at transition.  For questions contact:   Charlesetta Shanks, RN BSN CCM [facility]  [phone] business mobile phone Toll free office [phone]  Fax number: [phone] [email or web address]

## 2020-01-28 NOTE — Progress Notes (Signed)
[facility]  PROGRESS NOTE  Whitney Steele:[id number] DOB: 05-Aug-1945 DOA: 01/25/2020 PCP: Blane Ohara, MD Admit date - 01/25/2020   Admitting Physician Clydie Braun, MD  Outpatient Primary MD for the patient is [name], [name], MD  LOS - 3 Brief Narrative   Whitney Steele is a 74 y.o. year old female with medical history significant for chronic hypoxic respiratory failure on 3 L secondary to COPD/pulmonary fibrosis, RA, hypothyroidism, vascular dementia, HTN, HLD, dysphagia secondary to history of esophageal stricture who presented on 01/25/2020 with worsening shortness of breath at her facility and found to have O2 saturations in the mid 70s per EMS requiring nonrebreather to improve sats to 90%  Hospital course complicated requiring BiPAP after initial ABG revealed pH of 7.25 and PCO2 of 73 with PO2 of 84 in the ED.  Also noted to have elevated AST of 698, ALT of 289, BNP greater than 1000 with chest x-ray showing cardiomegaly emphysema and pulmonary fibrosis.  PCCM was consulted on admission.  Patient is able to transition down to nasal cannula without recurrent need of BiPAP.  Underwent esophagram which showed concern for potential peptic stricture and presbyesophagus.  Additionally TTE showed RVSP greater than 70 prompting cardiology consultation for further assistance.  Barium esophagram consistent with presbyesophagus as well as possible area of stricture.  Subjective  States breathing is stable.  Reports some abdominal pain in upper abdomen, having good bowel movements.  Overnight bladder scan around 350 cc.  A & P    Acute on chronic hypoxic/hypercapnic respiratory failure presumed secondary to combined COPD/CHF exacerbation, improving.  Baseline O2 of 3 L and currently 4 to 5 L high flow, however patient is maintaining oxygen saturation of 100%, looks comfortable respiratory status, no wheezing on exam. Has not required BiPAP since admission day. Exacerbation likely  being driven by chronic emphysema and pulmonary fibrosis and further complicated by severe pulmonary hypertension.   Covid test negative on admission -Appreciate PCCM recommendations, continue oral prednisone burst, as needed inhalers scheduled inhalers, duo nebs 3 times daily -BiPAP nightly and as needed,  monitor in progressive unit -Encourage incentive spirometer, flutter valve -Goal SPO2 greater than 88%, continue to wean O2 as able  Severely reduced systolic function with RVSP of 71.  Suspect related to chronic pulmonary hypertension in setting of known chronic emphysema/pulmonary fibrosis.  Did present with elevated BNP and cardiomegaly but clinically no overt signs of volume overload. -Cardiology recommends weekly dosing of Lasix, also added low-dose digoxin  -Daily weights, strict intake/output -Hold off on IV Lasix currently given no overt peripheral edema   Elevated AST/ALT, improving.  On admission AST peaked of a 733 ALT of 345, currently downtrending.  Suspect related to hepatic congestion from right-sided heart failure.  Hepatitis panel unremarkable, right upper quadrant ultrasound showed fatty liver -Appreciate GI recommendations  Upper abdominal pain.  Reproducible on exam without any guarding.  LFTs continue to downtrend, right upper quadrant ultrasound shows no acute pathology.  Having BMs -Check lipase -Continue to monitor, if no improvement will assess with repeat imaging  Dysphagia.  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  Patient extremely high risk for respiratory compromise with any EGD.  Daughter aware of risk versus benefits after discussing with GI consultants. -We will continue to optimize pulmonary status (weaning O2) -Ending assessment respiratory status patient may undergo EGD for palliative dilatation of possible stricture  AKI with hyperkalemia, resolved.  Peak creatinine of 1.3 on admission potassium 6.1, improved with  Lokelma and IV fluids, suspect prerenal -Monitor BMP, avoid nephrotoxins, monitor output  Unwitnessed fall from bed.  No fractures on imaging.  CK unremarkable.  Nursing facility did not report any fall.  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  Alert and oriented x to person, place, intermittently to time following commands -Delirium precautions  -continue home Cymbalta, Xanax  HLD -Discontinued home Lipitor given elevated LFTs per cardiology  Pain -Continue home Neurontin for   Seizure history -Continue home Keppra -Seizure precautions   Family Communication  : None, will update daughter [name]  Code Status : DNR, discussed on day of admission  Disposition Plan  :  Patient is from skilled nursing facility. Anticipated d/c date: 2 to 3 days. Barriers to d/c or necessity for inpatient status:  Continued need to monitor respiratory status--weaning from high flow nasal cannula, currently assessing potential for EGD pending respiratory status, close monitoring of right-sided heart failure, c Consults  : GI, PCCM, cardiology  Procedures  : TTE, 8/18  DVT Prophylaxis  :  Lovenox   Lab Results  Component Value Date   PLT 200 01/28/2020    Diet :  Diet Order            Diet NPO time specified Except for: Sips with Meds  Diet effective now                  Inpatient Medications Scheduled Meds: . ALPRAZolam  0.25 mg Oral BID  . aspirin EC  81 mg Oral Daily  . Chlorhexidine Gluconate Cloth  6 each Topical Q0600  . digoxin  0.125 mg Oral Q M,W,F  . diltiazem  30 mg Oral TID  . docusate sodium  100 mg Oral BID  . DULoxetine  60 mg Oral Daily  . enoxaparin (LOVENOX) injection  40 mg Subcutaneous Q24H  . ferrous sulfate  325 mg Oral Q  breakfast  . folic acid  1 mg Oral Daily  . [START ON 01/31/2020] furosemide  20 mg Oral Q Mon  . gabapentin  100 mg Oral BID  . levETIRAcetam  500 mg Oral BID  . mupirocin ointment  1 application Nasal BID  . pantoprazole  40 mg Oral BID  . polyethylene glycol  17 g Oral Daily  . [START ON 01/31/2020] potassium chloride  10 mEq Oral Q Mon  . predniSONE  40 mg Oral Q breakfast  . sodium chloride flush  3 mL Intravenous Q12H  . thiamine  100 mg Oral Daily   Continuous Infusions: . sodium chloride 250 mL (01/27/20 0914)   PRN Meds:.sodium chloride, albuterol, lip balm, LORazepam, morphine injection, ondansetron **OR** ondansetron (ZOFRAN) IV, oxyCODONE  Antibiotics  :   Anti-infectives (From admission, onward)   None       Objective   Vitals:   01/28/20 0020 01/28/20 0433 01/28/20 0756 01/28/20 0859  BP: 106/77 125/83  131/85  Pulse: 92 78  78  Resp: 19 19  18   Temp: 97.9 F (36.6 C) 97.6 F (36.4 C)  97.9 F (36.6 C)  TempSrc: Oral Oral  Oral  SpO2: 91% 100% 100% 100%  Weight:  52.6 kg    Height:        SpO2: 100 % O2 Flow Rate (L/min): 5 L/min FiO2 (%): 35 %  Wt Readings  from Last 3 Encounters:  01/28/20 52.6 kg  01/13/20 50.8 kg  11/02/19 54.4 kg     Intake/Output Summary (Last 24 hours) at 01/28/2020 1153 Last data filed at 01/28/2020 0500 Gross per 24 hour  Intake 363 ml  Output 450 ml  Net -87 ml    Physical Exam:     Awake Alert, oriented to self, place, context, not to time, normal affect Right hand grip decreased strength (chronic with arthritic changes in bilateral hands from RA) Diffuse crackles in all lung fields, normal respiratory effort on 4-5 L on high flow nasal cannula, no wheezing Left AKA Bruising around mouth,  Regular rate and rhythm, SEM appreciated, no edema Abdomen soft, nondistended, mildly tender in epigastric area, no guarding     I have personally reviewed the following:   Data Reviewed:  CBC Recent Labs  Lab  01/25/20 0311 01/25/20 0311 01/25/20 0342 01/25/20 0624 01/26/20 0427 01/27/20 0436 01/28/20 0523  WBC 5.6  --   --   --  8.5 9.3 11.0*  HGB 12.5   < > 13.3 12.9 12.8 14.2 14.5  HCT 41.9   < > 39.0 38.0 42.4 45.7 47.7*  PLT 177  --   --   --  178 231 200  MCV 97.7  --   --   --  94.9 93.5 93.3  MCH 29.1  --   --   --  28.6 29.0 28.4  MCHC 29.8*  --   --   --  30.2 31.1 30.4  RDW 15.3  --   --   --  15.5 15.8* 15.6*  LYMPHSABS 0.3*  --   --   --   --   --   --   MONOABS 0.1  --   --   --   --   --   --   EOSABS 0.0  --   --   --   --   --   --   BASOSABS 0.0  --   --   --   --   --   --    < > = values in this interval not displayed.    Chemistries  Recent Labs  Lab 01/25/20 0311 01/25/20 0342 01/25/20 0559 01/25/20 0624 01/25/20 1144 01/25/20 1522 01/26/20 0427 01/27/20 0436 01/28/20 0523  NA 136   < >  --  136 137  --  136 139 135  K 6.7*   < >  --  6.1* 4.7  --  5.6* 3.4* 3.8  CL 98  --   --   --  97*  --  97* 90* 92*  CO2 28  --   --   --  29  --  29 38* 34*  GLUCOSE 120*   < > 120*  --  202*  --  129* 124* 92  BUN 42*  --   --   --  40*  --  32* 27* 33*  CREATININE 1.35*  --   --   --  1.28*  --  0.86 0.83 0.90  CALCIUM 9.0  --   --   --  8.9  --  8.8* 8.8* 8.5*  MG  --   --   --   --   --   --  1.7 1.7 2.0  AST 698*  --   --   --   --  733* 411* 271* 141*  ALT 289*  --   --   --   --  345* 311* 286* 225*  ALKPHOS 150*  --   --   --   --  132* 122 101 93  BILITOT 1.2  --   --   --   --  0.9 1.0 0.9 1.2   < > = values in this interval not displayed.   ------------------------------------------------------------------------------------------------------------------ No results for input(s): CHOL, HDL, LDLCALC, TRIG, CHOLHDL, LDLDIRECT in the last 72 hours.  No results found for: HGBA1C ------------------------------------------------------------------------------------------------------------------ Recent Labs    01/26/20 0929  TSH 0.575    ------------------------------------------------------------------------------------------------------------------ No results for input(s): VITAMINB12, FOLATE, FERRITIN, TIBC, IRON, RETICCTPCT in the last 72 hours.  Coagulation profile Recent Labs  Lab 01/25/20 0311  INR 1.5*    No results for input(s): DDIMER in the last 72 hours.  Cardiac Enzymes No results for input(s): CKMB, TROPONINI, MYOGLOBIN in the last 168 hours.  Invalid input(s): CK ------------------------------------------------------------------------------------------------------------------    Component Value Date/Time   BNP 1,062.7 (H) 01/25/2020 1610    Micro Results Recent Results (from the past 240 hour(s))  Culture, blood (Routine x 2)     Status: None (Preliminary result)   Collection Time: 01/25/20  3:00 AM   Specimen: BLOOD  Result Value Ref Range Status   Specimen Description BLOOD RIGHT ARM  Final   Special Requests   Final    BOTTLES DRAWN AEROBIC AND ANAEROBIC Blood Culture results may not be optimal due to an excessive volume of blood received in culture bottles   Culture   Final    NO GROWTH 3 DAYS Performed at [facility], [address], [place], [place] [address]    Report Status PENDING  Incomplete  Culture, blood (Routine x 2)     Status: None (Preliminary result)   Collection Time: 01/25/20  3:10 AM   Specimen: BLOOD  Result Value Ref Range Status   Specimen Description BLOOD RIGHT HAND  Final   Special Requests   Final    BOTTLES DRAWN AEROBIC ONLY Blood Culture adequate volume   Culture   Final    NO GROWTH 3 DAYS Performed at [facility], [address], [place], [place] [address]    Report Status PENDING  Incomplete  SARS Coronavirus 2 by RT PCR (hospital order, performed in [facility] hospital lab) Nasopharyngeal Nasopharyngeal Swab     Status: None   Collection Time: 01/25/20  3:26 AM   Specimen: Nasopharyngeal Swab  Result Value Ref Range Status   SARS  Coronavirus 2 NEGATIVE NEGATIVE Final    Comment: (NOTE) SARS-CoV-2 target nucleic acids are NOT DETECTED.  The SARS-CoV-2 RNA is generally detectable in upper and lower respiratory specimens during the acute phase of infection. The lowest concentration of SARS-CoV-2 viral copies this assay can detect is 250 copies / mL. A negative result does not preclude SARS-CoV-2 infection and should not be used as the sole basis for treatment or other patient management decisions.  A negative result may occur with improper specimen collection / handling, submission of specimen other than nasopharyngeal swab, presence of viral mutation(s) within the areas targeted by this assay, and inadequate number of viral copies (<250 copies / mL). A negative result must be combined with clinical observations, patient history, and epidemiological information.  Fact Sheet for Patients:   BoilerBrush.com.cy  Fact Sheet for Healthcare Providers: https://pope.com/  This test is not yet approved or  cleared by the [place] FDA and has been authorized for detection and/or diagnosis of SARS-CoV-2 by FDA under an Emergency  Use Authorization (EUA).  This EUA will remain in effect (meaning this test can be used) for the duration of the COVID-19 declaration under Section 564(b)(1) of the Act, 21 U.S.C. section 360bbb-3(b)(1), unless the authorization is terminated or revoked sooner.  Performed at [facility], [address] [place], [place] [address]   MRSA PCR Screening     Status: Abnormal   Collection Time: 01/28/20 12:31 AM   Specimen: Nasopharyngeal  Result Value Ref Range Status   MRSA by PCR POSITIVE (A) NEGATIVE Final    Comment:        The GeneXpert MRSA Assay (FDA approved for NASAL specimens only), is one component of a comprehensive MRSA colonization surveillance program. It is not intended to diagnose MRSA infection nor to guide  or monitor treatment for MRSA infections. RESULT CALLED TO, READ BACK BY AND VERIFIED WITH: A ROBERTS RN 01/28/20 0407 JDW Performed at [facility], [address], [place], [place] [address]     Radiology Reports US Abdomen Limited  Result Date: 01/25/2020 CLINICAL DATA:  74 year old female with elevated LFTs. EXAM: ULTRASOUND ABDOMEN LIMITED RIGHT UPPER QUADRANT COMPARISON:  Right upper quadrant ultrasound dated 02/14/2006. FINDINGS: Gallbladder: Cholecystectomy. Common bile duct: Diameter: 4 mm Liver: There is diffuse increased liver echogenicity most commonly seen in the setting of fatty infiltration. Superimposed inflammation or fibrosis is not excluded. Clinical correlation is recommended. Portal vein is patent on color Doppler imaging with normal direction of blood flow towards the liver. Other: There is a partially visualized and incompletely evaluated 2 cm right renal upper pole cyst. IMPRESSION: 1. Cholecystectomy. 2. Fatty liver. 3. Patent main portal vein with hepatopetal flow. Electronically Signed   By: Elgie Collard M.D.   On: 01/25/2020 18:14   DG Chest Port 1 View  Result Date: 01/26/2020 CLINICAL DATA:  Shortness of breath EXAM: PORTABLE CHEST 1 VIEW COMPARISON:  01/25/2020 FINDINGS: Bilateral chronic interstitial thickening. Bilateral emphysematous changes. No focal consolidation. No pleural effusion or pneumothorax. Heart and mediastinal contours are unremarkable. No acute osseous abnormality. IMPRESSION: No active disease. Electronically Signed   By: Elige Ko   On: 01/26/2020 16:23   DG Chest Portable 1 View  Result Date: 01/25/2020 CLINICAL DATA:  Shortness of breath. EXAM: PORTABLE CHEST 1 VIEW COMPARISON:  10/13/2019, CT 08/07/2018 FINDINGS: Mild cardiomegaly. Unchanged mediastinal contours. Emphysema with interstitial coarsening. Bibasilar reticulation consistent with honeycombing. No acute or confluent airspace disease. No pleural effusion or pneumothorax.  Bones are under mineralized. No acute osseous abnormalities are seen. IMPRESSION: 1. Cardiomegaly. 2. Emphysema.  Basilar honeycombing/fibrosis. Electronically Signed   By: Narda Rutherford M.D.   On: 01/25/2020 03:10   ECHOCARDIOGRAM COMPLETE  Result Date: 01/26/2020    ECHOCARDIOGRAM REPORT   Patient Name:   EDMONIA GONSER Porche Date of Exam: 01/26/2020 Medical Rec #:  [id number]          Height:       66.0 in Accession #:    [id number]         Weight:       112.0 lb Date of Birth:  February 09, 1946          BSA:          1.563 m Patient Age:    73 years           BP:           122/69 mmHg Patient Gender: F  HR:           105 bpm. Exam Location:  Inpatient Procedure: 2D Echo Indications:    cardiomyopathy  History:        Patient has prior history of Echocardiogram examinations, most                 recent 10/07/2018. COPD; Risk Factors:Hypertension and                 Dyslipidemia.  Sonographer:    Celene Skeen RDCS (AE) Referring Phys: [id number] [facility] A SMITH  Sonographer Comments: restricted mobility - patient unable to turn on left side. additionally, head of the patient unable to lower significantly, which made imaging challenging IMPRESSIONS  1. Marded right sided heart failure.  2. Left ventricular ejection fraction, by estimation, is 45 to 50%. The left ventricle has mildly decreased function. The left ventricle demonstrates global hypokinesis. Left ventricular diastolic parameters were normal.  3. Right ventricular systolic function is severely reduced. The right ventricular size is severely enlarged. There is severely elevated pulmonary artery systolic pressure.  4. Left atrial size was mildly dilated.  5. Right atrial size was severely dilated.  6. The mitral valve is abnormal. Mild mitral valve regurgitation. No evidence of mitral stenosis.  7. Tricuspid valve regurgitation is severe.  8. The aortic valve is tricuspid. Aortic valve regurgitation is mild. Mild to moderate aortic valve  sclerosis/calcification is present, without any evidence of aortic stenosis.  9. The inferior vena cava is normal in size with greater than 50% respiratory variability, suggesting right atrial pressure of 3 mmHg. FINDINGS  Left Ventricle: Left ventricular ejection fraction, by estimation, is 45 to 50%. The left ventricle has mildly decreased function. The left ventricle demonstrates global hypokinesis. The left ventricular internal cavity size was normal in size. There is  no left ventricular hypertrophy. Left ventricular diastolic parameters were normal. Right Ventricle: The right ventricular size is severely enlarged. Right vetricular wall thickness was not assessed. Right ventricular systolic function is severely reduced. There is severely elevated pulmonary artery systolic pressure. The tricuspid regurgitant velocity is 3.91 m/s, and with an assumed right atrial pressure of 10 mmHg, the estimated right ventricular systolic pressure is 71.2 mmHg. Left Atrium: Left atrial size was mildly dilated. Right Atrium: Right atrial size was severely dilated. Pericardium: There is no evidence of pericardial effusion. Mitral Valve: The mitral valve is abnormal. There is moderate thickening of the mitral valve leaflet(s). There is moderate calcification of the mitral valve leaflet(s). Normal mobility of the mitral valve leaflets. Moderate mitral annular calcification. Mild mitral valve regurgitation. No evidence of mitral valve stenosis. Tricuspid Valve: The tricuspid valve is normal in structure. Tricuspid valve regurgitation is severe. No evidence of tricuspid stenosis. Aortic Valve: The aortic valve is tricuspid. Aortic valve regurgitation is mild. Mild to moderate aortic valve sclerosis/calcification is present, without any evidence of aortic stenosis. Pulmonic Valve: The pulmonic valve was normal in structure. Pulmonic valve regurgitation is not visualized. No evidence of pulmonic stenosis. Aorta: The aortic root is  normal in size and structure. Venous: The inferior vena cava is normal in size with greater than 50% respiratory variability, suggesting right atrial pressure of 3 mmHg. IAS/Shunts: No atrial level shunt detected by color flow Doppler. Additional Comments: Marded right sided heart failure.  LEFT VENTRICLE PLAX 2D LVIDd:         4.20 cm LVIDs:         2.80 cm LV PW:  1.40 cm LV IVS:        1.00 cm LVOT diam:     2.00 cm LV SV:         35 LV SV Index:   23 LVOT Area:     3.14 cm  LEFT ATRIUM         Index      RIGHT ATRIUM           Index LA diam:    4.00 cm 2.56 cm/m RA Area:     25.90 cm                                RA Volume:   96.80 ml  61.93 ml/m  AORTIC VALVE LVOT Vmax:   73.10 cm/s LVOT Vmean:  43.500 cm/s LVOT VTI:    0.113 m TRICUSPID VALVE TR Peak grad:   61.2 mmHg TR Vmax:        391.00 cm/s  SHUNTS Systemic VTI:  0.11 m Systemic Diam: 2.00 cm Charlton Haws MD Electronically signed by Charlton Haws MD Signature Date/Time: 01/26/2020/10:51:37 AM    Final    DG ESOPHAGUS W SINGLE CM (SOL OR THIN BA)  Result Date: 01/27/2020 CLINICAL DATA:  74 year old female inpatient with COPD and dementia with reported dysphagia and failure to thrive. EXAM: ESOPHOGRAM/BARIUM SWALLOW TECHNIQUE: Single contrast examination was performed using  thin barium. FLUOROSCOPY TIME:  Fluoroscopy Time:  1 minutes 18 seconds Number of Acquired Spot Images: 1 COMPARISON:  08/07/2026 chest CT. FINDINGS: Examination significantly limited by patient mental status and mobility limitations. Examination performed with the patient in the left lateral decubitus position with slight head elevation. No laryngeal penetration or tracheobronchial aspiration observed. Small to moderate hiatal hernia. Unable to assess for gastroesophageal reflux. No gross esophageal mass or ulcer. A small traction diverticulum is noted along the right midthoracic esophagus. There is a suggestion of a short segment of mild smooth narrowing in the lower  thoracic esophagus at the esophagogastric junction. Otherwise normal esophageal distensibility. Suggestion of moderate esophageal dysmotility with proximal escape and tertiary contractions. Barium tablet not administered due to above patient limitations. IMPRESSION: 1. Significantly limited study, see comments. 2. Small to moderate hiatal hernia. 3. Suggestion of a short segment of mild smooth narrowing in the lower thoracic esophagus at the esophagogastric junction, cannot exclude a mild peptic stricture, see comments. No gross esophageal mass. 4. 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 5. Small traction diverticulum along the right mid thoracic esophagus. 6. No laryngeal penetration or tracheobronchial aspiration observed. Electronically Signed   By: Delbert Phenix M.D.   On: 01/27/2020 11:36     Time Spent in minutes  30     Laverna Peace M.D on 01/28/2020 at 11:53 AM  To page go to [email or web address] - password [facility]

## 2020-01-28 NOTE — [place] FL2 (Signed)
[place] MEDICAID FL2 LEVEL OF CARE SCREENING TOOL     IDENTIFICATION  Patient Name: Whitney Steele Birthdate: Oct 31, 1945 Sex: female Admission Date (Current Location): 01/25/2020  [place] and [place] Number:  Haynes Bast [id number] Facility and Address:  [facility], [address] [place], [place] [address]      Provider Number: [id number]  Attending Physician Name and Address:  Laverna Peace, MD  Relative Name and Phone Number:       Current Level of Care: Hospital Recommended Level of Care: Skilled Nursing Facility Prior Approval Number:    Date Approved/Denied:   PASRR Number:    Discharge Plan: SNF    Current Diagnoses: Patient Active Problem List   Diagnosis Date Noted  . Dysphagia   . LFT elevation   . Hypoxia   . Goals of care, counseling/discussion   . Palliative care by specialist   . Acute respiratory failure with hypoxia and hypercapnia (HCC) 01/25/2020  . AKI (acute kidney injury) (HCC) 01/25/2020  . Transaminitis 01/25/2020  . Hyperkalemia 01/25/2020  . DNR (do not resuscitate) 01/25/2020  . Elevated brain natriuretic peptide (BNP) level 01/25/2020  . History of rheumatoid arthritis 01/25/2020  . Pulmonary fibrosis (HCC) 01/25/2020  . COPD (chronic obstructive pulmonary disease) (HCC) 01/25/2020  . Confusion   . Unilateral AKA, left (HCC)   . Sacral ulcer (HCC) 10/06/2018  . Cellulitis of left lower extremity   . Altered mental status 10/05/2018    Orientation RESPIRATION BLADDER Height & Weight     Self  O2 External catheter Weight: 116 lb (52.6 kg) Height:  5\' 6"  (167.6 cm)  BEHAVIORAL SYMPTOMS/MOOD NEUROLOGICAL BOWEL NUTRITION STATUS      Continent    AMBULATORY STATUS COMMUNICATION OF NEEDS Skin   Limited Assist Verbally PU Stage and Appropriate Care (unstageable PU coccyx)                       Personal Care Assistance Level of Assistance  Feeding, Dressing   Feeding assistance: Limited  assistance Dressing Assistance: Limited assistance     Functional Limitations Info  Sight Sight Info: Impaired        SPECIAL CARE FACTORS FREQUENCY                       Contractures      Additional Factors Info  Code Status Code Status Info: DNR             Current Medications (01/28/2020):  This is the current hospital active medication list Current Facility-Administered Medications  Medication Dose Route Frequency Provider Last Rate Last Admin  . 0.9 %  sodium chloride infusion   Intravenous PRN 01/30/2020 D, MD 10 mL/hr at 01/27/20 0914 250 mL at 01/27/20 0914  . albuterol (PROVENTIL) (2.5 MG/3ML) 0.083% nebulizer solution 2.5 mg  2.5 mg Nebulization Q4H PRN 01/29/20 A, MD      . ALPRAZolam Madelyn Flavors) tablet 0.25 mg  0.25 mg Oral BID Prudy Feeler, Rondell A, MD   0.25 mg at 01/28/20 0908  . aspirin EC tablet 81 mg  81 mg Oral Daily 01/30/20 A, MD   81 mg at 01/28/20 0909  . Chlorhexidine Gluconate Cloth 2 % PADS 6 each  6 each Topical Q0600 01/30/20, MD   6 each at 01/28/20 [phone]  . digoxin (LANOXIN) tablet 0.125 mg  0.125 mg Oral Q M,W,F 5784, MD   0.125 mg at  01/28/20 0909  . diltiazem (CARDIZEM) tablet 30 mg  30 mg Oral TID Madelyn Flavors A, MD   30 mg at 01/28/20 0908  . docusate sodium (COLACE) capsule 100 mg  100 mg Oral BID Madelyn Flavors A, MD   100 mg at 01/28/20 0909  . DULoxetine (CYMBALTA) DR capsule 60 mg  60 mg Oral Daily Madelyn Flavors A, MD   60 mg at 01/28/20 0909  . enoxaparin (LOVENOX) injection 40 mg  40 mg Subcutaneous Q24H Smith, Rondell A, MD   40 mg at 01/27/20 1258  . ferrous sulfate tablet 325 mg  325 mg Oral Q breakfast Madelyn Flavors A, MD   325 mg at 01/28/20 0909  . folic acid (FOLVITE) tablet 1 mg  1 mg Oral Daily Katrinka Blazing, Rondell A, MD   1 mg at 01/28/20 0909  . [START ON 01/31/2020] furosemide (LASIX) tablet 20 mg  20 mg Oral Q Mon Kadakia, Viviano Simas, MD      . gabapentin (NEURONTIN) capsule 100 mg  100 mg Oral BID  Madelyn Flavors A, MD   100 mg at 01/28/20 0910  . levETIRAcetam (KEPPRA) tablet 500 mg  500 mg Oral BID Madelyn Flavors A, MD   500 mg at 01/28/20 0910  . lip balm (CARMEX) ointment   Topical PRN Laverna Peace, MD   1 application at 01/28/20 [phone]  . LORazepam (ATIVAN) injection 0.5 mg  0.5 mg Intravenous Q4H PRN Madelyn Flavors A, MD   0.5 mg at 01/25/20 1548  . morphine 2 MG/ML injection 2 mg  2 mg Intravenous Q4H PRN Madelyn Flavors A, MD   2 mg at 01/27/20 1755  . mupirocin ointment (BACTROBAN) 2 % 1 application  1 application Nasal BID Laverna Peace, MD   1 application at 01/28/20 [phone]  . ondansetron (ZOFRAN) tablet 4 mg  4 mg Oral Q6H PRN Madelyn Flavors A, MD       Or  . ondansetron (ZOFRAN) injection 4 mg  4 mg Intravenous Q6H PRN Smith, Rondell A, MD      . oxyCODONE (Oxy IR/ROXICODONE) immediate release tablet 5 mg  5 mg Oral Q8H PRN Smith, Rondell A, MD      . pantoprazole (PROTONIX) EC tablet 40 mg  40 mg Oral BID Madelyn Flavors A, MD   40 mg at 01/28/20 0910  . polyethylene glycol (MIRALAX / GLYCOLAX) packet 17 g  17 g Oral Daily Madelyn Flavors A, MD   17 g at 01/28/20 0904  . [START ON 01/31/2020] potassium chloride (KLOR-CON) CR tablet 10 mEq  10 mEq Oral Q Tenny Craw, Viviano Simas, MD      . predniSONE (DELTASONE) tablet 40 mg  40 mg Oral Q breakfast Lorin Glass, MD   40 mg at 01/28/20 0910  . sodium chloride flush (NS) 0.9 % injection 3 mL  3 mL Intravenous Q12H Smith, Rondell A, MD   3 mL at 01/28/20 0911  . thiamine tablet 100 mg  100 mg Oral Daily Madelyn Flavors A, MD   100 mg at 01/28/20 0911     Discharge Medications: Please see discharge summary for a list of discharge medications.  Relevant Imaging Results:  Relevant Lab Results:   Additional Information SS# [id number]  Deatra Robinson, [place]

## 2020-01-28 NOTE — TOC Initial Note (Signed)
Transition of Care [facility]) - Initial/Assessment Note    Patient Details  Name: Whitney Steele MRN: [id number] Date of Birth: 1945-08-05  Transition of Care [facility]) CM/SW Contact:    Deatra Robinson, [place] Phone Number: 01/28/2020, 10:27 AM  Clinical Narrative:  Pt admitted from [facility] SNF. Spoke to dtr who confirms pt is a LTC/SNF resident and the plan is for her to return to [facility] at dc. Dtr has made recent efforts to have pt placed at an ALF in [place], but reports no beds available at this time and is agreeable with return to SNF. Tammy with [facility] updated and confirmed pt able to return at dc. SW will follow.   Dellie Burns, MSW, LCSW [phone] (coverage)                 Expected Discharge Plan: Skilled Nursing Facility Barriers to Discharge: Continued Medical Work up   Patient Goals and CMS Choice        Expected Discharge Plan and Services Expected Discharge Plan: Skilled Nursing Facility     Post Acute Care Choice: Skilled Nursing Facility Living arrangements for the past 2 months: Skilled Nursing Facility                                      Prior Living Arrangements/Services Living arrangements for the past 2 months: Skilled Nursing Facility Lives with:: Facility Resident Patient language and need for interpreter reviewed:: No        Need for Family Participation in Patient Care: Yes (Comment)     Criminal Activity/Legal Involvement Pertinent to Current Situation/Hospitalization: No - Comment as needed  Activities of Daily Living      Permission Sought/Granted Permission sought to share information with : Oceanographer granted to share information with : Yes, Verbal Permission Granted              Emotional Assessment       Orientation: : Oriented to Self, Fluctuating Orientation (Suspected and/or reported Sundowners)      Admission diagnosis:  Respiration abnormal  [R06.9] Hypoxia [R09.02] LFT elevation [R79.89] Dysphagia [R13.10] Acute respiratory failure with hypoxia and hypercapnia (HCC) [J96.01, J96.02] Patient Active Problem List   Diagnosis Date Noted  . Dysphagia   . LFT elevation   . Hypoxia   . Goals of care, counseling/discussion   . Palliative care by specialist   . Acute respiratory failure with hypoxia and hypercapnia (HCC) 01/25/2020  . AKI (acute kidney injury) (HCC) 01/25/2020  . Transaminitis 01/25/2020  . Hyperkalemia 01/25/2020  . DNR (do not resuscitate) 01/25/2020  . Elevated brain natriuretic peptide (BNP) level 01/25/2020  . History of rheumatoid arthritis 01/25/2020  . Pulmonary fibrosis (HCC) 01/25/2020  . COPD (chronic obstructive pulmonary disease) (HCC) 01/25/2020  . Confusion   . Unilateral AKA, left (HCC)   . Sacral ulcer (HCC) 10/06/2018  . Cellulitis of left lower extremity   . Altered mental status 10/05/2018   PCP:  Blane Ohara, MD Pharmacy:   [facility] [place], [place] - [address] [place] [address] Phone: [phone] Fax: [phone]     Social Determinants of Health (SDOH) Interventions    Readmission Risk Interventions Readmission Risk Prevention Plan 10/12/2018  Transportation Screening Complete  PCP or Specialist Appt within 5-7 Days Complete  Home Care Screening Complete  Medication Review (RN CM) Complete  Some recent data might be hidden

## 2020-01-28 NOTE — Progress Notes (Signed)
Pt refused bipap. RT will continue to monitor.

## 2020-01-28 NOTE — Anesthesia Postprocedure Evaluation (Signed)
Anesthesia Post Note  Patient: Whitney Steele  Procedure(s) Performed: ESOPHAGOGASTRODUODENOSCOPY (EGD) WITH PROPOFOL (N/A ) ESOPHAGEAL DILATION     Patient location during evaluation: PACU Anesthesia Type: MAC Level of consciousness: awake and alert and oriented Pain management: pain level controlled Vital Signs Assessment: post-procedure vital signs reviewed and stable Respiratory status: spontaneous breathing, nonlabored ventilation and respiratory function stable Cardiovascular status: stable and blood pressure returned to baseline Postop Assessment: no apparent nausea or vomiting Anesthetic complications: no   No complications documented.  Last Vitals:  Vitals:   01/28/20 1433 01/28/20 1453  BP: 124/71 131/83  Pulse: 82   Resp: 14 16  Temp:  36.7 C  SpO2: 100% 100%    Last Pain:  Vitals:   01/28/20 1453  TempSrc: Oral  PainSc:                  Hawkin Charo A.

## 2020-01-28 NOTE — Transfer of Care (Signed)
Immediate Anesthesia Transfer of Care Note  Patient: Whitney Steele  Procedure(s) Performed: ESOPHAGOGASTRODUODENOSCOPY (EGD) WITH PROPOFOL (N/A ) ESOPHAGEAL DILATION  Patient Location: Endoscopy Unit  Anesthesia Type:MAC  Level of Consciousness: drowsy and patient cooperative  Airway & Oxygen Therapy: Patient Spontanous Breathing and Patient connected to nasal cannula oxygen  Post-op Assessment: Report given to RN, Post -op Vital signs reviewed and stable and Patient moving all extremities X 4  Post vital signs: Reviewed and stable  Last Vitals:  Vitals Value Taken Time  BP 106/59 01/28/20 1413  Temp    Pulse 89 01/28/20 1414  Resp 16 01/28/20 1414  SpO2 94 % 01/28/20 1414  Vitals shown include unvalidated device data.  Last Pain:  Vitals:   01/28/20 1242  TempSrc: Oral  PainSc:          Complications: No complications documented.

## 2020-01-28 NOTE — Op Note (Signed)
[facility] Patient Name: Whitney Steele Procedure Date : 01/28/2020 MRN: [id number] Attending MD: Beverley Fiedler , MD Date of Birth: 08-03-1945 CSN: [id number] Age: 74 Admit Type: Inpatient Procedure:                Upper GI endoscopy Indications:              Dysphagia, Abnormal cine-esophagram, known                            esophageal stricture s/p dilation in 2018 with Dr.                            Chales Abrahams, known esophageal dysmotility Providers:                Carie Caddy. Rhea Belton, MD, [place] RN, RN, Michele Mcalpine Technician, Lawson Radar, Technician,                            Bettey Mare. Print production planner, CRNA Referring MD:             [facility] Medicines:                Monitored Anesthesia Care Complications:            No immediate complications. Estimated Blood Loss:     Estimated blood loss was minimal. Procedure:                Pre-Anesthesia Assessment:                           - Prior to the procedure, a History and Physical                            was performed, and patient medications and                            allergies were reviewed. The patient's tolerance of                            previous anesthesia was also reviewed. The risks                            and benefits of the procedure and the sedation                            options and risks were discussed with the patient.                            All questions were answered, and informed consent                            was obtained. Prior Anticoagulants: The patient has  taken no previous anticoagulant or antiplatelet                            agents. ASA Grade Assessment: III - A patient with                            severe systemic disease. After reviewing the risks                            and benefits, the patient was deemed in                            satisfactory condition to undergo the procedure.                            After obtaining informed consent, the endoscope was                            passed under direct vision. Throughout the                            procedure, the patient's blood pressure, pulse, and                            oxygen saturations were monitored continuously. The                            [id number]-H190 ([id number]) Olympus gastroscope was                            introduced through the mouth, and advanced to the                            second part of duodenum. The upper GI endoscopy was                            accomplished without difficulty. The patient                            tolerated the procedure well. Scope In: Scope Out: Findings:      The mid esophagus and distal esophagus were moderately tortuous.      A low-grade of narrowing Schatzki ring was found at the gastroesophageal       junction. A TTS dilator was passed through the scope. Dilation with a       15-16.5-18 mm balloon dilator was performed to 16.5 mm. The dilation       site was examined and showed mild mucosal disruption and mild       improvement in luminal narrowing.      A large hiatal hernia was found. The proximal extent of the gastric       folds (end of tubular esophagus) was 36 cm from the incisors. The hiatal       narrowing was 44 cm from the incisors.      No gross lesions were noted in the  entire examined stomach.      The examined duodenum was normal. Impression:               - Tortuous esophagus.                           - Low-grade of narrowing Schatzki ring. Dilated.                           - Large hiatal hernia.                           - No gross lesions in the stomach.                           - Normal examined duodenum.                           - No specimens collected. Moderate Sedation:      N/A Recommendation:           - Return patient to hospital ward for ongoing care.                           - Advance diet as tolerated.                            - Continue present medications including PPI to                            control reflux in the setting of large hiatal                            hernia and esophageal dysmotility.                           - GI will sign off, call if questions. Procedure Code(s):        --- Professional ---                           [phone], Esophagogastroduodenoscopy, flexible,                            transoral; with transendoscopic balloon dilation of                            esophagus (less than 30 mm diameter) Diagnosis Code(s):        --- Professional ---                           Q39.9, Congenital malformation of esophagus,                            unspecified                           K22.2, Esophageal obstruction  K44.9, Diaphragmatic hernia without obstruction or                            gangrene                           R13.10, Dysphagia, unspecified                           R93.3, Abnormal findings on diagnostic imaging of                            other parts of digestive tract CPT copyright 2019 American Medical Association. All rights reserved. The codes documented in this report are preliminary and upon coder review may  be revised to meet current compliance requirements. Beverley Fiedler, MD 01/28/2020 2:22:33 PM This report has been signed electronically. Number of Addenda: 0

## 2020-01-28 NOTE — Consult Note (Signed)
Ref: Blane Ohara, MD   Subjective:  Awake. No chest pain. VS are stable. O2 sat 100 %.  Objective:  Vital Signs in the last 24 hours: Temp:  [97.6 F (36.4 C)-98.5 F (36.9 C)] 97.9 F (36.6 C) (08/20 0859) Pulse Rate:  [78-95] 78 (08/20 0859) Cardiac Rhythm: Sinus tachycardia (08/19 1913) Resp:  [18-19] 18 (08/20 0859) BP: (103-131)/(73-85) 131/85 (08/20 0859) SpO2:  [91 %-100 %] 100 % (08/20 0859) Weight:  [52.6 kg] 52.6 kg (08/20 0433)  Physical Exam: BP Readings from Last 1 Encounters:  01/28/20 131/85     Wt Readings from Last 1 Encounters:  01/28/20 52.6 kg    Weight change: 0 kg Body mass index is 18.72 kg/m. HEENT: [place]/AT, Eyes-Blue, PERL, EOMI, Conjunctiva-Pink, Sclera-Non-icteric Neck: No JVD, No bruit, Trachea midline. Lungs:  Clearing with fine crackles, Bilateral. Cardiac:  Regular rhythm, normal S1 and S2, no S3. II/VI systolic murmur. Abdomen:  Soft, non-tender. BS present. Extremities:  No edema present. No cyanosis. No clubbing. Left AKA. CNS: AxOx3, Cranial nerves grossly intact, moves all 4 extremities.  Skin: Warm and dry.   Intake/Output from previous day: 08/19 0701 - 08/20 0700 In: 363 [P.O.:360; I.V.:3] Out: 800 [Urine:800]    Lab Results: BMET    Component Value Date/Time   NA 135 01/28/2020 0523   NA 139 01/27/2020 0436   NA 136 01/26/2020 0427   K 3.8 01/28/2020 0523   K 3.4 (L) 01/27/2020 0436   K 5.6 (H) 01/26/2020 0427   CL 92 (L) 01/28/2020 0523   CL 90 (L) 01/27/2020 0436   CL 97 (L) 01/26/2020 0427   CO2 34 (H) 01/28/2020 0523   CO2 38 (H) 01/27/2020 0436   CO2 29 01/26/2020 0427   GLUCOSE 92 01/28/2020 0523   GLUCOSE 124 (H) 01/27/2020 0436   GLUCOSE 129 (H) 01/26/2020 0427   BUN 33 (H) 01/28/2020 0523   BUN 27 (H) 01/27/2020 0436   BUN 32 (H) 01/26/2020 0427   CREATININE 0.90 01/28/2020 0523   CREATININE 0.83 01/27/2020 0436   CREATININE 0.86 01/26/2020 0427   CALCIUM 8.5 (L) 01/28/2020 0523   CALCIUM 8.8 (L)  01/27/2020 0436   CALCIUM 8.8 (L) 01/26/2020 0427   GFRNONAA >60 01/28/2020 0523   GFRNONAA >60 01/27/2020 0436   GFRNONAA >60 01/26/2020 0427   GFRAA >60 01/28/2020 0523   GFRAA >60 01/27/2020 0436   GFRAA >60 01/26/2020 0427   CBC    Component Value Date/Time   WBC 11.0 (H) 01/28/2020 0523   RBC 5.11 01/28/2020 0523   HGB 14.5 01/28/2020 0523   HCT 47.7 (H) 01/28/2020 0523   PLT 200 01/28/2020 0523   MCV 93.3 01/28/2020 0523   MCH 28.4 01/28/2020 0523   MCHC 30.4 01/28/2020 0523   RDW 15.6 (H) 01/28/2020 0523   LYMPHSABS 0.3 (L) 01/25/2020 0311   MONOABS 0.1 01/25/2020 0311   EOSABS 0.0 01/25/2020 0311   BASOSABS 0.0 01/25/2020 0311   HEPATIC Function Panel Recent Labs    01/26/20 0427 01/27/20 0436 01/28/20 0523  PROT 7.3 7.2 6.8   HEMOGLOBIN A1C No components found for: HGA1C,  MPG CARDIAC ENZYMES Lab Results  Component Value Date   CKTOTAL 27 (L) 01/25/2020   BNP No results for input(s): PROBNP in the last 8760 hours. TSH Recent Labs    01/26/20 0929  TSH 0.575   CHOLESTEROL No results for input(s): CHOL in the last 8760 hours.  Scheduled Meds: . ALPRAZolam  0.25 mg Oral BID  .  aspirin EC  81 mg Oral Daily  . Chlorhexidine Gluconate Cloth  6 each Topical Q0600  . digoxin  0.125 mg Oral Q M,W,F  . diltiazem  30 mg Oral TID  . docusate sodium  100 mg Oral BID  . DULoxetine  60 mg Oral Daily  . enoxaparin (LOVENOX) injection  40 mg Subcutaneous Q24H  . ferrous sulfate  325 mg Oral Q breakfast  . folic acid  1 mg Oral Daily  . [START ON 01/31/2020] furosemide  20 mg Oral Q Mon  . gabapentin  100 mg Oral BID  . levETIRAcetam  500 mg Oral BID  . mupirocin ointment  1 application Nasal BID  . pantoprazole  40 mg Oral BID  . polyethylene glycol  17 g Oral Daily  . [START ON 01/31/2020] potassium chloride  10 mEq Oral Q Mon  . predniSONE  40 mg Oral Q breakfast  . sodium chloride flush  3 mL Intravenous Q12H  . thiamine  100 mg Oral Daily    Continuous Infusions: . sodium chloride 250 mL (01/27/20 0914)   PRN Meds:.sodium chloride, albuterol, lip balm, LORazepam, morphine injection, ondansetron **OR** ondansetron (ZOFRAN) IV, oxyCODONE  Assessment/Plan: Acute on chronic respiratory failure with hypoxia and hypercapnea COPD with pulmonary fibrosis Severe pulmonary hypertension Acute systolic left heart failure Abnormal troponin from heart failure Abnormal livr enzymes from liver congestion Hyperkalemia resolved Rheumatoid arthritis Left AKA PVD Vascular dementia Hyperlipidemia  Continue medical treatment. F/U in 2 weeks.    LOS: 3 days   Time spent including chart review, lab review, examination, discussion with patient : 30 min   Orpah Cobb  MD  01/28/2020, 10:24 AM

## 2020-01-29 DIAGNOSIS — I272 Pulmonary hypertension, unspecified: Secondary | ICD-10-CM

## 2020-01-29 LAB — COMPREHENSIVE METABOLIC PANEL
ALT: 138 U/L — ABNORMAL HIGH (ref 0–44)
AST: 51 U/L — ABNORMAL HIGH (ref 15–41)
Albumin: 2.4 g/dL — ABNORMAL LOW (ref 3.5–5.0)
Alkaline Phosphatase: 79 U/L (ref 38–126)
Anion gap: 8 (ref 5–15)
BUN: 30 mg/dL — ABNORMAL HIGH (ref 8–23)
CO2: 37 mmol/L — ABNORMAL HIGH (ref 22–32)
Calcium: 8.4 mg/dL — ABNORMAL LOW (ref 8.9–10.3)
Chloride: 93 mmol/L — ABNORMAL LOW (ref 98–111)
Creatinine, Ser: 0.75 mg/dL (ref 0.44–1.00)
GFR calc Af Amer: >60 mL/min (ref >60)
GFR calc non Af Amer: >60 mL/min (ref >60)
Glucose, Bld: 96 mg/dL (ref 70–99)
Potassium: 3.9 mmol/L (ref 3.5–5.1)
Sodium: 138 mmol/L (ref 135–145)
Total Bilirubin: 1.1 mg/dL (ref 0.3–1.2)
Total Protein: 6.4 g/dL — ABNORMAL LOW (ref 6.5–8.1)

## 2020-01-29 LAB — CBC
HCT: 47 % — ABNORMAL HIGH (ref 36.0–46.0)
Hemoglobin: 14.3 g/dL (ref 12.0–15.0)
MCH: 28.6 pg (ref 26.0–34.0)
MCHC: 30.4 g/dL (ref 30.0–36.0)
MCV: 94 fL (ref 80.0–100.0)
Platelets: 167 10*3/uL (ref 150–400)
RBC: 5 MIL/uL (ref 3.87–5.11)
RDW: 15.8 % — ABNORMAL HIGH (ref 11.5–15.5)
WBC: 10.5 10*3/uL (ref 4.0–10.5)
nRBC: 0 % (ref 0.0–0.2)

## 2020-01-29 LAB — GLUCOSE, CAPILLARY
Glucose-Capillary: 169 mg/dL — ABNORMAL HIGH (ref 70–99)
Glucose-Capillary: 148 mg/dL — ABNORMAL HIGH (ref 70–99)
Glucose-Capillary: 166 mg/dL — ABNORMAL HIGH (ref 70–99)

## 2020-01-29 LAB — MAGNESIUM: Magnesium: 1.9 mg/dL (ref 1.7–2.4)

## 2020-01-29 LAB — PHOSPHORUS: Phosphorus: 2.7 mg/dL (ref 2.5–4.6)

## 2020-01-29 NOTE — Progress Notes (Signed)
[facility]  PROGRESS NOTE  Whitney Steele DOB: Mar 28, 1946 DOA: 01/25/2020 PCP: Blane Ohara, MD Admit date - 01/25/2020   Admitting Physician Clydie Braun, MD  Outpatient Primary MD for the patient is [name], [name], MD  LOS - 4 Brief Narrative   Whitney Steele is a 74 y.o. year old female with medical history significant for chronic hypoxic respiratory failure on 3 L secondary to COPD/pulmonary fibrosis, RA, hypothyroidism, vascular dementia, HTN, HLD, dysphagia secondary to history of esophageal stricture who presented on 01/25/2020 with worsening shortness of breath at her facility and found to have O2 saturations in the mid 70s per EMS requiring nonrebreather to improve sats to 90%  Hospital course complicated requiring BiPAP after initial ABG revealed pH of 7.25 and PCO2 of 73 with PO2 of 84 in the ED.  Also noted to have elevated AST of 698, ALT of 289, BNP greater than 1000 with chest x-ray showing cardiomegaly emphysema and pulmonary fibrosis.  PCCM was consulted on admission.  Patient is able to transition down to nasal cannula without recurrent need of BiPAP.  Underwent esophagram which showed concern for potential peptic stricture and presbyesophagus.  Additionally TTE showed RVSP greater than 70 prompting cardiology consultation for further assistance.  Barium esophagram consistent with presbyesophagus as well as possible area of stricture.  Subjective  Underwent EGD with dilation with GI on 8/20. Eating well this . No complaints  A & P    Acute on chronic hypoxic/hypercapnic respiratory failure presumed secondary to combined COPD/CHF exacerbation, improving.  Baseline O2 of 3 L and currently 4 L [place], however patient is maintaining oxygen saturation of 100%, looks comfortable respiratory status, no wheezing on exam. Has not required BiPAP since admission day. Exacerbation likely being driven by chronic emphysema and pulmonary fibrosis and further  complicated by severe pulmonary hypertension.   Covid test negative on admission -Appreciate PCCM recommendations, continue oral  duo nebs 3 times daily -BiPAP nightly and as needed,  monitor in progressive unit -Encourage incentive spirometer, flutter valve -Goal SPO2 greater than 88%, continue to wean O2 as able  Severely reduced systolic function with RVSP of 71.  Suspect related to chronic pulmonary hypertension in setting of known chronic emphysema/pulmonary fibrosis.  Did present with elevated BNP and cardiomegaly but clinically no overt signs of volume overload. -Cardiology recommends weekly dosing of Lasix, also added low-dose digoxin  -Daily weights, strict intake/output   Elevated AST/ALT, improving.  On admission AST peaked of a 733 ALT of 345, currently downtrending.  Suspect related to hepatic congestion from right-sided heart failure.  Hepatitis panel unremarkable, right upper quadrant ultrasound showed fatty liver -monitor CMP  Upper abdominal pain, resolved.    LFTs continue to downtrend, right upper quadrant ultrasound shows no acute pathology.  Having BMs. 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 S/pg EGD and dilation by GI on 8/20. Also found to have large hiatal hernia.   -PPI for hiatal hernia --tolerating soft diet  AKI with hyperkalemia, resolved.  Peak creatinine of 1.3 on admission potassium 6.1, improved with Lokelma and IV fluids, suspect prerenal -Monitor BMP, avoid nephrotoxins, monitor output  Unwitnessed fall from bed.  No fractures on imaging.  CK unremarkable.  Nursing facility did not report any fall.  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  Alert and oriented x to person, place,  intermittently to time following commands -Delirium precautions  -continue home Cymbalta, Xanax  HLD -Discontinued home Lipitor given elevated LFTs per cardiology  Pain -Continue home Neurontin for   Seizure history -Continue home Keppra -Seizure precautions   Family Communication  : None, will update daughter [name]  Code Status : DNR, discussed on day of admission  Disposition Plan  :  Patient is from skilled nursing facility. Anticipated d/c date: 2 to 3 days. Barriers to d/c or necessity for inpatient status:  Continued need to monitor respiratory status--weaning nasal cannula, c, close monitoring of right-sided heart failure,  Consults  : GI, PCCM, cardiology  Procedures  :   TTE, 8/18;   Barium esophagram 8/19  EGD 8/20 Tortuous esophagus. - Low-grade of narrowing Schatzki ring. Dilated. - Large hiatal hernia. - No gross lesions in the stomach. - Normal examined duodenum. - No specimens collected.  DVT Prophylaxis  :  Lovenox   Lab Results  Component Value Date   PLT 167 01/29/2020    Diet :  Diet Order            DIET SOFT Room service appropriate? Yes; Fluid consistency: Thin  Diet effective now                  Inpatient Medications Scheduled Meds: . ALPRAZolam  0.25 mg Oral BID  . aspirin EC  81 mg Oral Daily  . Chlorhexidine Gluconate Cloth  6 each Topical Q0600  . digoxin  0.125 mg Oral Q M,W,F  . diltiazem  30 mg Oral TID  . docusate sodium  100 mg Oral BID  . DULoxetine  60 mg Oral Daily  . enoxaparin (LOVENOX) injection  40 mg Subcutaneous Q24H  . ferrous sulfate  325 mg Oral Q breakfast  . folic acid  1 mg Oral Daily  . [START ON 01/31/2020] furosemide  20 mg Oral Q Mon  . gabapentin  100 mg Oral BID  . levETIRAcetam  500 mg Oral BID  . mupirocin ointment  1 application Nasal BID  . pantoprazole  40 mg Oral BID  . polyethylene glycol  17 g Oral Daily  . [START ON 01/31/2020] potassium chloride  10 mEq Oral Q Mon  . predniSONE  40  mg Oral Q breakfast  . sodium chloride flush  3 mL Intravenous Q12H  . thiamine  100 mg Oral Daily   Continuous Infusions: . sodium chloride 250 mL (01/27/20 0914)   PRN Meds:.sodium chloride, albuterol, ipratropium-albuterol, lip balm, LORazepam, morphine injection, ondansetron **OR** ondansetron (ZOFRAN) IV, oxyCODONE  Antibiotics  :   Anti-infectives (From admission, onward)   None       Objective   Vitals:   01/29/20 0057 01/29/20 0339 01/29/20 0451 01/29/20 0923  BP: 119/81 136/83  128/71  Pulse: 81 75  90  Resp: Temp: 97.6 F (36.4 C)   97.8 F (36.6 C)  TempSrc: Oral   Oral  SpO2: 94% 100%  96%  Weight:   49.4 kg   Height:        SpO2: 96 % O2 Flow Rate (L/min): 3 L/min FiO2 (%): 35 %  Wt Readings from Last 3 Encounters:  01/29/20 49.4 kg  01/13/20 50.8 kg  11/02/19 54.4 kg     Intake/Output Summary (Last 24 hours) at 01/29/2020 1134 Last data filed at 01/28/2020 1407 Gross per 24 hour  Intake 150 ml  Output 450 ml  Net -300 ml    Physical Exam:     Awake Alert, oriented to self, place, context, not to time, normal affect Right hand grip decreased strength (chronic with arthritic changes in bilateral hands from RA) Diffuse crackles in all lung fields, normal respiratory effort on 4L nasal cannula, no wheezing Left AKA Bruising around mouth,  Regular rate and rhythm, SEM appreciated, no edema Abdomen soft, nondistended, non-tender    I have personally reviewed the following:   Data Reviewed:  CBC Recent Labs  Lab 01/25/20 0311 01/25/20 0342 01/25/20 0624 01/26/20 0427 01/27/20 0436 01/28/20 0523 01/29/20 0244  WBC 5.6  --   --  8.5 9.3 11.0* 10.5  HGB 12.5   < > 12.9 12.8 14.2 14.5 14.3  HCT 41.9   < > 38.0 42.4 45.7 47.7* 47.0*  PLT 177  --   --  178 231 200 167  MCV 97.7  --   --  94.9 93.5 93.3 94.0  MCH 29.1  --   --  28.6 29.0 28.4 28.6  MCHC 29.8*  --   --  30.2 31.1 30.4 30.4  RDW 15.3  --   --  15.5 15.8*  15.6* 15.8*  LYMPHSABS 0.3*  --   --   --   --   --   --   MONOABS 0.1  --   --   --   --   --   --   EOSABS 0.0  --   --   --   --   --   --   BASOSABS 0.0  --   --   --   --   --   --    < > = values in this interval not displayed.    Chemistries  Recent Labs  Lab 01/25/20 0311 01/25/20 1144 01/25/20 1522 01/26/20 0427 01/27/20 0436 01/28/20 0523 01/29/20 0244  NA  --  137  --  136 139 135 138  K  --  4.7  --  5.6* 3.4* 3.8 3.9  CL  --  97*  --  97* 90* 92* 93*  CO2  --  29  --  29 38* 34* 37*  GLUCOSE  --  202*  --  129* 124* 92 96  BUN  --  40*  --  32* 27* 33* 30*  CREATININE  --  1.28*  --  0.86 0.83 0.90 0.75  CALCIUM  --  8.9  --  8.8* 8.8* 8.5* 8.4*  MG  --   --   --  1.7 1.7 2.0 1.9  AST   < >  --  733* 411* 271* 141* 51*  ALT   < >  --  345* 311* 286* 225* 138*  ALKPHOS   < >  --  132* 122 101 93 79  BILITOT   < >  --  0.9 1.0 0.9 1.2 1.1   < > = values in this interval not displayed.   ------------------------------------------------------------------------------------------------------------------ No results for input(s): CHOL, HDL, LDLCALC, TRIG, CHOLHDL, LDLDIRECT in the last 72 hours.  No results found for: HGBA1C ------------------------------------------------------------------------------------------------------------------ No results for input(s): TSH, T4TOTAL, T3FREE, THYROIDAB in the last 72 hours.  Invalid input(s): FREET3 ------------------------------------------------------------------------------------------------------------------ No results for input(s): VITAMINB12, FOLATE, FERRITIN, TIBC, IRON, RETICCTPCT in the last 72 hours.  Coagulation profile Recent Labs  Lab 01/25/20 0311  INR 1.5*    No results for input(s): DDIMER in the last 72 hours.  Cardiac  Enzymes No results for input(s): CKMB, TROPONINI, MYOGLOBIN in the last 168 hours.  Invalid input(s):  CK ------------------------------------------------------------------------------------------------------------------    Component Value Date/Time   BNP 1,062.7 (H) 01/25/2020 1610    Micro Results Recent Results (from the past 240 hour(s))  Culture, blood (Routine x 2)     Status: None (Preliminary result)   Collection Time: 01/25/20  3:00 AM   Specimen: BLOOD  Result Value Ref Range Status   Specimen Description BLOOD RIGHT ARM  Final   Special Requests   Final    BOTTLES DRAWN AEROBIC AND ANAEROBIC Blood Culture results may not be optimal due to an excessive volume of blood received in culture bottles   Culture   Final    NO GROWTH 3 DAYS Performed at [facility], [address], [place], [place] [address]    Report Status PENDING  Incomplete  Culture, blood (Routine x 2)     Status: None (Preliminary result)   Collection Time: 01/25/20  3:10 AM   Specimen: BLOOD  Result Value Ref Range Status   Specimen Description BLOOD RIGHT HAND  Final   Special Requests   Final    BOTTLES DRAWN AEROBIC ONLY Blood Culture adequate volume   Culture   Final    NO GROWTH 3 DAYS Performed at [facility], [address], [place], [place] [address]    Report Status PENDING  Incomplete  SARS Coronavirus 2 by RT PCR (hospital order, performed in [facility] hospital lab) Nasopharyngeal Nasopharyngeal Swab     Status: None   Collection Time: 01/25/20  3:26 AM   Specimen: Nasopharyngeal Swab  Result Value Ref Range Status   SARS Coronavirus 2 NEGATIVE NEGATIVE Final    Comment: (NOTE) SARS-CoV-2 target nucleic acids are NOT DETECTED.  The SARS-CoV-2 RNA is generally detectable in upper and lower respiratory specimens during the acute phase of infection. The lowest concentration of SARS-CoV-2 viral copies this assay can detect is 250 copies / mL. A negative result does not preclude SARS-CoV-2 infection and should not be used as the sole basis for treatment or other patient  management decisions.  A negative result may occur with improper specimen collection / handling, submission of specimen other than nasopharyngeal swab, presence of viral mutation(s) within the areas targeted by this assay, and inadequate number of viral copies (<250 copies / mL). A negative result must be combined with clinical observations, patient history, and epidemiological information.  Fact Sheet for Patients:   BoilerBrush.com.cy  Fact Sheet for Healthcare Providers: https://pope.com/  This test is not yet approved or  cleared by the [place] FDA and has been authorized for detection and/or diagnosis of SARS-CoV-2 by FDA under an Emergency Use Authorization (EUA).  This EUA will remain in effect (meaning this test can be used) for the duration of the COVID-19 declaration under Section 564(b)(1) of the Act, 21 U.S.C. section 360bbb-3(b)(1), unless the authorization is terminated or revoked sooner.  Performed at [facility], [address], [place], [place] [address]   MRSA PCR Screening     Status: Abnormal   Collection Time: 01/28/20 12:31 AM   Specimen: Nasopharyngeal  Result Value Ref Range Status   MRSA by PCR POSITIVE (A) NEGATIVE Final    Comment:        The GeneXpert MRSA Assay (FDA approved for NASAL specimens only), is one component of a comprehensive MRSA colonization surveillance program. It is not intended to diagnose MRSA infection nor to  guide or monitor treatment for MRSA infections. RESULT CALLED TO, READ BACK BY AND VERIFIED WITH: A ROBERTS RN 01/28/20 0407 JDW Performed at [facility], [address] [place], [place] [address]     Radiology Reports US Abdomen Limited  Result Date: 01/25/2020 CLINICAL DATA:  74 year old female with elevated LFTs. EXAM: ULTRASOUND ABDOMEN LIMITED RIGHT UPPER QUADRANT COMPARISON:  Right upper quadrant ultrasound dated 02/14/2006. FINDINGS:  Gallbladder: Cholecystectomy. Common bile duct: Diameter: 4 mm Liver: There is diffuse increased liver echogenicity most commonly seen in the setting of fatty infiltration. Superimposed inflammation or fibrosis is not excluded. Clinical correlation is recommended. Portal vein is patent on color Doppler imaging with normal direction of blood flow towards the liver. Other: There is a partially visualized and incompletely evaluated 2 cm right renal upper pole cyst. IMPRESSION: 1. Cholecystectomy. 2. Fatty liver. 3. Patent main portal vein with hepatopetal flow. Electronically Signed   By: Elgie Collard M.D.   On: 01/25/2020 18:14   DG Chest Port 1 View  Result Date: 01/26/2020 CLINICAL DATA:  Shortness of breath EXAM: PORTABLE CHEST 1 VIEW COMPARISON:  01/25/2020 FINDINGS: Bilateral chronic interstitial thickening. Bilateral emphysematous changes. No focal consolidation. No pleural effusion or pneumothorax. Heart and mediastinal contours are unremarkable. No acute osseous abnormality. IMPRESSION: No active disease. Electronically Signed   By: Elige Ko   On: 01/26/2020 16:23   DG Chest Portable 1 View  Result Date: 01/25/2020 CLINICAL DATA:  Shortness of breath. EXAM: PORTABLE CHEST 1 VIEW COMPARISON:  10/13/2019, CT 08/07/2018 FINDINGS: Mild cardiomegaly. Unchanged mediastinal contours. Emphysema with interstitial coarsening. Bibasilar reticulation consistent with honeycombing. No acute or confluent airspace disease. No pleural effusion or pneumothorax. Bones are under mineralized. No acute osseous abnormalities are seen. IMPRESSION: 1. Cardiomegaly. 2. Emphysema.  Basilar honeycombing/fibrosis. Electronically Signed   By: Narda Rutherford M.D.   On: 01/25/2020 03:10   ECHOCARDIOGRAM COMPLETE  Result Date: 01/26/2020    ECHOCARDIOGRAM REPORT   Patient Name:   SANJA ELIZARDO Augustus Date of Exam: 01/26/2020 Medical Rec #:  [id number]          Height:       66.0 in Accession #:    [id number]         Weight:        112.0 lb Date of Birth:  27-Sep-1945          BSA:          1.563 m Patient Age:    73 years           BP:           122/69 mmHg Patient Gender: F                  HR:           105 bpm. Exam Location:  Inpatient Procedure: 2D Echo Indications:    cardiomyopathy  History:        Patient has prior history of Echocardiogram examinations, most                 recent 10/07/2018. COPD; Risk Factors:Hypertension and                 Dyslipidemia.  Sonographer:    Celene Skeen RDCS (AE) Referring Phys: [phone] [facility] A SMITH  Sonographer Comments: restricted mobility - patient unable to turn on left side. additionally, head of the patient unable to lower significantly, which made imaging challenging IMPRESSIONS  1. Marded right sided heart  failure.  2. Left ventricular ejection fraction, by estimation, is 45 to 50%. The left ventricle has mildly decreased function. The left ventricle demonstrates global hypokinesis. Left ventricular diastolic parameters were normal.  3. Right ventricular systolic function is severely reduced. The right ventricular size is severely enlarged. There is severely elevated pulmonary artery systolic pressure.  4. Left atrial size was mildly dilated.  5. Right atrial size was severely dilated.  6. The mitral valve is abnormal. Mild mitral valve regurgitation. No evidence of mitral stenosis.  7. Tricuspid valve regurgitation is severe.  8. The aortic valve is tricuspid. Aortic valve regurgitation is mild. Mild to moderate aortic valve sclerosis/calcification is present, without any evidence of aortic stenosis.  9. The inferior vena cava is normal in size with greater than 50% respiratory variability, suggesting right atrial pressure of 3 mmHg. FINDINGS  Left Ventricle: Left ventricular ejection fraction, by estimation, is 45 to 50%. The left ventricle has mildly decreased function. The left ventricle demonstrates global hypokinesis. The left ventricular internal cavity size was normal in size.  There is  no left ventricular hypertrophy. Left ventricular diastolic parameters were normal. Right Ventricle: The right ventricular size is severely enlarged. Right vetricular wall thickness was not assessed. Right ventricular systolic function is severely reduced. There is severely elevated pulmonary artery systolic pressure. The tricuspid regurgitant velocity is 3.91 m/s, and with an assumed right atrial pressure of 10 mmHg, the estimated right ventricular systolic pressure is 71.2 mmHg. Left Atrium: Left atrial size was mildly dilated. Right Atrium: Right atrial size was severely dilated. Pericardium: There is no evidence of pericardial effusion. Mitral Valve: The mitral valve is abnormal. There is moderate thickening of the mitral valve leaflet(s). There is moderate calcification of the mitral valve leaflet(s). Normal mobility of the mitral valve leaflets. Moderate mitral annular calcification. Mild mitral valve regurgitation. No evidence of mitral valve stenosis. Tricuspid Valve: The tricuspid valve is normal in structure. Tricuspid valve regurgitation is severe. No evidence of tricuspid stenosis. Aortic Valve: The aortic valve is tricuspid. Aortic valve regurgitation is mild. Mild to moderate aortic valve sclerosis/calcification is present, without any evidence of aortic stenosis. Pulmonic Valve: The pulmonic valve was normal in structure. Pulmonic valve regurgitation is not visualized. No evidence of pulmonic stenosis. Aorta: The aortic root is normal in size and structure. Venous: The inferior vena cava is normal in size with greater than 50% respiratory variability, suggesting right atrial pressure of 3 mmHg. IAS/Shunts: No atrial level shunt detected by color flow Doppler. Additional Comments: Marded right sided heart failure.  LEFT VENTRICLE PLAX 2D LVIDd:         4.20 cm LVIDs:         2.80 cm LV PW:         1.40 cm LV IVS:        1.00 cm LVOT diam:     2.00 cm LV SV:         35 LV SV Index:   23 LVOT  Area:     3.14 cm  LEFT ATRIUM         Index      RIGHT ATRIUM           Index LA diam:    4.00 cm 2.56 cm/m RA Area:     25.90 cm                                RA Volume:  96.80 ml  61.93 ml/m  AORTIC VALVE LVOT Vmax:   73.10 cm/s LVOT Vmean:  43.500 cm/s LVOT VTI:    0.113 m TRICUSPID VALVE TR Peak grad:   61.2 mmHg TR Vmax:        391.00 cm/s  SHUNTS Systemic VTI:  0.11 m Systemic Diam: 2.00 cm Charlton Haws MD Electronically signed by Charlton Haws MD Signature Date/Time: 01/26/2020/10:51:37 AM    Final    DG ESOPHAGUS W SINGLE CM (SOL OR THIN BA)  Result Date: 01/27/2020 CLINICAL DATA:  74 year old female inpatient with COPD and dementia with reported dysphagia and failure to thrive. EXAM: ESOPHOGRAM/BARIUM SWALLOW TECHNIQUE: Single contrast examination was performed using  thin barium. FLUOROSCOPY TIME:  Fluoroscopy Time:  1 minutes 18 seconds Number of Acquired Spot Images: 1 COMPARISON:  08/07/2026 chest CT. FINDINGS: Examination significantly limited by patient mental status and mobility limitations. Examination performed with the patient in the left lateral decubitus position with slight head elevation. No laryngeal penetration or tracheobronchial aspiration observed. Small to moderate hiatal hernia. Unable to assess for gastroesophageal reflux. No gross esophageal mass or ulcer. A small traction diverticulum is noted along the right midthoracic esophagus. There is a suggestion of a short segment of mild smooth narrowing in the lower thoracic esophagus at the esophagogastric junction. Otherwise normal esophageal distensibility. Suggestion of moderate esophageal dysmotility with proximal escape and tertiary contractions. Barium tablet not administered due to above patient limitations. IMPRESSION: 1. Significantly limited study, see comments. 2. Small to moderate hiatal hernia. 3. Suggestion of a short segment of mild smooth narrowing in the lower thoracic esophagus at the esophagogastric junction,  cannot exclude a mild peptic stricture, see comments. No gross esophageal mass. 4. 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 5. Small traction diverticulum along the right mid thoracic esophagus. 6. No laryngeal penetration or tracheobronchial aspiration observed. Electronically Signed   By: Delbert Phenix M.D.   On: 01/27/2020 11:36     Time Spent in minutes  30     Laverna Peace M.D on 01/29/2020 at 11:34 AM  To page go to [email or web address] - password [facility]

## 2020-01-29 NOTE — Consult Note (Signed)
Ref: Blane Ohara, MD   Subjective:  Resting comfortably. EGD with esophageal dilatation yesterday. VS stable.  Objective:  Vital Signs in the last 24 hours: Temp:  [97.6 F (36.4 C)-98.8 F (37.1 C)] 97.6 F (36.4 C) (08/21 0057) Pulse Rate:  [75-88] 75 (08/21 0339) Cardiac Rhythm: Normal sinus rhythm (08/20 1900) Resp:  [14-19] 19 (08/21 0339) BP: (104-142)/(59-89) 136/83 (08/21 0339) SpO2:  [92 %-100 %] 100 % (08/21 0339) Weight:  [49.4 kg] 49.4 kg (08/21 0451)  Physical Exam: BP Readings from Last 1 Encounters:  01/29/20 136/83     Wt Readings from Last 1 Encounters:  01/29/20 49.4 kg    Weight change: -4.99 kg Body mass index is 17.59 kg/m. HEENT: [place]/AT, Eyes-Blue, Conjunctiva-Pink, Sclera-Non-icteric Neck: No JVD, No bruit, Trachea midline. Lungs:  Clearing, Bilateral. Cardiac:  Regular rhythm, normal S1 and S2, no S3. II/VI systolic murmur. Abdomen:  Soft, non-tender. BS present. Extremities:  No edema present. No cyanosis. No clubbing. Left AKA CNS: Cranial nerves grossly intact.  Skin: Warm and dry.   Intake/Output from previous day: 08/20 0701 - 08/21 0700 In: 150 [I.V.:150] Out: 450 [Urine:450]    Lab Results: BMET    Component Value Date/Time   NA 138 01/29/2020 0244   NA 135 01/28/2020 0523   NA 139 01/27/2020 0436   K 3.9 01/29/2020 0244   K 3.8 01/28/2020 0523   K 3.4 (L) 01/27/2020 0436   CL 93 (L) 01/29/2020 0244   CL 92 (L) 01/28/2020 0523   CL 90 (L) 01/27/2020 0436   CO2 37 (H) 01/29/2020 0244   CO2 34 (H) 01/28/2020 0523   CO2 38 (H) 01/27/2020 0436   GLUCOSE 96 01/29/2020 0244   GLUCOSE 92 01/28/2020 0523   GLUCOSE 124 (H) 01/27/2020 0436   BUN 30 (H) 01/29/2020 0244   BUN 33 (H) 01/28/2020 0523   BUN 27 (H) 01/27/2020 0436   CREATININE 0.75 01/29/2020 0244   CREATININE 0.90 01/28/2020 0523   CREATININE 0.83 01/27/2020 0436   CALCIUM 8.4 (L) 01/29/2020 0244   CALCIUM 8.5 (L) 01/28/2020 0523   CALCIUM 8.8 (L) 01/27/2020  0436   GFRNONAA >60 01/29/2020 0244   GFRNONAA >60 01/28/2020 0523   GFRNONAA >60 01/27/2020 0436   GFRAA >60 01/29/2020 0244   GFRAA >60 01/28/2020 0523   GFRAA >60 01/27/2020 0436   CBC    Component Value Date/Time   WBC 10.5 01/29/2020 0244   RBC 5.00 01/29/2020 0244   HGB 14.3 01/29/2020 0244   HCT 47.0 (H) 01/29/2020 0244   PLT 167 01/29/2020 0244   MCV 94.0 01/29/2020 0244   MCH 28.6 01/29/2020 0244   MCHC 30.4 01/29/2020 0244   RDW 15.8 (H) 01/29/2020 0244   LYMPHSABS 0.3 (L) 01/25/2020 0311   MONOABS 0.1 01/25/2020 0311   EOSABS 0.0 01/25/2020 0311   BASOSABS 0.0 01/25/2020 0311   HEPATIC Function Panel Recent Labs    01/27/20 0436 01/28/20 0523 01/29/20 0244  PROT 7.2 6.8 6.4*   HEMOGLOBIN A1C No components found for: HGA1C,  MPG CARDIAC ENZYMES Lab Results  Component Value Date   CKTOTAL 27 (L) 01/25/2020   BNP No results for input(s): PROBNP in the last 8760 hours. TSH Recent Labs    01/26/20 0929  TSH 0.575   CHOLESTEROL No results for input(s): CHOL in the last 8760 hours.  Scheduled Meds: . ALPRAZolam  0.25 mg Oral BID  . aspirin EC  81 mg Oral Daily  . Chlorhexidine Gluconate Cloth  6 each Topical Q0600  . digoxin  0.125 mg Oral Q M,W,F  . diltiazem  30 mg Oral TID  . docusate sodium  100 mg Oral BID  . DULoxetine  60 mg Oral Daily  . enoxaparin (LOVENOX) injection  40 mg Subcutaneous Q24H  . ferrous sulfate  325 mg Oral Q breakfast  . folic acid  1 mg Oral Daily  . [START ON 01/31/2020] furosemide  20 mg Oral Q Mon  . gabapentin  100 mg Oral BID  . levETIRAcetam  500 mg Oral BID  . mupirocin ointment  1 application Nasal BID  . pantoprazole  40 mg Oral BID  . polyethylene glycol  17 g Oral Daily  . [START ON 01/31/2020] potassium chloride  10 mEq Oral Q Mon  . predniSONE  40 mg Oral Q breakfast  . sodium chloride flush  3 mL Intravenous Q12H  . thiamine  100 mg Oral Daily   Continuous Infusions: . sodium chloride 250 mL (01/27/20  0914)   PRN Meds:.sodium chloride, albuterol, ipratropium-albuterol, lip balm, LORazepam, morphine injection, ondansetron **OR** ondansetron (ZOFRAN) IV, oxyCODONE  Assessment/Plan: Acute on chronic respiratory failure with hypoxia and hypercapnia COPD Pulmonary fibrosis Severe pulmonary hypertension Acute systolic left heart failure Abnormal liver enzymes/liver congestion Rheumatoid arthritis Left AKA PVD Vascular dementia Hyperlipidemia  Continue medical treatment   LOS: 4 days   Time spent including chart review, lab review, examination, discussion with patient : 20 min   Orpah Cobb  MD  01/29/2020, 8:22 AM

## 2020-01-29 NOTE — Progress Notes (Signed)
Pt just had 150 cc urine. Bladder scan showed 240. Will monitor

## 2020-01-30 ENCOUNTER — Encounter (HOSPITAL_COMMUNITY): Payer: Self-pay | Admitting: Internal Medicine

## 2020-01-30 ENCOUNTER — Inpatient Hospital Stay (HOSPITAL_COMMUNITY): Payer: Medicare Other

## 2020-01-30 DIAGNOSIS — K224 Dyskinesia of esophagus: Secondary | ICD-10-CM

## 2020-01-30 DIAGNOSIS — R6510 Systemic inflammatory response syndrome (SIRS) of non-infectious origin without acute organ dysfunction: Secondary | ICD-10-CM

## 2020-01-30 LAB — COMPREHENSIVE METABOLIC PANEL
ALT: 92 U/L — ABNORMAL HIGH (ref 0–44)
AST: 24 U/L (ref 15–41)
Albumin: 2.4 g/dL — ABNORMAL LOW (ref 3.5–5.0)
Alkaline Phosphatase: 80 U/L (ref 38–126)
Anion gap: 12 (ref 5–15)
BUN: 23 mg/dL (ref 8–23)
CO2: 34 mmol/L — ABNORMAL HIGH (ref 22–32)
Calcium: 8.4 mg/dL — ABNORMAL LOW (ref 8.9–10.3)
Chloride: 93 mmol/L — ABNORMAL LOW (ref 98–111)
Creatinine, Ser: 0.7 mg/dL (ref 0.44–1.00)
GFR calc Af Amer: >60 mL/min (ref >60)
GFR calc non Af Amer: >60 mL/min (ref >60)
Glucose, Bld: 99 mg/dL (ref 70–99)
Potassium: 3.8 mmol/L (ref 3.5–5.1)
Sodium: 139 mmol/L (ref 135–145)
Total Bilirubin: 1.2 mg/dL (ref 0.3–1.2)
Total Protein: 6.4 g/dL — ABNORMAL LOW (ref 6.5–8.1)

## 2020-01-30 LAB — CBC
HCT: 47.8 % — ABNORMAL HIGH (ref 36.0–46.0)
Hemoglobin: 14.6 g/dL (ref 12.0–15.0)
MCH: 28.9 pg (ref 26.0–34.0)
MCHC: 30.5 g/dL (ref 30.0–36.0)
MCV: 94.5 fL (ref 80.0–100.0)
Platelets: 171 10*3/uL (ref 150–400)
RBC: 5.06 MIL/uL (ref 3.87–5.11)
RDW: 15.4 % (ref 11.5–15.5)
WBC: 14.7 10*3/uL — ABNORMAL HIGH (ref 4.0–10.5)
nRBC: 0 % (ref 0.0–0.2)

## 2020-01-30 LAB — MAGNESIUM: Magnesium: 1.7 mg/dL (ref 1.7–2.4)

## 2020-01-30 LAB — GLUCOSE, CAPILLARY
Glucose-Capillary: 160 mg/dL — ABNORMAL HIGH (ref 70–99)
Glucose-Capillary: 213 mg/dL — ABNORMAL HIGH (ref 70–99)

## 2020-01-30 LAB — CULTURE, BLOOD (ROUTINE X 2)
Culture: NO GROWTH
Culture: NO GROWTH
Special Requests: ADEQUATE

## 2020-01-30 LAB — PHOSPHORUS: Phosphorus: 1.9 mg/dL — ABNORMAL LOW (ref 2.5–4.6)

## 2020-01-30 MED ORDER — K PHOS MONO-SOD PHOS DI & MONO 155-852-130 MG PO TABS
250.0000 mg | ORAL_TABLET | Freq: Four times a day (QID) | ORAL | Status: DC
  Administered 2020-01-30 – 2020-02-03 (×18): 250 mg via ORAL
  Filled 2020-01-30 (×19): qty 1

## 2020-01-30 NOTE — Progress Notes (Signed)
   01/30/20 1614  Assess: MEWS Score  Temp 98.8 F (37.1 C)  BP 115/83  Pulse Rate (!) 118  Resp 18  SpO2 93 %  O2 Device Nasal Cannula  Patient Activity (if Appropriate) In bed  O2 Flow Rate (L/min) 2 L/min  Assess: MEWS Score  MEWS Temp 0  MEWS Systolic 0  MEWS Pulse 2  MEWS RR 0  MEWS LOC 0  MEWS Score 2  MEWS Score Color Yellow  Assess: if the MEWS score is Yellow or Red  Were vital signs taken at a resting state? Yes  Focused Assessment No change from prior assessment  MEWS guidelines implemented *See Row Information* No, previously yellow, continue vital signs every 4 hours  Treat  MEWS Interventions Escalated (See documentation below)  Pain Scale 0-10  Pain Score 0  Notify: Provider  Provider Name/Title Dr. Roberto Scales  Date Provider Notified 01/30/20  Time Provider Notified 1615  Notification Type  (Secure chat)  Response Other (Comment) (awaiting response)   - MEWS scored fired YELLOW due to elevated HR -Pt. Asymptomatic, vitals checked ( see chart) - DR. Nettey informed, awaiting orders

## 2020-01-30 NOTE — Progress Notes (Addendum)
[facility]  PROGRESS NOTE  [name]:[id number] DOB: September 03, 1945 DOA: 01/25/2020 PCP: Blane Ohara, MD Admit date - 01/25/2020   Admitting Physician Clydie Braun, MD  Outpatient Primary MD for the patient is [name], [name], MD  LOS - 5 Brief Narrative   Whitney Steele is a 74 y.o. year old female with medical history significant for chronic hypoxic respiratory failure on 3 L secondary to COPD/pulmonary fibrosis, RA, hypothyroidism, vascular dementia, HTN, HLD, dysphagia secondary to history of esophageal stricture who presented on 01/25/2020 with worsening shortness of breath at her facility and found to have O2 saturations in the mid 70s per EMS requiring nonrebreather to improve sats to 90%  Hospital course complicated requiring BiPAP after initial ABG revealed pH of 7.25 and PCO2 of 73 with PO2 of 84 in the ED.  Also noted to have elevated AST of 698, ALT of 289, BNP greater than 1000 with chest x-ray showing cardiomegaly emphysema and pulmonary fibrosis.  PCCM was consulted on admission.  Patient is able to transition down to nasal cannula without recurrent need of BiPAP.  Underwent esophagram which showed concern for potential peptic stricture and presbyesophagus.  Additionally TTE showed RVSP greater than 70 prompting cardiology consultation for further assistance.  Barium esophagram consistent with presbyesophagus as well as possible area of stricture. Underwent EGD with dilation with GI on 8/20.  Subjective  Ate breakfast this am. Feels nauseous. No difficulty swallowing. Denies CP or SOB  A & P    Acute on chronic hypoxic/hypercapnic respiratory failure presumed secondary to combined COPD/CHF exacerbation, improving.  Baseline O2 of 3 L and currently 4 L [place], and maintaining oxygen saturation of 100%, looks comfortable respiratory status, no wheezing on exam. Has not required BiPAP since admission day. Exacerbation likely being driven by chronic emphysema and  pulmonary fibrosis and further complicated by severe pulmonary hypertension.   Covid test negative on admission -Appreciate PCCM recommendations, continue oral prednisone burst for 5 days  duo nebs 3 times daily -BiPAP nightly and as needed,  monitor in progressive unit -Encourage incentive spirometer, flutter valve -Goal SPO2 greater than 88%, continue to wean O2 as able  Severely reduced systolic function with RVSP of 71.  Suspect related to chronic pulmonary hypertension in setting of known chronic emphysema/pulmonary fibrosis.  Did present with elevated BNP and cardiomegaly but clinically no overt signs of volume overload. -Cardiology recommends weekly dosing of Lasix, also added low-dose digoxin  -Daily weights, strict intake/output   Elevated AST/ALT, improving.  On admission AST peaked of a 733 ALT of 345, currently downtrending.  Suspect related to hepatic congestion from right-sided heart failure.  Hepatitis panel unremarkable, right upper quadrant ultrasound showed fatty liver -monitor CMP  Upper abdominal pain, resolved.    LFTs continue to downtrend, right upper quadrant ultrasound shows no acute pathology.  Having BMs. Lipase wnl. 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 S/pg EGD and dilation by GI on 8/20. Also found to have large hiatal hernia.  Tolerating diet -PPI for hiatal hernia --tolerating soft diet  AKI with hyperkalemia, resolved.  Peak creatinine of 1.3 on admission potassium 6.1, improved with Lokelma and IV fluids, suspect prerenal -Monitor BMP, avoid nephrotoxins, monitor output  Unwitnessed fall from bed.  No fractures on imaging.  CK unremarkable.  Nursing facility did not report any fall.  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  Alert and oriented x to person, place, intermittently to time following commands -Delirium precautions  -continue home Cymbalta, Xanax  HLD -Discontinued home Lipitor given elevated LFTs per cardiology  Pain -Continue home Neurontin for   Seizure history -Continue home Keppra -Seizure precautions  SIRS Criteria. New tachycardia and leukocytosis. She mentions nausea but no abdominal pain, she remains afebrile. No changes in her O2 requirements. On reassessment this afternoon her main complaint is lightheadedness. TSH wnl --blood cultures if becomes febrile --no current indication for antibiotics given low suspicion for infection currently.  --Repeat CXR, obtain abdominal XR   Family Communication  : None, will update daughter [name]  Code Status : DNR, discussed on day of admission  Disposition Plan  :  Patient is from skilled nursing facility. Anticipated d/c date: 1 to 2days. Barriers to d/c or necessity for inpatient status:  some nausea and hypophosphatemia, will monitor and correct, Consults  : GI, PCCM, cardiology  Procedures  :   TTE, 8/18;   Barium esophagram 8/19  EGD 8/20 Tortuous esophagus. - Low-grade of narrowing Schatzki ring. Dilated. - Large hiatal hernia. - No gross lesions in the stomach. - Normal examined duodenum. - No specimens collected.  DVT Prophylaxis  :  Lovenox   Lab Results  Component Value Date   PLT 171 01/30/2020    Diet :  Diet Order            DIET SOFT Room service appropriate? Yes; Fluid consistency: Thin  Diet effective now                  Inpatient Medications Scheduled Meds: . ALPRAZolam  0.25 mg Oral BID  . aspirin EC  81 mg Oral Daily  . Chlorhexidine Gluconate Cloth  6 each Topical Q0600  . digoxin  0.125 mg Oral Q M,W,F  . diltiazem  30 mg Oral TID  . docusate sodium   100 mg Oral BID  . DULoxetine  60 mg Oral Daily  . enoxaparin (LOVENOX) injection  40 mg Subcutaneous Q24H  . ferrous sulfate  325 mg Oral Q breakfast  . folic acid  1 mg Oral Daily  . [START ON 01/31/2020] furosemide  20 mg Oral Q Mon  . gabapentin  100 mg Oral BID  . levETIRAcetam  500 mg Oral BID  . mupirocin ointment  1 application Nasal BID  . pantoprazole  40 mg Oral BID  . polyethylene glycol  17 g Oral Daily  . [START ON 01/31/2020] potassium chloride  10 mEq Oral Q Mon  . predniSONE  40 mg Oral Q breakfast  . sodium chloride flush  3 mL Intravenous Q12H  . thiamine  100 mg Oral Daily   Continuous Infusions: . sodium chloride 250 mL (01/27/20 0914)   PRN Meds:.sodium chloride, albuterol, ipratropium-albuterol, lip balm, LORazepam, morphine injection, ondansetron **OR** ondansetron (ZOFRAN) IV, oxyCODONE  Antibiotics  :   Anti-infectives (From admission, onward)   None       Objective   Vitals:   01/30/20 0500 01/30/20 0521 01/30/20 0604 01/30/20 0751  BP:  115/80 114/81 116/69  Pulse:  (!) 103 (!) 106 (!) 107  Resp:  18  20  Temp:  98.6 F (37 C) 98 F (36.7 C) 99.4 F (37.4 C)  TempSrc:  Oral  Oral  SpO2:  92% (!) 81% Marland Kitchen)  87%  Weight: 49.4 kg     Height:        SpO2: (!) 87 % O2 Flow Rate (L/min): 4 L/min FiO2 (%): 35 %  Wt Readings from Last 3 Encounters:  01/30/20 49.4 kg  01/13/20 50.8 kg  11/02/19 54.4 kg     Intake/Output Summary (Last 24 hours) at 01/30/2020 1058 Last data filed at 01/30/2020 0800 Gross per 24 hour  Intake 480 ml  Output 250 ml  Net 230 ml    Physical Exam:     Awake Alert, oriented to self, place, context, not to time, normal affect Right hand grip decreased strength (chronic with arthritic changes in bilateral hands from RA) Diffuse crackles in all lung fields, normal respiratory effort on 4L nasal cannula, no wheezing Left AKA Bruising around mouth,  Regular rate and rhythm, SEM appreciated, no edema Abdomen  soft, nondistended, non-tender, normal bowel sounds    I have personally reviewed the following:   Data Reviewed:  CBC Recent Labs  Lab 01/25/20 0311 01/25/20 0342 01/26/20 0427 01/27/20 0436 01/28/20 0523 01/29/20 0244 01/30/20 0611  WBC 5.6   < > 8.5 9.3 11.0* 10.5 14.7*  HGB 12.5   < > 12.8 14.2 14.5 14.3 14.6  HCT 41.9   < > 42.4 45.7 47.7* 47.0* 47.8*  PLT 177   < > 178 231 200 167 171  MCV 97.7   < > 94.9 93.5 93.3 94.0 94.5  MCH 29.1   < > 28.6 29.0 28.4 28.6 28.9  MCHC 29.8*   < > 30.2 31.1 30.4 30.4 30.5  RDW 15.3   < > 15.5 15.8* 15.6* 15.8* 15.4  LYMPHSABS 0.3*  --   --   --   --   --   --   MONOABS 0.1  --   --   --   --   --   --   EOSABS 0.0  --   --   --   --   --   --   BASOSABS 0.0  --   --   --   --   --   --    < > = values in this interval not displayed.    Chemistries  Recent Labs  Lab 01/26/20 0427 01/27/20 0436 01/28/20 0523 01/29/20 0244 01/30/20 0611  NA 136 139 135 138 139  K 5.6* 3.4* 3.8 3.9 3.8  CL 97* 90* 92* 93* 93*  CO2 29 38* 34* 37* 34*  GLUCOSE 129* 124* 92 96 99  BUN 32* 27* 33* 30* 23  CREATININE 0.86 0.83 0.90 0.75 0.70  CALCIUM 8.8* 8.8* 8.5* 8.4* 8.4*  MG 1.7 1.7 2.0 1.9 1.7  AST 411* 271* 141* 51* 24  ALT 311* 286* 225* 138* 92*  ALKPHOS 122 101 93 79 80  BILITOT 1.0 0.9 1.2 1.1 1.2   ------------------------------------------------------------------------------------------------------------------ No results for input(s): CHOL, HDL, LDLCALC, TRIG, CHOLHDL, LDLDIRECT in the last 72 hours.  No results found for: HGBA1C ------------------------------------------------------------------------------------------------------------------ No results for input(s): TSH, T4TOTAL, T3FREE, THYROIDAB in the last 72 hours.  Invalid input(s): FREET3 ------------------------------------------------------------------------------------------------------------------ No results for input(s): VITAMINB12, FOLATE, FERRITIN, TIBC, IRON,  RETICCTPCT in the last 72 hours.  Coagulation profile Recent Labs  Lab 01/25/20 0311  INR 1.5*    No results for input(s): DDIMER in the last 72 hours.  Cardiac Enzymes No results for input(s): CKMB, TROPONINI, MYOGLOBIN in the last 168 hours.  Invalid input(s): CK ------------------------------------------------------------------------------------------------------------------    Component Value Date/Time  BNP 1,062.7 (H) 01/25/2020 1610    Micro Results Recent Results (from the past 240 hour(s))  Culture, blood (Routine x 2)     Status: None   Collection Time: 01/25/20  3:00 AM   Specimen: BLOOD  Result Value Ref Range Status   Specimen Description BLOOD RIGHT ARM  Final   Special Requests   Final    BOTTLES DRAWN AEROBIC AND ANAEROBIC Blood Culture results may not be optimal due to an excessive volume of blood received in culture bottles   Culture   Final    NO GROWTH 5 DAYS Performed at [facility], [address], [place], [place] [address]    Report Status 01/30/2020 FINAL  Final  Culture, blood (Routine x 2)     Status: None   Collection Time: 01/25/20  3:10 AM   Specimen: BLOOD  Result Value Ref Range Status   Specimen Description BLOOD RIGHT HAND  Final   Special Requests   Final    BOTTLES DRAWN AEROBIC ONLY Blood Culture adequate volume   Culture   Final    NO GROWTH 5 DAYS Performed at [facility], [address], [place], [place] [address]    Report Status 01/30/2020 FINAL  Final  SARS Coronavirus 2 by RT PCR (hospital order, performed in [facility] hospital lab) Nasopharyngeal Nasopharyngeal Swab     Status: None   Collection Time: 01/25/20  3:26 AM   Specimen: Nasopharyngeal Swab  Result Value Ref Range Status   SARS Coronavirus 2 NEGATIVE NEGATIVE Final    Comment: (NOTE) SARS-CoV-2 target nucleic acids are NOT DETECTED.  The SARS-CoV-2 RNA is generally detectable in upper and lower respiratory specimens during the acute phase of  infection. The lowest concentration of SARS-CoV-2 viral copies this assay can detect is 250 copies / mL. A negative result does not preclude SARS-CoV-2 infection and should not be used as the sole basis for treatment or other patient management decisions.  A negative result may occur with improper specimen collection / handling, submission of specimen other than nasopharyngeal swab, presence of viral mutation(s) within the areas targeted by this assay, and inadequate number of viral copies (<250 copies / mL). A negative result must be combined with clinical observations, patient history, and epidemiological information.  Fact Sheet for Patients:   BoilerBrush.com.cy  Fact Sheet for Healthcare Providers: https://pope.com/  This test is not yet approved or  cleared by the [place] FDA and has been authorized for detection and/or diagnosis of SARS-CoV-2 by FDA under an Emergency Use Authorization (EUA).  This EUA will remain in effect (meaning this test can be used) for the duration of the COVID-19 declaration under Section 564(b)(1) of the Act, 21 U.S.C. section 360bbb-3(b)(1), unless the authorization is terminated or revoked sooner.  Performed at [facility], [address] [place], [place] [address]   MRSA PCR Screening     Status: Abnormal   Collection Time: 01/28/20 12:31 AM   Specimen: Nasopharyngeal  Result Value Ref Range Status   MRSA by PCR POSITIVE (A) NEGATIVE Final    Comment:        The GeneXpert MRSA Assay (FDA approved for NASAL specimens only), is one component of a comprehensive MRSA colonization surveillance program. It is not intended to diagnose MRSA infection nor to guide or monitor treatment for MRSA infections. RESULT CALLED TO, READ BACK BY AND VERIFIED WITH: A ROBERTS RN 01/28/20 0407 JDW Performed at [facility] Lab, [address] [place], [place] [address]     Radiology  Reports US Abdomen Limited  Result Date: 01/25/2020 CLINICAL DATA:  74 year old female with elevated LFTs. EXAM: ULTRASOUND ABDOMEN LIMITED RIGHT UPPER QUADRANT COMPARISON:  Right upper quadrant ultrasound dated 02/14/2006. FINDINGS: Gallbladder: Cholecystectomy. Common bile duct: Diameter: 4 mm Liver: There is diffuse increased liver echogenicity most commonly seen in the setting of fatty infiltration. Superimposed inflammation or fibrosis is not excluded. Clinical correlation is recommended. Portal vein is patent on color Doppler imaging with normal direction of blood flow towards the liver. Other: There is a partially visualized and incompletely evaluated 2 cm right renal upper pole cyst. IMPRESSION: 1. Cholecystectomy. 2. Fatty liver. 3. Patent main portal vein with hepatopetal flow. Electronically Signed   By: Elgie Collard M.D.   On: 01/25/2020 18:14   DG Chest Port 1 View  Result Date: 01/26/2020 CLINICAL DATA:  Shortness of breath EXAM: PORTABLE CHEST 1 VIEW COMPARISON:  01/25/2020 FINDINGS: Bilateral chronic interstitial thickening. Bilateral emphysematous changes. No focal consolidation. No pleural effusion or pneumothorax. Heart and mediastinal contours are unremarkable. No acute osseous abnormality. IMPRESSION: No active disease. Electronically Signed   By: Elige Ko   On: 01/26/2020 16:23   DG Chest Portable 1 View  Result Date: 01/25/2020 CLINICAL DATA:  Shortness of breath. EXAM: PORTABLE CHEST 1 VIEW COMPARISON:  10/13/2019, CT 08/07/2018 FINDINGS: Mild cardiomegaly. Unchanged mediastinal contours. Emphysema with interstitial coarsening. Bibasilar reticulation consistent with honeycombing. No acute or confluent airspace disease. No pleural effusion or pneumothorax. Bones are under mineralized. No acute osseous abnormalities are seen. IMPRESSION: 1. Cardiomegaly. 2. Emphysema.  Basilar honeycombing/fibrosis. Electronically Signed   By: Narda Rutherford M.D.   On: 01/25/2020 03:10    ECHOCARDIOGRAM COMPLETE  Result Date: 01/26/2020    ECHOCARDIOGRAM REPORT   Patient Name:   KEIVA DINA Kalbfleisch Date of Exam: 01/26/2020 Medical Rec #:  [id number]          Height:       66.0 in Accession #:    [id number]         Weight:       112.0 lb Date of Birth:  1946-05-10          BSA:          1.563 m Patient Age:    73 years           BP:           122/69 mmHg Patient Gender: F                  HR:           105 bpm. Exam Location:  Inpatient Procedure: 2D Echo Indications:    cardiomyopathy  History:        Patient has prior history of Echocardiogram examinations, most                 recent 10/07/2018. COPD; Risk Factors:Hypertension and                 Dyslipidemia.  Sonographer:    Celene Skeen RDCS (AE) Referring Phys: [phone] [facility] A SMITH  Sonographer Comments: restricted mobility - patient unable to turn on left side. additionally, head of the patient unable to lower significantly, which made imaging challenging IMPRESSIONS  1. Marded right sided heart failure.  2. Left ventricular ejection fraction, by estimation, is 45 to 50%. The left ventricle has mildly decreased function. The left ventricle demonstrates global hypokinesis. Left ventricular diastolic  parameters were normal.  3. Right ventricular systolic function is severely reduced. The right ventricular size is severely enlarged. There is severely elevated pulmonary artery systolic pressure.  4. Left atrial size was mildly dilated.  5. Right atrial size was severely dilated.  6. The mitral valve is abnormal. Mild mitral valve regurgitation. No evidence of mitral stenosis.  7. Tricuspid valve regurgitation is severe.  8. The aortic valve is tricuspid. Aortic valve regurgitation is mild. Mild to moderate aortic valve sclerosis/calcification is present, without any evidence of aortic stenosis.  9. The inferior vena cava is normal in size with greater than 50% respiratory variability, suggesting right atrial pressure of 3 mmHg. FINDINGS   Left Ventricle: Left ventricular ejection fraction, by estimation, is 45 to 50%. The left ventricle has mildly decreased function. The left ventricle demonstrates global hypokinesis. The left ventricular internal cavity size was normal in size. There is  no left ventricular hypertrophy. Left ventricular diastolic parameters were normal. Right Ventricle: The right ventricular size is severely enlarged. Right vetricular wall thickness was not assessed. Right ventricular systolic function is severely reduced. There is severely elevated pulmonary artery systolic pressure. The tricuspid regurgitant velocity is 3.91 m/s, and with an assumed right atrial pressure of 10 mmHg, the estimated right ventricular systolic pressure is 71.2 mmHg. Left Atrium: Left atrial size was mildly dilated. Right Atrium: Right atrial size was severely dilated. Pericardium: There is no evidence of pericardial effusion. Mitral Valve: The mitral valve is abnormal. There is moderate thickening of the mitral valve leaflet(s). There is moderate calcification of the mitral valve leaflet(s). Normal mobility of the mitral valve leaflets. Moderate mitral annular calcification. Mild mitral valve regurgitation. No evidence of mitral valve stenosis. Tricuspid Valve: The tricuspid valve is normal in structure. Tricuspid valve regurgitation is severe. No evidence of tricuspid stenosis. Aortic Valve: The aortic valve is tricuspid. Aortic valve regurgitation is mild. Mild to moderate aortic valve sclerosis/calcification is present, without any evidence of aortic stenosis. Pulmonic Valve: The pulmonic valve was normal in structure. Pulmonic valve regurgitation is not visualized. No evidence of pulmonic stenosis. Aorta: The aortic root is normal in size and structure. Venous: The inferior vena cava is normal in size with greater than 50% respiratory variability, suggesting right atrial pressure of 3 mmHg. IAS/Shunts: No atrial level shunt detected by color flow  Doppler. Additional Comments: Marded right sided heart failure.  LEFT VENTRICLE PLAX 2D LVIDd:         4.20 cm LVIDs:         2.80 cm LV PW:         1.40 cm LV IVS:        1.00 cm LVOT diam:     2.00 cm LV SV:         35 LV SV Index:   23 LVOT Area:     3.14 cm  LEFT ATRIUM         Index      RIGHT ATRIUM           Index LA diam:    4.00 cm 2.56 cm/m RA Area:     25.90 cm                                RA Volume:   96.80 ml  61.93 ml/m  AORTIC VALVE LVOT Vmax:   73.10 cm/s LVOT Vmean:  43.500 cm/s LVOT VTI:    0.113 m TRICUSPID  VALVE TR Peak grad:   61.2 mmHg TR Vmax:        391.00 cm/s  SHUNTS Systemic VTI:  0.11 m Systemic Diam: 2.00 cm Charlton Haws MD Electronically signed by Charlton Haws MD Signature Date/Time: 01/26/2020/10:51:37 AM    Final    DG ESOPHAGUS W SINGLE CM (SOL OR THIN BA)  Result Date: 01/27/2020 CLINICAL DATA:  74 year old female inpatient with COPD and dementia with reported dysphagia and failure to thrive. EXAM: ESOPHOGRAM/BARIUM SWALLOW TECHNIQUE: Single contrast examination was performed using  thin barium. FLUOROSCOPY TIME:  Fluoroscopy Time:  1 minutes 18 seconds Number of Acquired Spot Images: 1 COMPARISON:  08/07/2026 chest CT. FINDINGS: Examination significantly limited by patient mental status and mobility limitations. Examination performed with the patient in the left lateral decubitus position with slight head elevation. No laryngeal penetration or tracheobronchial aspiration observed. Small to moderate hiatal hernia. Unable to assess for gastroesophageal reflux. No gross esophageal mass or ulcer. A small traction diverticulum is noted along the right midthoracic esophagus. There is a suggestion of a short segment of mild smooth narrowing in the lower thoracic esophagus at the esophagogastric junction. Otherwise normal esophageal distensibility. Suggestion of moderate esophageal dysmotility with proximal escape and tertiary contractions. Barium tablet not administered due to  above patient limitations. IMPRESSION: 1. Significantly limited study, see comments. 2. Small to moderate hiatal hernia. 3. Suggestion of a short segment of mild smooth narrowing in the lower thoracic esophagus at the esophagogastric junction, cannot exclude a mild peptic stricture, see comments. No gross esophageal mass. 4. 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 5. Small traction diverticulum along the right mid thoracic esophagus. 6. No laryngeal penetration or tracheobronchial aspiration observed. Electronically Signed   By: Delbert Phenix M.D.   On: 01/27/2020 11:36     Time Spent in minutes  30     Laverna Peace M.D on 01/30/2020 at 10:58 AM  To page go to [email or web address] - password [facility]

## 2020-01-30 NOTE — Consult Note (Signed)
Ref: Blane Ohara, MD   Subjective:  Awake. No chest pain. Denies difficulty swallowing.  VS stable.  Objective:  Vital Signs in the last 24 hours: Temp:  [98 F (36.7 C)-99.4 F (37.4 C)] 98.8 F (37.1 C) (08/22 1614) Pulse Rate:  [76-118] 76 (08/22 2050) Cardiac Rhythm: Normal sinus rhythm (08/22 1900) Resp:  [18-20] 19 (08/22 2050) BP: (114-117)/(69-83) 114/73 (08/22 2050) SpO2:  [81 %-93 %] 93 % (08/22 2050) Weight:  [49.4 kg] 49.4 kg (08/22 0500)  Physical Exam: BP Readings from Last 1 Encounters:  01/30/20 114/73     Wt Readings from Last 1 Encounters:  01/30/20 49.4 kg    Weight change: 0 kg Body mass index is 17.59 kg/m. HEENT: [place]/AT, Eyes-Blue, Conjunctiva-Pink, Sclera-Non-icteric Neck: No JVD, No bruit, Trachea midline. Lungs:  Clearing, Bilateral. Cardiac:  Regular rhythm, normal S1 and S2, no S3. II/VI systolic murmur. Abdomen:  Soft, non-tender. BS present. Extremities:  No edema present. No cyanosis. No clubbing. Left AKA CNS: AxOx3, Cranial nerves grossly intact, moves all 4 extremities.  Skin: Warm and dry.   Intake/Output from previous day: 08/21 0701 - 08/22 0700 In: 360 [P.O.:360] Out: 250 [Urine:250]    Lab Results: BMET    Component Value Date/Time   NA 139 01/30/2020 0611   NA 138 01/29/2020 0244   NA 135 01/28/2020 0523   K 3.8 01/30/2020 0611   K 3.9 01/29/2020 0244   K 3.8 01/28/2020 0523   CL 93 (L) 01/30/2020 0611   CL 93 (L) 01/29/2020 0244   CL 92 (L) 01/28/2020 0523   CO2 34 (H) 01/30/2020 0611   CO2 37 (H) 01/29/2020 0244   CO2 34 (H) 01/28/2020 0523   GLUCOSE 99 01/30/2020 0611   GLUCOSE 96 01/29/2020 0244   GLUCOSE 92 01/28/2020 0523   BUN 23 01/30/2020 0611   BUN 30 (H) 01/29/2020 0244   BUN 33 (H) 01/28/2020 0523   CREATININE 0.70 01/30/2020 0611   CREATININE 0.75 01/29/2020 0244   CREATININE 0.90 01/28/2020 0523   CALCIUM 8.4 (L) 01/30/2020 0611   CALCIUM 8.4 (L) 01/29/2020 0244   CALCIUM 8.5 (L) 01/28/2020  0523   GFRNONAA >60 01/30/2020 0611   GFRNONAA >60 01/29/2020 0244   GFRNONAA >60 01/28/2020 0523   GFRAA >60 01/30/2020 0611   GFRAA >60 01/29/2020 0244   GFRAA >60 01/28/2020 0523   CBC    Component Value Date/Time   WBC 14.7 (H) 01/30/2020 0611   RBC 5.06 01/30/2020 0611   HGB 14.6 01/30/2020 0611   HCT 47.8 (H) 01/30/2020 0611   PLT 171 01/30/2020 0611   MCV 94.5 01/30/2020 0611   MCH 28.9 01/30/2020 0611   MCHC 30.5 01/30/2020 0611   RDW 15.4 01/30/2020 0611   LYMPHSABS 0.3 (L) 01/25/2020 0311   MONOABS 0.1 01/25/2020 0311   EOSABS 0.0 01/25/2020 0311   BASOSABS 0.0 01/25/2020 0311   HEPATIC Function Panel Recent Labs    01/28/20 0523 01/29/20 0244 01/30/20 0611  PROT 6.8 6.4* 6.4*   HEMOGLOBIN A1C No components found for: HGA1C,  MPG CARDIAC ENZYMES Lab Results  Component Value Date   CKTOTAL 27 (L) 01/25/2020   BNP No results for input(s): PROBNP in the last 8760 hours. TSH Recent Labs    01/26/20 0929  TSH 0.575   CHOLESTEROL No results for input(s): CHOL in the last 8760 hours.  Scheduled Meds: . ALPRAZolam  0.25 mg Oral BID  . aspirin EC  81 mg Oral Daily  .  Chlorhexidine Gluconate Cloth  6 each Topical Q0600  . digoxin  0.125 mg Oral Q M,W,F  . diltiazem  30 mg Oral TID  . docusate sodium  100 mg Oral BID  . DULoxetine  60 mg Oral Daily  . enoxaparin (LOVENOX) injection  40 mg Subcutaneous Q24H  . ferrous sulfate  325 mg Oral Q breakfast  . folic acid  1 mg Oral Daily  . [START ON 01/31/2020] furosemide  20 mg Oral Q Mon  . gabapentin  100 mg Oral BID  . levETIRAcetam  500 mg Oral BID  . mupirocin ointment  1 application Nasal BID  . pantoprazole  40 mg Oral BID  . phosphorus  250 mg Oral QID  . polyethylene glycol  17 g Oral Daily  . [START ON 01/31/2020] potassium chloride  10 mEq Oral Q Mon  . predniSONE  40 mg Oral Q breakfast  . sodium chloride flush  3 mL Intravenous Q12H  . thiamine  100 mg Oral Daily   Continuous Infusions: .  sodium chloride 250 mL (01/27/20 0914)   PRN Meds:.sodium chloride, albuterol, ipratropium-albuterol, lip balm, LORazepam, morphine injection, ondansetron **OR** ondansetron (ZOFRAN) IV, oxyCODONE  Assessment/Plan: Acute on chronic respiratory failure with hypoxia and hypercapnia COPD Pulmonary fibrosis Severe pulmonary systolic hypertension Acute systolic left heart failure Chronic systolic right heart failure Rheumatoid arthritis Left AKA PVD Vascular dementia Hyperlipidemia  Continue medical treatment.   LOS: 5 days   Time spent including chart review, lab review, examination, discussion with patient : 25 min   Orpah Cobb  MD  01/30/2020, 9:33 PM

## 2020-01-31 LAB — BASIC METABOLIC PANEL
Anion gap: 9 (ref 5–15)
BUN: 23 mg/dL (ref 8–23)
CO2: 36 mmol/L — ABNORMAL HIGH (ref 22–32)
Calcium: 8.3 mg/dL — ABNORMAL LOW (ref 8.9–10.3)
Chloride: 93 mmol/L — ABNORMAL LOW (ref 98–111)
Creatinine, Ser: 0.68 mg/dL (ref 0.44–1.00)
GFR calc Af Amer: >60 mL/min (ref >60)
GFR calc non Af Amer: >60 mL/min (ref >60)
Glucose, Bld: 96 mg/dL (ref 70–99)
Potassium: 4 mmol/L (ref 3.5–5.1)
Sodium: 138 mmol/L (ref 135–145)

## 2020-01-31 LAB — CBC
HCT: 46.3 % — ABNORMAL HIGH (ref 36.0–46.0)
Hemoglobin: 14 g/dL (ref 12.0–15.0)
MCH: 28.5 pg (ref 26.0–34.0)
MCHC: 30.2 g/dL (ref 30.0–36.0)
MCV: 94.3 fL (ref 80.0–100.0)
Platelets: 161 10*3/uL (ref 150–400)
RBC: 4.91 MIL/uL (ref 3.87–5.11)
RDW: 15.6 % — ABNORMAL HIGH (ref 11.5–15.5)
WBC: 12.4 10*3/uL — ABNORMAL HIGH (ref 4.0–10.5)
nRBC: 0 % (ref 0.0–0.2)

## 2020-01-31 LAB — PHOSPHORUS: Phosphorus: 3.3 mg/dL (ref 2.5–4.6)

## 2020-01-31 LAB — MAGNESIUM: Magnesium: 1.7 mg/dL (ref 1.7–2.4)

## 2020-01-31 NOTE — Plan of Care (Signed)
°  Problem: Education: °Goal: Ability to demonstrate management of disease process will improve °Outcome: Progressing °Goal: Ability to verbalize understanding of medication therapies will improve °Outcome: Progressing °Goal: Individualized Educational Video(s) °Outcome: Progressing °  °

## 2020-01-31 NOTE — Progress Notes (Signed)
[facility]  PROGRESS NOTE  Whitney Steele DPO:[id number] DOB: 1946/05/14 DOA: 01/25/2020 PCP: Blane Ohara, MD Admit date - 01/25/2020   Admitting Physician Clydie Braun, MD  Outpatient Primary MD for the patient is [name], [name], MD  LOS - 6 Brief Narrative   Whitney Steele is a 74 y.o. year old female with medical history significant for chronic hypoxic respiratory failure on 3 L secondary to COPD/pulmonary fibrosis, RA, hypothyroidism, vascular dementia, HTN, HLD, dysphagia secondary to history of esophageal stricture who presented on 01/25/2020 with worsening shortness of breath at her facility and found to have O2 saturations in the mid 70s per EMS requiring nonrebreather to improve sats to 90%  Hospital course complicated requiring BiPAP after initial ABG revealed pH of 7.25 and PCO2 of 73 with PO2 of 84 in the ED.  Also noted to have elevated AST of 698, ALT of 289, BNP greater than 1000 with chest x-ray showing cardiomegaly emphysema and pulmonary fibrosis.  PCCM was consulted on admission.  Patient is able to transition down to nasal cannula without recurrent need of BiPAP.  Underwent esophagram which showed concern for potential peptic stricture and presbyesophagus.  Additionally TTE showed RVSP greater than 70 prompting cardiology consultation for further assistance.  Barium esophagram consistent with presbyesophagus as well as possible area of stricture. Underwent EGD with dilation with GI on 8/20.  Subjective  Yesterday evening had episode of sinus tachycardia.  EKG showed some minor tenderness with T wave inversions however patient had no chest pain.  Abdominal x-ray and chest x-ray were obtained.x-ray was unremarkable.  Chest x-ray showed concern for potential edema versus opacities.  This morning patient has no complaints.  Feels her breathing is better.  Continues to deny any chest pain.  Ate well.  A & P    Acute on chronic hypoxic/hypercapnic respiratory  failure presumed secondary to combined COPD/CHF exacerbation in the setting of pulmonary hypertension, improving.  Baseline O2 of 3 L and currently 2 L [place], and maintaining oxygen saturation of 100%, looks comfortable respiratory status, no wheezing on exam, she always has persistent crackles.  Has not required BiPAP since admission day.  Repeat chest x-ray queries bilateral opacities versus interstitial edema -Appreciate PCCM recommendations, continue oral prednisone burst for 5 days  duo nebs 3 times daily -BiPAP nightly and as needed,  monitor in progressive unit -Monitor response to oral Lasix (given weekly per cardiology recommendation) -Encourage incentive spirometer, flutter valve -Goal SPO2 greater than 88%, continue to wean O2 as able  Severely reduced systolic function with RVSP of 71.  Suspect related to chronic pulmonary hypertension in setting of known chronic emphysema/pulmonary fibrosis.  Did present with elevated BNP and cardiomegaly but clinically no overt signs of volume overload. -Cardiology recommends weekly dosing of Lasix (dosing today), also added low-dose digoxin  -Daily weights, strict intake/output   Elevated AST/ALT, improving.  On admission AST peaked of a 733 ALT of 345, currently downtrending.  Suspect related to hepatic congestion from right-sided heart failure.  Hepatitis panel unremarkable, right upper quadrant ultrasound showed fatty liver -monitor CMP  Upper abdominal pain, resolved.    LFTs continue to downtrend, right upper quadrant ultrasound shows no acute pathology.  Having BMs. Lipase wnl. 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 S/pg EGD and dilation by GI on 8/20. Also found to have large hiatal hernia.  Tolerating diet -PPI for hiatal hernia --tolerating soft diet  AKI with hyperkalemia, resolved.  Peak creatinine of 1.3 on admission potassium 6.1, improved with Lokelma and IV fluids,  suspect prerenal -Monitor BMP, avoid nephrotoxins, monitor output  Unwitnessed fall from bed.  No fractures on imaging.  CK unremarkable.  Nursing facility did not report any fall.  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  Alert and oriented x to person, place, intermittently to time following commands -Delirium precautions  -continue home Cymbalta, Xanax  HLD -Discontinued home Lipitor given elevated LFTs per cardiology  Pain -Continue home Neurontin for   Seizure history -Continue home Keppra -Seizure precautions  SIRS Criteria, improved. New tachycardia and leukocytosis. She mentions nausea but no abdominal pain, she remains afebrile. No changes in her O2 requirements. On reassessment this afternoon her main complaint is lightheadedness. TSH wnl.  Abdominal x-ray was unremarkable, repeat chest x-ray mention bilateral opacities concerning for potential edema just more above -Oral Lasix for edema, monitor --blood cultures if becomes febrile --no current indication for antibiotics given low suspicion for infection currently.     Family Communication  : None, will update daughter [name]  Code Status : DNR, discussed on day of admission  Disposition Plan  :  Patient is from skilled nursing facility. Anticipated d/c date: 1 to 2days. Barriers to d/c or necessity for inpatient status:  Ensure breathing status remained stable, no recurrent concerns for infection given recent SIRS criteria on 8/22, anticipate discharge to facility on 8/24 remained stable , Consults  : GI, PCCM, cardiology  Procedures  :   TTE, 8/18;   Barium esophagram 8/19  EGD 8/20 Tortuous esophagus. -  Low-grade of narrowing Schatzki ring. Dilated. - Large hiatal hernia. - No gross lesions in the stomach. - Normal examined duodenum. - No specimens collected.  DVT Prophylaxis  :  Lovenox   Lab Results  Component Value Date   PLT 161 01/31/2020    Diet :  Diet Order            DIET SOFT Room service appropriate? Yes; Fluid consistency: Thin  Diet effective now                  Inpatient Medications Scheduled Meds: . ALPRAZolam  0.25 mg Oral BID  . aspirin EC  81 mg Oral Daily  . Chlorhexidine Gluconate Cloth  6 each Topical Q0600  . digoxin  0.125 mg Oral Q M,W,F  . diltiazem  30 mg Oral TID  . docusate sodium  100 mg Oral BID  . DULoxetine  60 mg Oral Daily  . enoxaparin (LOVENOX) injection  40 mg Subcutaneous Q24H  . ferrous sulfate  325 mg Oral Q breakfast  . folic acid  1 mg Oral Daily  . furosemide  20 mg Oral Q Mon  . gabapentin  100 mg Oral BID  . levETIRAcetam  500 mg Oral BID  . mupirocin ointment  1 application Nasal BID  . pantoprazole  40 mg Oral BID  . phosphorus  250 mg Oral QID  . polyethylene glycol  17 g Oral Daily  . potassium chloride  10 mEq Oral Q Mon  . predniSONE  40 mg Oral Q breakfast  . sodium chloride flush  3 mL Intravenous Q12H  . thiamine  100 mg Oral Daily   Continuous Infusions: . sodium chloride 250  mL (01/27/20 0914)   PRN Meds:.sodium chloride, albuterol, ipratropium-albuterol, lip balm, LORazepam, morphine injection, ondansetron **OR** ondansetron (ZOFRAN) IV, oxyCODONE  Antibiotics  :   Anti-infectives (From admission, onward)   None       Objective   Vitals:   01/30/20 2345 01/31/20 0342 01/31/20 0351 01/31/20 0840  BP: 121/80  99/70 98/68  Pulse: 74  88 84  Resp: Temp: (!) 97.3 F (36.3 C)  97.8 F (36.6 C) 98.6 F (37 C)  TempSrc: Oral  Oral Oral  SpO2: 95%  90% (!) 89%  Weight:  52.2 kg    Height:        SpO2: (!) 89 % O2 Flow Rate (L/min): 2 L/min FiO2 (%): 35 %  Wt Readings from Last  3 Encounters:  01/31/20 52.2 kg  01/13/20 50.8 kg  11/02/19 54.4 kg     Intake/Output Summary (Last 24 hours) at 01/31/2020 1059 Last data filed at 01/31/2020 0908 Gross per 24 hour  Intake 480 ml  Output 375 ml  Net 105 ml    Physical Exam:     Awake Alert, oriented to self, place, context, not to time, normal affect Right hand grip decreased strength (chronic with arthritic changes in bilateral hands from RA) Diffuse crackles in all lung fields, normal respiratory effort on 2L nasal cannula, no wheezing Left AKA Bruising around mouth,  Regular rate and rhythm, SEM appreciated, no edema Abdomen soft, nondistended, non-tender, normal bowel sounds    I have personally reviewed the following:   Data Reviewed:  CBC Recent Labs  Lab 01/25/20 0311 01/25/20 0342 01/27/20 0436 01/28/20 0523 01/29/20 0244 01/30/20 0611 01/31/20 0643  WBC 5.6   < > 9.3 11.0* 10.5 14.7* 12.4*  HGB 12.5   < > 14.2 14.5 14.3 14.6 14.0  HCT 41.9   < > 45.7 47.7* 47.0* 47.8* 46.3*  PLT 177   < > 231 200 167 171 161  MCV 97.7   < > 93.5 93.3 94.0 94.5 94.3  MCH 29.1   < > 29.0 28.4 28.6 28.9 28.5  MCHC 29.8*   < > 31.1 30.4 30.4 30.5 30.2  RDW 15.3   < > 15.8* 15.6* 15.8* 15.4 15.6*  LYMPHSABS 0.3*  --   --   --   --   --   --   MONOABS 0.1  --   --   --   --   --   --   EOSABS 0.0  --   --   --   --   --   --   BASOSABS 0.0  --   --   --   --   --   --    < > = values in this interval not displayed.    Chemistries  Recent Labs  Lab 01/26/20 0427 01/26/20 0427 01/27/20 0436 01/28/20 0523 01/29/20 0244 01/30/20 0611 01/31/20 0643  NA 136   < > 139 135 138 139 138  K 5.6*   < > 3.4* 3.8 3.9 3.8 4.0  CL 97*   < > 90* 92* 93* 93* 93*  CO2 29   < > 38* 34* 37* 34* 36*  GLUCOSE 129*   < > 124* 92 96 99 96  BUN 32*   < > 27* 33* 30* 23 23  CREATININE 0.86   < > 0.83 0.90 0.75 0.70 0.68  CALCIUM 8.8*   < > 8.8* 8.5* 8.4* 8.4* 8.3*  MG 1.7   < > 1.7 2.0 1.9 1.7 1.7  AST 411*  --   271* 141* 51* 24  --   ALT 311*  --  286* 225* 138* 92*  --   ALKPHOS 122  --  101 93 79 80  --   BILITOT 1.0  --  0.9 1.2 1.1 1.2  --    < > = values in this interval not displayed.   ------------------------------------------------------------------------------------------------------------------ No results for input(s): CHOL, HDL, LDLCALC, TRIG, CHOLHDL, LDLDIRECT in the last 72 hours.  No results found for: HGBA1C ------------------------------------------------------------------------------------------------------------------ No results for input(s): TSH, T4TOTAL, T3FREE, THYROIDAB in the last 72 hours.  Invalid input(s): FREET3 ------------------------------------------------------------------------------------------------------------------ No results for input(s): VITAMINB12, FOLATE, FERRITIN, TIBC, IRON, RETICCTPCT in the last 72 hours.  Coagulation profile Recent Labs  Lab 01/25/20 0311  INR 1.5*    No results for input(s): DDIMER in the last 72 hours.  Cardiac Enzymes No results for input(s): CKMB, TROPONINI, MYOGLOBIN in the last 168 hours.  Invalid input(s): CK ------------------------------------------------------------------------------------------------------------------    Component Value Date/Time   BNP 1,062.7 (H) 01/25/2020 1610    Micro Results Recent Results (from the past 240 hour(s))  Culture, blood (Routine x 2)     Status: None   Collection Time: 01/25/20  3:00 AM   Specimen: BLOOD  Result Value Ref Range Status   Specimen Description BLOOD RIGHT ARM  Final   Special Requests   Final    BOTTLES DRAWN AEROBIC AND ANAEROBIC Blood Culture results may not be optimal due to an excessive volume of blood received in culture bottles   Culture   Final    NO GROWTH 5 DAYS Performed at [facility], [address] [place], [place] [address]    Report Status 01/30/2020 FINAL  Final  Culture, blood (Routine x 2)     Status: None   Collection Time:  01/25/20  3:10 AM   Specimen: BLOOD  Result Value Ref Range Status   Specimen Description BLOOD RIGHT HAND  Final   Special Requests   Final    BOTTLES DRAWN AEROBIC ONLY Blood Culture adequate volume   Culture   Final    NO GROWTH 5 DAYS Performed at [facility], [address], [place], [place] [address]    Report Status 01/30/2020 FINAL  Final  SARS Coronavirus 2 by RT PCR (hospital order, performed in [facility] hospital lab) Nasopharyngeal Nasopharyngeal Swab     Status: None   Collection Time: 01/25/20  3:26 AM   Specimen: Nasopharyngeal Swab  Result Value Ref Range Status   SARS Coronavirus 2 NEGATIVE NEGATIVE Final    Comment: (NOTE) SARS-CoV-2 target nucleic acids are NOT DETECTED.  The SARS-CoV-2 RNA is generally detectable in upper and lower respiratory specimens during the acute phase of infection. The lowest concentration of SARS-CoV-2 viral copies this assay can detect is 250 copies / mL. A negative result does not preclude SARS-CoV-2 infection and should not be used as the sole basis for treatment or other patient management decisions.  A negative result may occur with improper specimen collection / handling, submission of specimen other than nasopharyngeal swab, presence of viral mutation(s) within the areas targeted by this assay, and inadequate number of viral copies (<250 copies / mL). A negative result must be combined with clinical observations, patient history, and epidemiological information.  Fact Sheet for Patients:   BoilerBrush.com.cy  Fact Sheet for Healthcare Providers: https://pope.com/  This test is not yet  approved or  cleared by the [place] and has been authorized for detection and/or diagnosis of SARS-CoV-2 by FDA under an Emergency Use Authorization (EUA).  This EUA will remain in effect (meaning this test can be used) for the duration of the COVID-19 declaration under Section  564(b)(1) of the Act, 21 U.S.C. section 360bbb-3(b)(1), unless the authorization is terminated or revoked sooner.  Performed at [facility], [address], [place], [place] [address]   MRSA PCR Screening     Status: Abnormal   Collection Time: 01/28/20 12:31 AM   Specimen: Nasopharyngeal  Result Value Ref Range Status   MRSA by PCR POSITIVE (A) NEGATIVE Final    Comment:        The GeneXpert MRSA Assay (FDA approved for NASAL specimens only), is one component of a comprehensive MRSA colonization surveillance program. It is not intended to diagnose MRSA infection nor to guide or monitor treatment for MRSA infections. RESULT CALLED TO, READ BACK BY AND VERIFIED WITH: A ROBERTS RN 01/28/20 0407 JDW Performed at [facility], [address] [place], [place] [address]     Radiology Reports US Abdomen Limited  Result Date: 01/25/2020 CLINICAL DATA:  74 year old female with elevated LFTs. EXAM: ULTRASOUND ABDOMEN LIMITED RIGHT UPPER QUADRANT COMPARISON:  Right upper quadrant ultrasound dated 02/14/2006. FINDINGS: Gallbladder: Cholecystectomy. Common bile duct: Diameter: 4 mm Liver: There is diffuse increased liver echogenicity most commonly seen in the setting of fatty infiltration. Superimposed inflammation or fibrosis is not excluded. Clinical correlation is recommended. Portal vein is patent on color Doppler imaging with normal direction of blood flow towards the liver. Other: There is a partially visualized and incompletely evaluated 2 cm right renal upper pole cyst. IMPRESSION: 1. Cholecystectomy. 2. Fatty liver. 3. Patent main portal vein with hepatopetal flow. Electronically Signed   By: Elgie Collard M.D.   On: 01/25/2020 18:14   DG CHEST PORT 1 VIEW  Result Date: 01/30/2020 CLINICAL DATA:  Sirs.  Rule new edema. EXAM: PORTABLE CHEST 1 VIEW COMPARISON:  January 26, 2020 FINDINGS: The heart size is borderline to mildly enlarged. The hila are unchanged. The  mediastinum is normal. Peripheral reticular opacities are chronic consistent with interstitial lung disease. Increased interstitial a bat opacities bilaterally. No other interval changes. IMPRESSION: 1. Chronic peripheral reticulations consistent with interstitial lung disease. 2. Increased interstitial opacities more diffusely represents a superimposed process which could represent atypical infection or edema. Recommend clinical correlation and follow-up imaging to ensure resolution. Electronically Signed   By: Gerome Sam III M.D   On: 01/30/2020 17:05   DG Chest Port 1 View  Result Date: 01/26/2020 CLINICAL DATA:  Shortness of breath EXAM: PORTABLE CHEST 1 VIEW COMPARISON:  01/25/2020 FINDINGS: Bilateral chronic interstitial thickening. Bilateral emphysematous changes. No focal consolidation. No pleural effusion or pneumothorax. Heart and mediastinal contours are unremarkable. No acute osseous abnormality. IMPRESSION: No active disease. Electronically Signed   By: Elige Ko   On: 01/26/2020 16:23   DG Chest Portable 1 View  Result Date: 01/25/2020 CLINICAL DATA:  Shortness of breath. EXAM: PORTABLE CHEST 1 VIEW COMPARISON:  10/13/2019, CT 08/07/2018 FINDINGS: Mild cardiomegaly. Unchanged mediastinal contours. Emphysema with interstitial coarsening. Bibasilar reticulation consistent with honeycombing. No acute or confluent airspace disease. No pleural effusion or pneumothorax. Bones are under mineralized. No acute osseous abnormalities are seen. IMPRESSION: 1. Cardiomegaly. 2. Emphysema.  Basilar honeycombing/fibrosis. Electronically Signed   By: Narda Rutherford M.D.   On: 01/25/2020 03:10   DG  Abd Portable 1V  Result Date: 01/30/2020 CLINICAL DATA:  Sirs.  Rule out new edema. EXAM: PORTABLE ABDOMEN - 1 VIEW COMPARISON:  March 17, 2009 FINDINGS: Contrast is seen throughout the length of the normal caliber colon. No bowel obstruction. No acute abnormalities are identified. No free air free  fluid. IMPRESSION: No acute abnormalities.  Contrast throughout the colon. Electronically Signed   By: Gerome Sam III M.D   On: 01/30/2020 17:05   ECHOCARDIOGRAM COMPLETE  Result Date: 01/26/2020    ECHOCARDIOGRAM REPORT   Patient Name:   KENYATTE GRUBER Arceneaux Date of Exam: 01/26/2020 Medical Rec #:  [id number]          Height:       66.0 in Accession #:    [id number]         Weight:       112.0 lb Date of Birth:  1946/05/27          BSA:          1.563 m Patient Age:    73 years           BP:           122/69 mmHg Patient Gender: F                  HR:           105 bpm. Exam Location:  Inpatient Procedure: 2D Echo Indications:    cardiomyopathy  History:        Patient has prior history of Echocardiogram examinations, most                 recent 10/07/2018. COPD; Risk Factors:Hypertension and                 Dyslipidemia.  Sonographer:    Celene Skeen RDCS (AE) Referring Phys: [phone] [facility] A SMITH  Sonographer Comments: restricted mobility - patient unable to turn on left side. additionally, head of the patient unable to lower significantly, which made imaging challenging IMPRESSIONS  1. Marded right sided heart failure.  2. Left ventricular ejection fraction, by estimation, is 45 to 50%. The left ventricle has mildly decreased function. The left ventricle demonstrates global hypokinesis. Left ventricular diastolic parameters were normal.  3. Right ventricular systolic function is severely reduced. The right ventricular size is severely enlarged. There is severely elevated pulmonary artery systolic pressure.  4. Left atrial size was mildly dilated.  5. Right atrial size was severely dilated.  6. The mitral valve is abnormal. Mild mitral valve regurgitation. No evidence of mitral stenosis.  7. Tricuspid valve regurgitation is severe.  8. The aortic valve is tricuspid. Aortic valve regurgitation is mild. Mild to moderate aortic valve sclerosis/calcification is present, without any evidence of aortic stenosis.   9. The inferior vena cava is normal in size with greater than 50% respiratory variability, suggesting right atrial pressure of 3 mmHg. FINDINGS  Left Ventricle: Left ventricular ejection fraction, by estimation, is 45 to 50%. The left ventricle has mildly decreased function. The left ventricle demonstrates global hypokinesis. The left ventricular internal cavity size was normal in size. There is  no left ventricular hypertrophy. Left ventricular diastolic parameters were normal. Right Ventricle: The right ventricular size is severely enlarged. Right vetricular wall thickness was not assessed. Right ventricular systolic function is severely reduced. There is severely elevated pulmonary artery systolic pressure. The tricuspid regurgitant velocity is 3.91 m/s, and with an assumed right atrial pressure of 10 mmHg, the estimated right  ventricular systolic pressure is 71.2 mmHg. Left Atrium: Left atrial size was mildly dilated. Right Atrium: Right atrial size was severely dilated. Pericardium: There is no evidence of pericardial effusion. Mitral Valve: The mitral valve is abnormal. There is moderate thickening of the mitral valve leaflet(s). There is moderate calcification of the mitral valve leaflet(s). Normal mobility of the mitral valve leaflets. Moderate mitral annular calcification. Mild mitral valve regurgitation. No evidence of mitral valve stenosis. Tricuspid Valve: The tricuspid valve is normal in structure. Tricuspid valve regurgitation is severe. No evidence of tricuspid stenosis. Aortic Valve: The aortic valve is tricuspid. Aortic valve regurgitation is mild. Mild to moderate aortic valve sclerosis/calcification is present, without any evidence of aortic stenosis. Pulmonic Valve: The pulmonic valve was normal in structure. Pulmonic valve regurgitation is not visualized. No evidence of pulmonic stenosis. Aorta: The aortic root is normal in size and structure. Venous: The inferior vena cava is normal in size  with greater than 50% respiratory variability, suggesting right atrial pressure of 3 mmHg. IAS/Shunts: No atrial level shunt detected by color flow Doppler. Additional Comments: Marded right sided heart failure.  LEFT VENTRICLE PLAX 2D LVIDd:         4.20 cm LVIDs:         2.80 cm LV PW:         1.40 cm LV IVS:        1.00 cm LVOT diam:     2.00 cm LV SV:         35 LV SV Index:   23 LVOT Area:     3.14 cm  LEFT ATRIUM         Index      RIGHT ATRIUM           Index LA diam:    4.00 cm 2.56 cm/m RA Area:     25.90 cm                                RA Volume:   96.80 ml  61.93 ml/m  AORTIC VALVE LVOT Vmax:   73.10 cm/s LVOT Vmean:  43.500 cm/s LVOT VTI:    0.113 m TRICUSPID VALVE TR Peak grad:   61.2 mmHg TR Vmax:        391.00 cm/s  SHUNTS Systemic VTI:  0.11 m Systemic Diam: 2.00 cm Charlton Haws MD Electronically signed by Charlton Haws MD Signature Date/Time: 01/26/2020/10:51:37 AM    Final    DG ESOPHAGUS W SINGLE CM (SOL OR THIN BA)  Result Date: 01/27/2020 CLINICAL DATA:  74 year old female inpatient with COPD and dementia with reported dysphagia and failure to thrive. EXAM: ESOPHOGRAM/BARIUM SWALLOW TECHNIQUE: Single contrast examination was performed using  thin barium. FLUOROSCOPY TIME:  Fluoroscopy Time:  1 minutes 18 seconds Number of Acquired Spot Images: 1 COMPARISON:  08/07/2026 chest CT. FINDINGS: Examination significantly limited by patient mental status and mobility limitations. Examination performed with the patient in the left lateral decubitus position with slight head elevation. No laryngeal penetration or tracheobronchial aspiration observed. Small to moderate hiatal hernia. Unable to assess for gastroesophageal reflux. No gross esophageal mass or ulcer. A small traction diverticulum is noted along the right midthoracic esophagus. There is a suggestion of a short segment of mild smooth narrowing in the lower thoracic esophagus at the esophagogastric junction. Otherwise normal esophageal  distensibility. Suggestion of moderate esophageal dysmotility with proximal escape and tertiary contractions. Barium tablet not administered due  to above patient limitations. IMPRESSION: 1. Significantly limited study, see comments. 2. Small to moderate hiatal hernia. 3. Suggestion of a short segment of mild smooth narrowing in the lower thoracic esophagus at the esophagogastric junction, cannot exclude a mild peptic stricture, see comments. No gross esophageal mass. 4. 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 5. Small traction diverticulum along the right mid thoracic esophagus. 6. No laryngeal penetration or tracheobronchial aspiration observed. Electronically Signed   By: Delbert Phenix M.D.   On: 01/27/2020 11:36     Time Spent in minutes  30     Laverna Peace M.D on 01/31/2020 at 10:59 AM  To page go to [email or web address] - password [facility]

## 2020-01-31 NOTE — Plan of Care (Signed)
?  Problem: Activity: ?Goal: Capacity to carry out activities will improve ?Outcome: Progressing ?  ?Problem: Cardiac: ?Goal: Ability to achieve and maintain adequate cardiopulmonary perfusion will improve ?Outcome: Progressing ?  ?Problem: Education: ?Goal: Knowledge of General Education information will improve ?Description: Including pain rating scale, medication(s)/side effects and non-pharmacologic comfort measures ?Outcome: Progressing ?  ?Problem: Clinical Measurements: ?Goal: Respiratory complications will improve ?Outcome: Progressing ?  ?

## 2020-02-01 ENCOUNTER — Inpatient Hospital Stay (HOSPITAL_COMMUNITY): Payer: Medicare Other

## 2020-02-01 LAB — SARS CORONAVIRUS 2 (TAT 6-24 HRS): SARS Coronavirus 2: NEGATIVE

## 2020-02-01 MED ORDER — PREDNISONE 20 MG PO TABS
40.0000 mg | ORAL_TABLET | Freq: Every day | ORAL | Status: AC
  Administered 2020-02-01: 40 mg via ORAL
  Filled 2020-02-01: qty 2

## 2020-02-01 NOTE — Consult Note (Signed)
WOC Nurse Consult Note: Patient receiving care in [facility] 3E12. 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  Patient states it has been present for quite a long time, that it gets better, then gets worse.  Pt states it is not painful. Pressure Injury POA: Yes Measurement: 1 cm x 0.8 cm x 0.2 cm with 0.8 cm undermining at 12 'oclock Wound bed: moist, pink Drainage (amount, consistency, odor) tan on existing foam dressing Periwound: intact, epibole present to wound edges Dressing procedure/placement/frequency: Wash coccyx wound with soap and water. Pat dry. Place a small piece of Aquacel [place] [phone]) over the wound. Be sure to tuck it up into the undermining at the top of the wound. Cover with a foam dressing.  Change daily. Monitor the wound area(s) for worsening of condition such as: Signs/symptoms of infection,  Increase in size,  Development of or worsening of odor, Development of pain, or increased pain at the affected locations.  Notify the medical team if any of these develop.  Thank you for the consult.  Discussed plan of care with the patient.  WOC nurse will not follow at this time.  Please re-consult the WOC team if needed.  Helmut Muster, RN, MSN, CWOCN, CNS-BC, pager [phone]

## 2020-02-01 NOTE — Progress Notes (Signed)
[facility]  PROGRESS NOTE  [name] VQQ:[id number] DOB: March 28, 1946 DOA: 01/25/2020 PCP: Blane Ohara, MD Admit date - 01/25/2020   Admitting Physician Clydie Braun, MD  Outpatient Primary MD for the patient is [name], [name], MD  LOS - 7 Brief Narrative   Whitney Steele is a 74 y.o. year old female with medical history significant for chronic hypoxic respiratory failure on 3 L secondary to COPD/pulmonary fibrosis, RA, hypothyroidism, vascular dementia, HTN, HLD, dysphagia secondary to history of esophageal stricture who presented on 01/25/2020 with worsening shortness of breath at her facility and found to have O2 saturations in the mid 70s per EMS requiring nonrebreather to improve sats to 90%  Hospital course complicated requiring BiPAP after initial ABG revealed pH of 7.25 and PCO2 of 73 with PO2 of 84 in the ED.  Also noted to have elevated AST of 698, ALT of 289, BNP greater than 1000 with chest x-ray showing cardiomegaly emphysema and pulmonary fibrosis.  PCCM was consulted on admission.  Patient is able to transition down to nasal cannula without recurrent need of BiPAP.  Underwent esophagram which showed concern for potential peptic stricture and presbyesophagus.  Additionally TTE showed RVSP greater than 70 prompting cardiology consultation for further assistance.  Barium esophagram consistent with presbyesophagus as well as possible area of stricture. Underwent EGD with dilation with GI on 8/20.  Subjective  This morning patient had no complaints except for occasional pain at sight of sacral pressure injury (POA). I spoke to patient and her daughter via patient's bedside phone for plan for likely discharge today.   Later this afternoon patient had a choking episode witnessed by nursing staff after eating some of her sandwich and turned blue per report. She was increased from her baseline O2 of 3-4L to NRB 12 L.   I assessed patient shortly afterwards and she reports  she swallowed her sandwich to quickly before the episode. She denies any current chest pain, pain with swallowing, or shortness of breath. She is resting comfortably with normal oxygen saturation and respiratory effort on 12 L NRB mask  A & P    Acute on chronic hypoxic/hypercapnic respiratory failure presumed secondary to combined COPD/CHF exacerbation in the setting of pulmonary hypertension, improving.   Patient was on baseline O2 this am of 2-3 L via [place] with normal oxygen saturation and CXR this am showed improvement in opacities and pulmonary edema,  but then  She had a witnessed choking episode on her lunchthat led to increased O2 requirements. Has not required BiPAP since admission day.   -completed prednisone burs ( days), nebs PRN -BiPAP nightly and as needed,  monitor in progressive unit -Monitor response to oral Lasix (given weekly per cardiology recommendation) -Encourage incentive spirometer, flutter valve -Goal SPO2 greater than 88%, continue to wean O2 as able  Severely reduced systolic function with RVSP of 71.  Suspect related to chronic pulmonary hypertension in setting of known chronic emphysema/pulmonary fibrosis.  Did present with elevated BNP and cardiomegaly but clinically no overt signs of volume overload. Repeat cxr shows resolution of pulmonary edema -Cardiology recommends weekly dosing of Lasix, also added low-dose digoxin this admission -Daily weights, strict intake/output   Elevated AST/ALT, improving.  On admission AST peaked of a 733 ALT of 345, currently downtrending.  Suspect related to hepatic congestion from right-sided heart failure.  Hepatitis panel unremarkable, right upper quadrant ultrasound showed fatty liver -monitor CMP  Upper abdominal pain, resolved.    LFTs continue to downtrend,  right upper quadrant ultrasound shows no acute pathology.  Having BMs. Lipase wnl. 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 S/pg EGD and dilation by GI on 8/20. Also found to have large hiatal hernia.  Tolerating diet. She did have witnessed choking episode on 8/24, denies any dysphagia or odynophagia -PPI for hiatal hernia --monitor on soft diet  AKI with hyperkalemia, resolved.  Peak creatinine of 1.3 on admission potassium 6.1, improved with Lokelma and IV fluids, suspect prerenal -Monitor BMP, avoid nephrotoxins, monitor output  Unwitnessed fall from bed.  No fractures on imaging.  CK unremarkable.  Nursing facility did not report any fall.  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  Alert and oriented x to person, place, intermittently to time following commands -Delirium precautions  -continue home Cymbalta, Xanax  HLD -Discontinued home Lipitor given elevated LFTs per cardiology  Pain -Continue home Neurontin for   Seizure history -Continue home Keppra -Seizure precautions  SIRS Criteria, resolved. TSH wnl.  Abdominal x-ray was unremarkable, repeat chest x-ray mention bilateral opacities concerning for potential edema that has now resolved --blood cultures if becomes febrile  Stage 3 Pressure injury, coccyx, poa. Slight drainage,no current signs of infection,  - appreciate wound recs: wash with soap and water, pat dry, place small piece of Aquacel [place] [phone]) over the wound. Be sure to tuck it up into the undermining at the top of the wound. Cover with a foam dressing.  Change daily    Family Communication  : None, will update daughter [name]  Code Status : DNR, discussed on day of admission  Disposition Plan  :  Patient is from skilled nursing facility. Anticipated  d/c date: 1 to 2days. Barriers to d/c or necessity for inpatient status:  Ensure breathing status improves and able to wean back to home O2 and no repeat choking episodes, anticipate discharge back to facility in 24-48 hours , Consults  : GI, PCCM, cardiology  Procedures  :   TTE, 8/18;   Barium esophagram 8/19  EGD 8/20 Tortuous esophagus. - Low-grade of narrowing Schatzki ring. Dilated. - Large hiatal hernia. - No gross lesions in the stomach. - Normal examined duodenum. - No specimens collected.  DVT Prophylaxis  :  Lovenox   Lab Results  Component Value Date   PLT 161 01/31/2020    Diet :  Diet Order            DIET SOFT Room service appropriate? Yes; Fluid consistency: Thin  Diet effective now                  Inpatient Medications Scheduled Meds: . ALPRAZolam  0.25 mg Oral BID  . aspirin EC  81 mg Oral Daily  . Chlorhexidine Gluconate Cloth  6 each Topical Q0600  . digoxin  0.125 mg Oral Q M,W,F  . diltiazem  30 mg Oral TID  . docusate sodium  100 mg Oral BID  . DULoxetine  60 mg Oral Daily  . enoxaparin (LOVENOX) injection  40 mg Subcutaneous Q24H  . ferrous sulfate  325 mg Oral Q breakfast  . folic acid  1 mg Oral Daily  . furosemide  20 mg Oral Q Mon  . gabapentin  100 mg Oral  BID  . levETIRAcetam  500 mg Oral BID  . mupirocin ointment  1 application Nasal BID  . pantoprazole  40 mg Oral BID  . phosphorus  250 mg Oral QID  . polyethylene glycol  17 g Oral Daily  . potassium chloride  10 mEq Oral Q Mon  . sodium chloride flush  3 mL Intravenous Q12H  . thiamine  100 mg Oral Daily   Continuous Infusions: . sodium chloride 250 mL (01/27/20 0914)   PRN Meds:.sodium chloride, albuterol, ipratropium-albuterol, lip balm, LORazepam, morphine injection, ondansetron **OR** ondansetron (ZOFRAN) IV, oxyCODONE  Antibiotics  :   Anti-infectives (From admission, onward)   None       Objective   Vitals:   02/01/20 1119 02/01/20 1227 02/01/20 1329  02/01/20 1340  BP: 128/86 118/76    Pulse: 97 (!) 103 93 95  Resp: 18     Temp: 98.4 F (36.9 C)     TempSrc: Oral     SpO2: 91% 94% 98% 92%  Weight:      Height:        SpO2: 92 % O2 Flow Rate (L/min): 12 L/min FiO2 (%): 35 %  Wt Readings from Last 3 Encounters:  02/01/20 53.5 kg  01/13/20 50.8 kg  11/02/19 54.4 kg     Intake/Output Summary (Last 24 hours) at 02/01/2020 1405 Last data filed at 02/01/2020 0900 Gross per 24 hour  Intake 680 ml  Output 250 ml  Net 430 ml    Physical Exam:     Awake Alert, oriented to self, place, context, not to time, normal affect Right hand grip decreased strength (chronic with arthritic changes in bilateral hands from RA) Diffuse crackles in all lung fields, normal respiratory effort on 12 L NRB, no wheezing Left AKA Bruising around mouth,  Regular rate and rhythm, SEM appreciated, no edema Abdomen soft, nondistended, non-tender, normal bowel sounds    I have personally reviewed the following:   Data Reviewed:  CBC Recent Labs  Lab 01/27/20 0436 01/28/20 0523 01/29/20 0244 01/30/20 0611 01/31/20 0643  WBC 9.3 11.0* 10.5 14.7* 12.4*  HGB 14.2 14.5 14.3 14.6 14.0  HCT 45.7 47.7* 47.0* 47.8* 46.3*  PLT 231 200 167 171 161  MCV 93.5 93.3 94.0 94.5 94.3  MCH 29.0 28.4 28.6 28.9 28.5  MCHC 31.1 30.4 30.4 30.5 30.2  RDW 15.8* 15.6* 15.8* 15.4 15.6*    Chemistries  Recent Labs  Lab 01/26/20 0427 01/26/20 0427 01/27/20 0436 01/28/20 0523 01/29/20 0244 01/30/20 0611 01/31/20 0643  NA 136   < > 139 135 138 139 138  K 5.6*   < > 3.4* 3.8 3.9 3.8 4.0  CL 97*   < > 90* 92* 93* 93* 93*  CO2 29   < > 38* 34* 37* 34* 36*  GLUCOSE 129*   < > 124* 92 96 99 96  BUN 32*   < > 27* 33* 30* 23 23  CREATININE 0.86   < > 0.83 0.90 0.75 0.70 0.68  CALCIUM 8.8*   < > 8.8* 8.5* 8.4* 8.4* 8.3*  MG 1.7   < > 1.7 2.0 1.9 1.7 1.7  AST 411*  --  271* 141* 51* 24  --   ALT 311*  --  286* 225* 138* 92*  --   ALKPHOS 122  --  101 93  79 80  --   BILITOT 1.0  --  0.9 1.2 1.1 1.2  --    < > =  values in this interval not displayed.   ------------------------------------------------------------------------------------------------------------------ No results for input(s): CHOL, HDL, LDLCALC, TRIG, CHOLHDL, LDLDIRECT in the last 72 hours.  No results found for: HGBA1C ------------------------------------------------------------------------------------------------------------------ No results for input(s): TSH, T4TOTAL, T3FREE, THYROIDAB in the last 72 hours.  Invalid input(s): FREET3 ------------------------------------------------------------------------------------------------------------------ No results for input(s): VITAMINB12, FOLATE, FERRITIN, TIBC, IRON, RETICCTPCT in the last 72 hours.  Coagulation profile No results for input(s): INR, PROTIME in the last 168 hours.  No results for input(s): DDIMER in the last 72 hours.  Cardiac Enzymes No results for input(s): CKMB, TROPONINI, MYOGLOBIN in the last 168 hours.  Invalid input(s): CK ------------------------------------------------------------------------------------------------------------------    Component Value Date/Time   BNP 1,062.7 (H) 01/25/2020 9371    Micro Results Recent Results (from the past 240 hour(s))  Culture, blood (Routine x 2)     Status: None   Collection Time: 01/25/20  3:00 AM   Specimen: BLOOD  Result Value Ref Range Status   Specimen Description BLOOD RIGHT ARM  Final   Special Requests   Final    BOTTLES DRAWN AEROBIC AND ANAEROBIC Blood Culture results may not be optimal due to an excessive volume of blood received in culture bottles   Culture   Final    NO GROWTH 5 DAYS Performed at [facility], [address] [place], [place] [address]    Report Status 01/30/2020 FINAL  Final  Culture, blood (Routine x 2)     Status: None   Collection Time: 01/25/20  3:10 AM   Specimen: BLOOD  Result Value Ref Range Status    Specimen Description BLOOD RIGHT HAND  Final   Special Requests   Final    BOTTLES DRAWN AEROBIC ONLY Blood Culture adequate volume   Culture   Final    NO GROWTH 5 DAYS Performed at [facility], [address], [place], [place] [address]    Report Status 01/30/2020 FINAL  Final  SARS Coronavirus 2 by RT PCR (hospital order, performed in [facility] hospital lab) Nasopharyngeal Nasopharyngeal Swab     Status: None   Collection Time: 01/25/20  3:26 AM   Specimen: Nasopharyngeal Swab  Result Value Ref Range Status   SARS Coronavirus 2 NEGATIVE NEGATIVE Final    Comment: (NOTE) SARS-CoV-2 target nucleic acids are NOT DETECTED.  The SARS-CoV-2 RNA is generally detectable in upper and lower respiratory specimens during the acute phase of infection. The lowest concentration of SARS-CoV-2 viral copies this assay can detect is 250 copies / mL. A negative result does not preclude SARS-CoV-2 infection and should not be used as the sole basis for treatment or other patient management decisions.  A negative result may occur with improper specimen collection / handling, submission of specimen other than nasopharyngeal swab, presence of viral mutation(s) within the areas targeted by this assay, and inadequate number of viral copies (<250 copies / mL). A negative result must be combined with clinical observations, patient history, and epidemiological information.  Fact Sheet for Patients:   BoilerBrush.com.cy  Fact Sheet for Healthcare Providers: https://pope.com/  This test is not yet approved or  cleared by the [place] FDA and has been authorized for detection and/or diagnosis of SARS-CoV-2 by FDA under an Emergency Use Authorization (EUA).  This EUA will remain in effect (meaning this test can be used) for the duration of the COVID-19 declaration under Section 564(b)(1) of the Act, 21 U.S.C. section 360bbb-3(b)(1), unless the  authorization is terminated or revoked sooner.  Performed at  [facility], [address] [place], [place] [address]   MRSA PCR Screening     Status: Abnormal   Collection Time: 01/28/20 12:31 AM   Specimen: Nasopharyngeal  Result Value Ref Range Status   MRSA by PCR POSITIVE (A) NEGATIVE Final    Comment:        The GeneXpert MRSA Assay (FDA approved for NASAL specimens only), is one component of a comprehensive MRSA colonization surveillance program. It is not intended to diagnose MRSA infection nor to guide or monitor treatment for MRSA infections. RESULT CALLED TO, READ BACK BY AND VERIFIED WITH: A ROBERTS RN 01/28/20 0407 JDW Performed at [facility], [address] [place], [place] [address]     Radiology Reports US Abdomen Limited  Result Date: 01/25/2020 CLINICAL DATA:  74 year old female with elevated LFTs. EXAM: ULTRASOUND ABDOMEN LIMITED RIGHT UPPER QUADRANT COMPARISON:  Right upper quadrant ultrasound dated 02/14/2006. FINDINGS: Gallbladder: Cholecystectomy. Common bile duct: Diameter: 4 mm Liver: There is diffuse increased liver echogenicity most commonly seen in the setting of fatty infiltration. Superimposed inflammation or fibrosis is not excluded. Clinical correlation is recommended. Portal vein is patent on color Doppler imaging with normal direction of blood flow towards the liver. Other: There is a partially visualized and incompletely evaluated 2 cm right renal upper pole cyst. IMPRESSION: 1. Cholecystectomy. 2. Fatty liver. 3. Patent main portal vein with hepatopetal flow. Electronically Signed   By: Elgie Collard M.D.   On: 01/25/2020 18:14   DG CHEST PORT 1 VIEW  Result Date: 02/01/2020 CLINICAL DATA:  Shortness of breath EXAM: PORTABLE CHEST 1 VIEW COMPARISON:  01/30/2020 FINDINGS: Stable cardiomediastinal contours. Chronic fibrotic changes within the lung bases, right worse than left. Hyperexpanded lungs. No superimposed airspace opacity.  No pleural effusion or pneumothorax. IMPRESSION: Chronic fibrotic changes within the lung bases. No superimposed airspace opacity. Electronically Signed   By: Duanne Guess D.O.   On: 02/01/2020 08:32   DG CHEST PORT 1 VIEW  Result Date: 01/30/2020 CLINICAL DATA:  Sirs.  Rule new edema. EXAM: PORTABLE CHEST 1 VIEW COMPARISON:  January 26, 2020 FINDINGS: The heart size is borderline to mildly enlarged. The hila are unchanged. The mediastinum is normal. Peripheral reticular opacities are chronic consistent with interstitial lung disease. Increased interstitial a bat opacities bilaterally. No other interval changes. IMPRESSION: 1. Chronic peripheral reticulations consistent with interstitial lung disease. 2. Increased interstitial opacities more diffusely represents a superimposed process which could represent atypical infection or edema. Recommend clinical correlation and follow-up imaging to ensure resolution. Electronically Signed   By: Gerome Sam III M.D   On: 01/30/2020 17:05   DG Chest Port 1 View  Result Date: 01/26/2020 CLINICAL DATA:  Shortness of breath EXAM: PORTABLE CHEST 1 VIEW COMPARISON:  01/25/2020 FINDINGS: Bilateral chronic interstitial thickening. Bilateral emphysematous changes. No focal consolidation. No pleural effusion or pneumothorax. Heart and mediastinal contours are unremarkable. No acute osseous abnormality. IMPRESSION: No active disease. Electronically Signed   By: Elige Ko   On: 01/26/2020 16:23   DG Chest Portable 1 View  Result Date: 01/25/2020 CLINICAL DATA:  Shortness of breath. EXAM: PORTABLE CHEST 1 VIEW COMPARISON:  10/13/2019, CT 08/07/2018 FINDINGS: Mild cardiomegaly. Unchanged mediastinal contours. Emphysema with interstitial coarsening. Bibasilar reticulation consistent with honeycombing. No acute or confluent airspace disease. No pleural effusion or pneumothorax. Bones are under mineralized. No acute osseous abnormalities are seen. IMPRESSION: 1.  Cardiomegaly. 2. Emphysema.  Basilar honeycombing/fibrosis. Electronically Signed   By: Narda Rutherford  M.D.   On: 01/25/2020 03:10   DG Abd Portable 1V  Result Date: 01/30/2020 CLINICAL DATA:  Sirs.  Rule out new edema. EXAM: PORTABLE ABDOMEN - 1 VIEW COMPARISON:  March 17, 2009 FINDINGS: Contrast is seen throughout the length of the normal caliber colon. No bowel obstruction. No acute abnormalities are identified. No free air free fluid. IMPRESSION: No acute abnormalities.  Contrast throughout the colon. Electronically Signed   By: Gerome Sam III M.D   On: 01/30/2020 17:05   ECHOCARDIOGRAM COMPLETE  Result Date: 01/26/2020    ECHOCARDIOGRAM REPORT   Patient Name:   KAROLINA ZAMOR Chisum Date of Exam: 01/26/2020 Medical Rec #:  [id number]          Height:       66.0 in Accession #:    [id number]         Weight:       112.0 lb Date of Birth:  1946/02/03          BSA:          1.563 m Patient Age:    73 years           BP:           122/69 mmHg Patient Gender: F                  HR:           105 bpm. Exam Location:  Inpatient Procedure: 2D Echo Indications:    cardiomyopathy  History:        Patient has prior history of Echocardiogram examinations, most                 recent 10/07/2018. COPD; Risk Factors:Hypertension and                 Dyslipidemia.  Sonographer:    Celene Skeen RDCS (AE) Referring Phys: [phone] [facility] A SMITH  Sonographer Comments: restricted mobility - patient unable to turn on left side. additionally, head of the patient unable to lower significantly, which made imaging challenging IMPRESSIONS  1. Marded right sided heart failure.  2. Left ventricular ejection fraction, by estimation, is 45 to 50%. The left ventricle has mildly decreased function. The left ventricle demonstrates global hypokinesis. Left ventricular diastolic parameters were normal.  3. Right ventricular systolic function is severely reduced. The right ventricular size is severely enlarged. There is severely elevated  pulmonary artery systolic pressure.  4. Left atrial size was mildly dilated.  5. Right atrial size was severely dilated.  6. The mitral valve is abnormal. Mild mitral valve regurgitation. No evidence of mitral stenosis.  7. Tricuspid valve regurgitation is severe.  8. The aortic valve is tricuspid. Aortic valve regurgitation is mild. Mild to moderate aortic valve sclerosis/calcification is present, without any evidence of aortic stenosis.  9. The inferior vena cava is normal in size with greater than 50% respiratory variability, suggesting right atrial pressure of 3 mmHg. FINDINGS  Left Ventricle: Left ventricular ejection fraction, by estimation, is 45 to 50%. The left ventricle has mildly decreased function. The left ventricle demonstrates global hypokinesis. The left ventricular internal cavity size was normal in size. There is  no left ventricular hypertrophy. Left ventricular diastolic parameters were normal. Right Ventricle: The right ventricular size is severely enlarged. Right vetricular wall thickness was not assessed. Right ventricular systolic function is severely reduced. There is severely elevated pulmonary artery systolic pressure. The tricuspid regurgitant velocity is 3.91 m/s, and with an assumed right  atrial pressure of 10 mmHg, the estimated right ventricular systolic pressure is 71.2 mmHg. Left Atrium: Left atrial size was mildly dilated. Right Atrium: Right atrial size was severely dilated. Pericardium: There is no evidence of pericardial effusion. Mitral Valve: The mitral valve is abnormal. There is moderate thickening of the mitral valve leaflet(s). There is moderate calcification of the mitral valve leaflet(s). Normal mobility of the mitral valve leaflets. Moderate mitral annular calcification. Mild mitral valve regurgitation. No evidence of mitral valve stenosis. Tricuspid Valve: The tricuspid valve is normal in structure. Tricuspid valve regurgitation is severe. No evidence of tricuspid  stenosis. Aortic Valve: The aortic valve is tricuspid. Aortic valve regurgitation is mild. Mild to moderate aortic valve sclerosis/calcification is present, without any evidence of aortic stenosis. Pulmonic Valve: The pulmonic valve was normal in structure. Pulmonic valve regurgitation is not visualized. No evidence of pulmonic stenosis. Aorta: The aortic root is normal in size and structure. Venous: The inferior vena cava is normal in size with greater than 50% respiratory variability, suggesting right atrial pressure of 3 mmHg. IAS/Shunts: No atrial level shunt detected by color flow Doppler. Additional Comments: Marded right sided heart failure.  LEFT VENTRICLE PLAX 2D LVIDd:         4.20 cm LVIDs:         2.80 cm LV PW:         1.40 cm LV IVS:        1.00 cm LVOT diam:     2.00 cm LV SV:         35 LV SV Index:   23 LVOT Area:     3.14 cm  LEFT ATRIUM         Index      RIGHT ATRIUM           Index LA diam:    4.00 cm 2.56 cm/m RA Area:     25.90 cm                                RA Volume:   96.80 ml  61.93 ml/m  AORTIC VALVE LVOT Vmax:   73.10 cm/s LVOT Vmean:  43.500 cm/s LVOT VTI:    0.113 m TRICUSPID VALVE TR Peak grad:   61.2 mmHg TR Vmax:        391.00 cm/s  SHUNTS Systemic VTI:  0.11 m Systemic Diam: 2.00 cm Charlton Haws MD Electronically signed by Charlton Haws MD Signature Date/Time: 01/26/2020/10:51:37 AM    Final    DG ESOPHAGUS W SINGLE CM (SOL OR THIN BA)  Result Date: 01/27/2020 CLINICAL DATA:  74 year old female inpatient with COPD and dementia with reported dysphagia and failure to thrive. EXAM: ESOPHOGRAM/BARIUM SWALLOW TECHNIQUE: Single contrast examination was performed using  thin barium. FLUOROSCOPY TIME:  Fluoroscopy Time:  1 minutes 18 seconds Number of Acquired Spot Images: 1 COMPARISON:  08/07/2026 chest CT. FINDINGS: Examination significantly limited by patient mental status and mobility limitations. Examination performed with the patient in the left lateral decubitus position  with slight head elevation. No laryngeal penetration or tracheobronchial aspiration observed. Small to moderate hiatal hernia. Unable to assess for gastroesophageal reflux. No gross esophageal mass or ulcer. A small traction diverticulum is noted along the right midthoracic esophagus. There is a suggestion of a short segment of mild smooth narrowing in the lower thoracic esophagus at the esophagogastric junction. Otherwise normal esophageal distensibility. Suggestion of moderate esophageal dysmotility with proximal escape  and tertiary contractions. Barium tablet not administered due to above patient limitations. IMPRESSION: 1. Significantly limited study, see comments. 2. Small to moderate hiatal hernia. 3. Suggestion of a short segment of mild smooth narrowing in the lower thoracic esophagus at the esophagogastric junction, cannot exclude a mild peptic stricture, see comments. No gross esophageal mass. 4. 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 5. Small traction diverticulum along the right mid thoracic esophagus. 6. No laryngeal penetration or tracheobronchial aspiration observed. Electronically Signed   By: Delbert Phenix M.D.   On: 01/27/2020 11:36     Time Spent in minutes  30     Laverna Peace M.D on 02/01/2020 at 2:05 PM  To page go to [email or web address] - password [facility]

## 2020-02-01 NOTE — Progress Notes (Signed)
   02/01/20 1227  Vitals  BP 118/76  MAP (mmHg) 89  BP Method Automatic  Pulse Rate (!) 103  Pulse Rate Source Monitor  MEWS COLOR  MEWS Score Color Green  Oxygen Therapy  SpO2 94 %  O2 Device Non-rebreather Mask  O2 Flow Rate (L/min) 12 L/min  Patient Activity (if Appropriate) In bed  Pulse Oximetry Type Continuous  MEWS Score  MEWS Temp 0  MEWS Systolic 0  MEWS Pulse 1  MEWS RR 0  MEWS LOC 0  MEWS Score 1   - Got called in pt.'s room because pt. Feels that she is a small piece of sandwich  - Tried suctioning the pt but nothing came out -Page Dr. Caleb Popp and informed her about pt.'s current situation -Pt then started complaining of diff. To breathe so this RN placed pt on O2 12L NRB, pt started feeling a huge relief, vitals are wnl.  @ 1235- Pt. States that she feels a lot better now and can breathe better - No further interventions at this time

## 2020-02-01 NOTE — Progress Notes (Signed)
8/24: CSW called and spoke with Tammy at [facility] to let her know that pt will not dc today.

## 2020-02-01 NOTE — Telephone Encounter (Signed)
Agree with the plan Thanks for letting me know RG

## 2020-02-02 DIAGNOSIS — R933 Abnormal findings on diagnostic imaging of other parts of digestive tract: Secondary | ICD-10-CM

## 2020-02-02 NOTE — Progress Notes (Signed)
Pt is currently on  3L unlabored breathing tolerates well SPO2 94 pt has not been using Bipap. Give nurse RT's number if she needs bipap.

## 2020-02-02 NOTE — Progress Notes (Signed)
[facility] PROGRESS NOTE    Progress Note  DEVANN CRIBB  YQM:[id number] DOB: 05-26-1946 DOA: 01/25/2020 PCP: Blane Ohara, MD     Brief Narrative:   Whitney Steele is an 74 y.o. female past medical history of chronic respiratory failure with hypoxia on 3 L of oxygen due to COPD/pulmonary fibrosis, rheumatoid arthritis, hypothyroidism, vascular dementia essential hypertension dysphagia secondary to stricture diagnosed on 01/25/2020 comes in with worsening shortness of breath per EMS saturations were in the 70s requiring a nonrebreather.  Assessment/Plan:   Acute on chronic respiratory failure with hypoxia and hypercarbia probably secondary to combined COPD and acute diastolic heart failure in the setting of pulmonary hypertension: Patient with a baseline oxygen requirement of 2 to 3 L. Chest x-ray this a.m. show improved opacities, however she subsequently had a choking episode to acknowledge her her oxygen requirements higher, did not require BiPAP. Is complete her course of steroids. Continue to use BiPAP nightly. Continue Lasix (she schedule weekly per cardiology) and monitor electrolytes, keep strict I's and O's and daily weights. Continue incentive spirometry and flutter valve.  Acute on chronic systolic heart failure: This was started on IV Lasix and pulmonary edema resolved, will continue oral weekly dose of Lasix and digoxin. Continue daily weights strict I's and O's.  Elevated LFTs: Early due to heart failure improving continue to monitor as needed.  Upper abdominal pain: Now resolved.  Continue Zofran as needed.  Dysphagia secondary to Schatzki's ring: Status post EGD with dilation on 01/28/2020. Currently on a PPI tolerating her diet.  Acute kidney injury with hyperkalemia: Resolved with IV fluids and Lokelma suspect prerenal.  Unwitnessed fall from bed: Imaging showed no fracture.  Patient is wheelchair-bound.  Protein caloric  malnutrition: Continue to monitor electrolytes.  Essential hypertension: Continue home Cardizem.  Rheumatoid arthritis: Continue current medications weekly.  Vascular dementia with anxiety: Cymbalta and Xanax.  Hyperlipidemia: Discontinue Lipitor per cardiology due to elevated LFTs.  Pain: Continue Neurontin.  History of seizures: No seizure disorders, continue current home medications.  SIRS criteria: Resolved.  Stage III pressure sacral decubitus ulcer present on admission RN Pressure Injury Documentation: Pressure Injury 10/05/18 Stage I -  Intact skin with non-blanchable redness of a localized area usually over a bony prominence. (Active)  10/05/18   Location: Sacrum  Location Orientation:   Staging: Stage I -  Intact skin with non-blanchable redness of a localized area usually over a bony prominence.  Wound Description (Comments):   Present on Admission: Yes     Pressure Injury 01/26/20 Coccyx Medial Unstageable - Full thickness tissue loss in which the base of the injury is covered by slough (yellow, tan, gray, green or brown) and/or eschar (tan, brown or black) in the wound bed. (Active)  01/26/20 1530  Location: Coccyx  Location Orientation: Medial  Staging: Unstageable - Full thickness tissue loss in which the base of the injury is covered by slough (yellow, tan, gray, green or brown) and/or eschar (tan, brown or black) in the wound bed.  Wound Description (Comments):   Present on Admission:     Estimated body mass index is 19.21 kg/m as calculated from the following:   Height as of this encounter: 5\' 6"  (1.676 m).   Weight as of this encounter: 54 kg.   DVT prophylaxis: lovenox Family Communication:none Status is: Inpatient  Remains inpatient appropriate because:Hemodynamically unstable   Dispo: The patient is from: Home              Anticipated d/c  is to: SNF              Anticipated d/c date is: 1 day              Patient currently is not  medically stable to d/c.        Code Status:     Code Status Orders  (From admission, onward)         Start     Ordered   01/25/20 1400  Do not attempt resuscitation (DNR)  Continuous       Question Answer Comment  In the event of cardiac or respiratory ARREST Do not call a "code blue"   In the event of cardiac or respiratory ARREST Do not perform Intubation, CPR, defibrillation or ACLS   In the event of cardiac or respiratory ARREST Use medication by any route, position, wound care, and other measures to relive pain and suffering. May use oxygen, suction and manual treatment of airway obstruction as needed for comfort.      01/25/20 1403        Code Status History    Date Active Date Inactive Code Status Order ID Comments User Context   10/05/2018 2230 10/14/2018 2131 DNR [id number]  Eulah Pont, MD ED   Advance Care Planning Activity    Advance Directive Documentation     Most Recent Value  Type of Advance Directive Living will  Pre-existing out of facility DNR order (yellow form or pink MOST form) --  "MOST" Form in Place? --        IV Access:    Peripheral IV   Procedures and diagnostic studies:   DG CHEST PORT 1 VIEW  Result Date: 02/01/2020 CLINICAL DATA:  Shortness of breath EXAM: PORTABLE CHEST 1 VIEW COMPARISON:  01/30/2020 FINDINGS: Stable cardiomediastinal contours. Chronic fibrotic changes within the lung bases, right worse than left. Hyperexpanded lungs. No superimposed airspace opacity. No pleural effusion or pneumothorax. IMPRESSION: Chronic fibrotic changes within the lung bases. No superimposed airspace opacity. Electronically Signed   By: Duanne Guess D.O.   On: 02/01/2020 08:32     Medical Consultants:    None.  Anti-Infectives:   none  Subjective:    KINNLEY FILYAW she relates her breathing is not improved continues to have difficulty breathing.  Objective:    Vitals:   02/01/20 1329 02/01/20 1340 02/01/20 2004 02/02/20  0333  BP:   106/66 126/74  Pulse: 93 95 87 79  Resp: 20 19 18 15   Temp:   98.5 F (36.9 C) 97.6 F (36.4 C)  TempSrc:   Oral Oral  SpO2: 98% 92% (!) 87% 92%  Weight:    54 kg  Height:       SpO2: 92 % O2 Flow Rate (L/min): 3 L/min FiO2 (%): 35 %   Intake/Output Summary (Last 24 hours) at 02/02/2020 1056 Last data filed at 02/02/2020 0908 Gross per 24 hour  Intake 1020 ml  Output 300 ml  Net 720 ml   Filed Weights   01/31/20 0342 02/01/20 0043 02/02/20 0333  Weight: 52.2 kg 53.5 kg 54 kg    Exam: General exam: In no acute distress. Respiratory system: Good air movement and clear to auscultation. Cardiovascular system: S1 & S2 heard, RRR. No JVD. Gastrointestinal system: Abdomen is nondistended, soft and nontender.  Extremities: No pedal edema. Skin: No rashes, lesions or ulcers Psychiatry: Judgement and insight appear normal. Mood & affect appropriate.    Data Reviewed:    Labs:  Basic Metabolic Panel: Recent Labs  Lab 01/27/20 0436 01/27/20 0436 01/28/20 0523 01/28/20 0523 01/29/20 0244 01/29/20 0244 01/30/20 0611 01/31/20 0643  NA 139  --  135  --  138  --  139 138  K 3.4*   < > 3.8   < > 3.9   < > 3.8 4.0  CL 90*  --  92*  --  93*  --  93* 93*  CO2 38*  --  34*  --  37*  --  34* 36*  GLUCOSE 124*  --  92  --  96  --  99 96  BUN 27*  --  33*  --  30*  --  23 23  CREATININE 0.83  --  0.90  --  0.75  --  0.70 0.68  CALCIUM 8.8*  --  8.5*  --  8.4*  --  8.4* 8.3*  MG 1.7  --  2.0  --  1.9  --  1.7 1.7  PHOS 3.0  --  2.4*  --  2.7  --  1.9* 3.3   < > = values in this interval not displayed.   GFR Estimated Creatinine Clearance: 54.2 mL/min (by C-G formula based on SCr of 0.68 mg/dL). Liver Function Tests: Recent Labs  Lab 01/27/20 0436 01/28/20 0523 01/29/20 0244 01/30/20 0611  AST 271* 141* 51* 24  ALT 286* 225* 138* 92*  ALKPHOS 101 93 79 80  BILITOT 0.9 1.2 1.1 1.2  PROT 7.2 6.8 6.4* 6.4*  ALBUMIN 2.5* 2.6* 2.4* 2.4*   Recent Labs  Lab  01/28/20 0523  LIPASE 44   No results for input(s): AMMONIA in the last 168 hours. Coagulation profile No results for input(s): INR, PROTIME in the last 168 hours. COVID-19 Labs  No results for input(s): DDIMER, FERRITIN, LDH, CRP in the last 72 hours.  Lab Results  Component Value Date   SARSCOV2NAA NEGATIVE 02/01/2020   SARSCOV2NAA NEGATIVE 01/25/2020   SARSCOV2NAA NEGATIVE 10/13/2019   SARSCOV2NAA NOT DETECTED 10/12/2018    CBC: Recent Labs  Lab 01/27/20 0436 01/28/20 0523 01/29/20 0244 01/30/20 0611 01/31/20 0643  WBC 9.3 11.0* 10.5 14.7* 12.4*  HGB 14.2 14.5 14.3 14.6 14.0  HCT 45.7 47.7* 47.0* 47.8* 46.3*  MCV 93.5 93.3 94.0 94.5 94.3  PLT 231 200 167 171 161   Cardiac Enzymes: No results for input(s): CKTOTAL, CKMB, CKMBINDEX, TROPONINI in the last 168 hours. BNP (last 3 results) No results for input(s): PROBNP in the last 8760 hours. CBG: Recent Labs  Lab 01/29/20 1132 01/29/20 1739 01/29/20 2039 01/30/20 1245 01/30/20 1603  GLUCAP 169* 148* 166* 160* 213*   D-Dimer: No results for input(s): DDIMER in the last 72 hours. Hgb A1c: No results for input(s): HGBA1C in the last 72 hours. Lipid Profile: No results for input(s): CHOL, HDL, LDLCALC, TRIG, CHOLHDL, LDLDIRECT in the last 72 hours. Thyroid function studies: No results for input(s): TSH, T4TOTAL, T3FREE, THYROIDAB in the last 72 hours.  Invalid input(s): FREET3 Anemia work up: No results for input(s): VITAMINB12, FOLATE, FERRITIN, TIBC, IRON, RETICCTPCT in the last 72 hours. Sepsis Labs: Recent Labs  Lab 01/28/20 0523 01/29/20 0244 01/30/20 0611 01/31/20 0643  WBC 11.0* 10.5 14.7* 12.4*   Microbiology Recent Results (from the past 240 hour(s))  Culture, blood (Routine x 2)     Status: None   Collection Time: 01/25/20  3:00 AM   Specimen: BLOOD  Result Value Ref Range Status   Specimen Description BLOOD RIGHT ARM  Final   Special Requests   Final    BOTTLES DRAWN AEROBIC AND  ANAEROBIC Blood Culture results may not be optimal due to an excessive volume of blood received in culture bottles   Culture   Final    NO GROWTH 5 DAYS Performed at [facility], [address] [place], [place] [address]    Report Status 01/30/2020 FINAL  Final  Culture, blood (Routine x 2)     Status: None   Collection Time: 01/25/20  3:10 AM   Specimen: BLOOD  Result Value Ref Range Status   Specimen Description BLOOD RIGHT HAND  Final   Special Requests   Final    BOTTLES DRAWN AEROBIC ONLY Blood Culture adequate volume   Culture   Final    NO GROWTH 5 DAYS Performed at [facility], [address], [place], [place] [address]    Report Status 01/30/2020 FINAL  Final  SARS Coronavirus 2 by RT PCR (hospital order, performed in [facility] hospital lab) Nasopharyngeal Nasopharyngeal Swab     Status: None   Collection Time: 01/25/20  3:26 AM   Specimen: Nasopharyngeal Swab  Result Value Ref Range Status   SARS Coronavirus 2 NEGATIVE NEGATIVE Final    Comment: (NOTE) SARS-CoV-2 target nucleic acids are NOT DETECTED.  The SARS-CoV-2 RNA is generally detectable in upper and lower respiratory specimens during the acute phase of infection. The lowest concentration of SARS-CoV-2 viral copies this assay can detect is 250 copies / mL. A negative result does not preclude SARS-CoV-2 infection and should not be used as the sole basis for treatment or other patient management decisions.  A negative result may occur with improper specimen collection / handling, submission of specimen other than nasopharyngeal swab, presence of viral mutation(s) within the areas targeted by this assay, and inadequate number of viral copies (<250 copies / mL). A negative result must be combined with clinical observations, patient history, and epidemiological information.  Fact Sheet for Patients:   BoilerBrush.com.cy  Fact Sheet for Healthcare  Providers: https://pope.com/  This test is not yet approved or  cleared by the [place] FDA and has been authorized for detection and/or diagnosis of SARS-CoV-2 by FDA under an Emergency Use Authorization (EUA).  This EUA will remain in effect (meaning this test can be used) for the duration of the COVID-19 declaration under Section 564(b)(1) of the Act, 21 U.S.C. section 360bbb-3(b)(1), unless the authorization is terminated or revoked sooner.  Performed at [facility], [address] [place], [place] [address]   MRSA PCR Screening     Status: Abnormal   Collection Time: 01/28/20 12:31 AM   Specimen: Nasopharyngeal  Result Value Ref Range Status   MRSA by PCR POSITIVE (A) NEGATIVE Final    Comment:        The GeneXpert MRSA Assay (FDA approved for NASAL specimens only), is one component of a comprehensive MRSA colonization surveillance program. It is not intended to diagnose MRSA infection nor to guide or monitor treatment for MRSA infections. RESULT CALLED TO, READ BACK BY AND VERIFIED WITH: A ROBERTS RN 01/28/20 0407 JDW Performed at [facility], [address], [place], [place] [address]   SARS CORONAVIRUS 2 (TAT 6-24 HRS) Nasopharyngeal Nasopharyngeal Swab     Status: None   Collection Time: 02/01/20  1:38 PM   Specimen: Nasopharyngeal Swab  Result Value Ref Range Status   SARS Coronavirus 2 NEGATIVE NEGATIVE Final    Comment: (  NOTE) SARS-CoV-2 target nucleic acids are NOT DETECTED.  The SARS-CoV-2 RNA is generally detectable in upper and lower respiratory specimens during the acute phase of infection. Negative results do not preclude SARS-CoV-2 infection, do not rule out co-infections with other pathogens, and should not be used as the sole basis for treatment or other patient management decisions. Negative results must be combined with clinical observations, patient history, and epidemiological information. The  expected result is Negative.  Fact Sheet for Patients: HairSlick.no  Fact Sheet for Healthcare Providers: quierodirigir.com  This test is not yet approved or cleared by the [place] FDA and  has been authorized for detection and/or diagnosis of SARS-CoV-2 by FDA under an Emergency Use Authorization (EUA). This EUA will remain  in effect (meaning this test can be used) for the duration of the COVID-19 declaration under Se ction 564(b)(1) of the Act, 21 U.S.C. section 360bbb-3(b)(1), unless the authorization is terminated or revoked sooner.  Performed at [facility], [address], [place], [place] [address]      Medications:   . ALPRAZolam  0.25 mg Oral BID  . aspirin EC  81 mg Oral Daily  . digoxin  0.125 mg Oral Q M,W,F  . diltiazem  30 mg Oral TID  . docusate sodium  100 mg Oral BID  . DULoxetine  60 mg Oral Daily  . enoxaparin (LOVENOX) injection  40 mg Subcutaneous Q24H  . ferrous sulfate  325 mg Oral Q breakfast  . folic acid  1 mg Oral Daily  . furosemide  20 mg Oral Q Mon  . gabapentin  100 mg Oral BID  . levETIRAcetam  500 mg Oral BID  . pantoprazole  40 mg Oral BID  . phosphorus  250 mg Oral QID  . polyethylene glycol  17 g Oral Daily  . potassium chloride  10 mEq Oral Q Mon  . sodium chloride flush  3 mL Intravenous Q12H  . thiamine  100 mg Oral Daily   Continuous Infusions: . sodium chloride 250 mL (01/27/20 0914)      LOS: 8 days   Marinda Elk  [facility]  02/02/2020, 10:56 AM

## 2020-02-03 MED ORDER — POTASSIUM CHLORIDE ER 10 MEQ PO TBCR: 10.0000 meq | EXTENDED_RELEASE_TABLET | ORAL | Status: AC

## 2020-02-03 MED ORDER — DIGOXIN 125 MCG PO TABS: 0.1250 mg | ORAL_TABLET | ORAL | Status: AC

## 2020-02-03 MED ORDER — FUROSEMIDE 20 MG PO TABS: 20.0000 mg | ORAL_TABLET | ORAL | 0 refills | Status: AC

## 2020-02-03 MED ORDER — ALPRAZOLAM 0.25 MG PO TABS: 0.2500 mg | ORAL_TABLET | Freq: Two times a day (BID) | ORAL | 0 refills | Status: AC

## 2020-02-03 MED ORDER — OXYCODONE-ACETAMINOPHEN 5-325 MG PO TABS
1.0000 | ORAL_TABLET | Freq: Four times a day (QID) | ORAL | 0 refills | Status: AC | PRN
Start: 2020-02-03 — End: ?

## 2020-02-03 NOTE — Care Management Important Message (Signed)
Important Message  Patient Details  Name: Whitney Steele MRN: [id number] Date of Birth: 1946/01/08   Medicare Important Message Given:  Yes     Renie Ora 02/03/2020, 10:19 AM

## 2020-02-03 NOTE — TOC Transition Note (Signed)
Transition of Care [facility]) - CM/SW Discharge Note   Patient Details  Name: Whitney Steele MRN: [id number] Date of Birth: 01-01-1946  [facility]) CM/SW Contact:  Carmina Miller, LCSWA Phone Number: 02/03/2020, 2:21 PM   Clinical Narrative:    Patient will DC to: [facility] Anticipated DC date: 02/03/20 Family notified: Yes, Kimber Relic Transport by: Sharin Mons   Per MD patient ready for DC to . RN to call report prior to discharge [phone]). RN, patient, patient's family, and facility notified of DC. Discharge Summary and FL2 sent to facility. DC packet on chart. Ambulance transport requested for patient.   CSW will sign off for now as social work intervention is no longer needed. Please consult [place] again if new needs arise.  Final next level of care: Skilled Nursing Facility Barriers to Discharge: Barriers Resolved   Patient Goals and CMS Choice Patient states their goals for this hospitalization and ongoing recovery are:: get better soon CMS Medicare.gov Compare Post Acute Care list provided to:: Patient Choice offered to / list presented to : Patient  Discharge Placement PASRR number recieved: 02/03/20            Patient chooses bed at:  ([facility]) Patient to be transferred to facility by: [facility] Name of family member notified: Kimber Relic [phone] Patient and family notified of of transfer: 02/03/20  Discharge Plan and Services In-house Referral: Clinical Social Work   Post Acute Care Choice: Skilled Nursing Facility                               Social Determinants of Health (SDOH) Interventions     Readmission Risk Interventions Readmission Risk Prevention Plan 10/12/2018  Transportation Screening Complete  PCP or Specialist Appt within 5-7 Days Complete  Home Care Screening Complete  Medication Review (RN CM) Complete  Some recent data might be hidden

## 2020-02-03 NOTE — Plan of Care (Signed)

## 2020-02-03 NOTE — TOC Initial Note (Signed)
Transition of Care [facility]) - Initial/Assessment Note    Patient Details  Name: Whitney Steele MRN: [id number] Date of Birth: 1946-01-08  [facility] CM/SW Contact:    Carmina Miller, LCSWA Phone Number: 02/03/2020, 2:20 PM  Clinical Narrative:                 CSW meet with Pt at bedside, confirmed that she is going back to [facility]. Pt stated it was ok to speak with her Daughter [name].   Expected Discharge Plan: Skilled Nursing Facility Barriers to Discharge: Barriers Resolved   Patient Goals and CMS Choice Patient states their goals for this hospitalization and ongoing recovery are:: get better soon CMS Medicare.gov Compare Post Acute Care list provided to:: Patient Choice offered to / list presented to : Patient  Expected Discharge Plan and Services Expected Discharge Plan: Skilled Nursing Facility In-house Referral: Clinical Social Work   Post Acute Care Choice: Skilled Nursing Facility Living arrangements for the past 2 months: Skilled Nursing Facility Expected Discharge Date: 02/03/20                                    Prior Living Arrangements/Services Living arrangements for the past 2 months: Skilled Nursing Facility Lives with:: Facility Resident Patient language and need for interpreter reviewed:: No Do you feel safe going back to the place where you live?: Yes      Need for Family Participation in Patient Care: Yes (Comment) Care giver support system in place?: Yes (comment)   Criminal Activity/Legal Involvement Pertinent to Current Situation/Hospitalization: No - Comment as needed  Activities of Daily Living      Permission Sought/Granted Permission sought to share information with : Facility Medical sales representative, Family Supports Permission granted to share information with : Yes, Verbal Permission Granted  Share Information with NAME: Kimber Relic [phone]  Permission granted to share info w AGENCY: [facility]  Permission  granted to share info w Relationship: Daughter  Permission granted to share info w Contact Information: [phone]  Emotional Assessment Appearance:: Appears stated age Attitude/Demeanor/Rapport: Gracious Affect (typically observed): Calm Orientation: : Oriented to Self, Oriented to Place, Oriented to Situation Alcohol / Substance Use: Not Applicable Psych Involvement: No (comment)  Admission diagnosis:  Respiration abnormal [R06.9] Hypoxia [R09.02] LFT elevation [R79.89] Dysphagia [R13.10] Acute respiratory failure with hypoxia and hypercapnia (HCC) [J96.01, J96.02] Patient Active Problem List   Diagnosis Date Noted  . Hypophosphatemia 01/30/2020  . SIRS (systemic inflammatory response syndrome) (HCC) 01/30/2020  . Abdominal pain 01/28/2020  . Pulmonary hypertension (HCC) 01/28/2020  . Esophageal dysmotilities   . Schatzki's ring   . Abnormal esophagram   . Dysphagia   . LFT elevation   . Hypoxia   . Goals of care, counseling/discussion   . Palliative care by specialist   . Acute respiratory failure with hypoxia and hypercapnia (HCC) 01/25/2020  . AKI (acute kidney injury) (HCC) 01/25/2020  . Transaminitis 01/25/2020  . Hyperkalemia 01/25/2020  . DNR (do not resuscitate) 01/25/2020  . Elevated brain natriuretic peptide (BNP) level 01/25/2020  . History of rheumatoid arthritis 01/25/2020  . Pulmonary fibrosis (HCC) 01/25/2020  . COPD (chronic obstructive pulmonary disease) (HCC) 01/25/2020  . Confusion   . Unilateral AKA, left (HCC)   . Sacral ulcer (HCC) 10/06/2018  . Cellulitis of left lower extremity   . Altered mental status 10/05/2018   PCP:  Blane Ohara, MD Pharmacy:   [facility] [place], [place] - [address] [place] [address] Phone: [phone] Fax: [phone]     Social Determinants of Health (SDOH) Interventions    Readmission Risk Interventions Readmission Risk Prevention Plan 10/12/2018   Transportation Screening Complete  PCP or Specialist Appt within 5-7 Days Complete  Home Care Screening Complete  Medication Review (RN CM) Complete  Some recent data might be hidden

## 2020-02-03 NOTE — Progress Notes (Signed)
RN attempted to call SNF to give report. Will try again.

## 2020-02-03 NOTE — Progress Notes (Signed)
Pt states she can't breathe.  Pt had removed nasal cannula from nose. RN replaced nasal cannula and pt states she can breathe now. SpO2= 95%

## 2020-02-03 NOTE — Discharge Summary (Signed)
Physician Discharge Summary  AMILLIA BIFFLE PFX:[id number] DOB: 06-06-1946 DOA: 01/25/2020  PCP: Blane Ohara, MD  Admit date: 01/25/2020 Discharge date: 02/03/2020  Admitted From: SNF Disposition:  SNF  Recommendations for Outpatient Follow-up:  1. Follow up with PCP in 1-2 weeks 2. Please obtain BMP/CBC in one week   Home Health:No Equipment/Devices:3L oxygen  Discharge Condition:Stable CODE STATUS:Full Diet recommendation: Heart Healthy    Brief/Interim Summary: 74 y.o. female past medical history of chronic respiratory failure with hypoxia on 3 L of oxygen due to COPD/pulmonary fibrosis, rheumatoid arthritis, hypothyroidism, vascular dementia essential hypertension dysphagia secondary to stricture diagnosed on 01/25/2020 comes in with worsening shortness of breath per EMS saturations were in the 70s requiring a nonrebreather.  Discharge Diagnoses:  Active Problems:   Unilateral AKA, left (HCC)   Acute respiratory failure with hypoxia and hypercapnia (HCC)   AKI (acute kidney injury) (HCC)   Transaminitis   Hyperkalemia   DNR (do not resuscitate)   Elevated brain natriuretic peptide (BNP) level   History of rheumatoid arthritis   Pulmonary fibrosis (HCC)   COPD (chronic obstructive pulmonary disease) (HCC)   Dysphagia   LFT elevation   Hypoxia   Goals of care, counseling/discussion   Palliative care by specialist   Abdominal pain   Pulmonary hypertension (HCC)   Esophageal dysmotilities   Schatzki's ring   Abnormal esophagram   Hypophosphatemia   SIRS (systemic inflammatory response syndrome) (HCC)  Acute on chronic respiratory failure with hypoxia and hypercarbia probably secondary to combined COPD and diastolic heart failure in the setting of pulmonary hypertension: She was started on IV Lasix and she diuresed well, she was started on inhalers and steroids with significant improvement.  She completed a course of steroids to go home on Lasix and incentive  spirometry. Cardiology was consulted they recommended to start digoxin which she tolerated well.  Elevated LFTs: Likely due to heart failure now resolved.  Upper abdominal pain: No acute findings, I resolved independently.  Dysphagia secondary to a Chesky ring: Status post EGD with dilation on 01/28/2020, will continue PPI as an outpatient.  Acute kidney injury with hyperkalemia: Likely prerenal resolved with IV fluids and Lokelma.  Unwitnessed fall from bed: Imaging showed no fracture patient is wheelchair-bound.  Protein caloric malnutrition: Continue current regimen.  Essential hypertension: Diltiazem was discontinued her blood pressure is fairly controlled currently on nitroglycerin  Rheumatoid arthritis: Continue medications weekly.  Vascular dementia with anxiety: Continue Cymbalta and Xanax as an outpatient.    Discharge Instructions  Discharge Instructions    Diet - low sodium heart healthy   Complete by: As directed    Increase activity slowly   Complete by: As directed    No wound care   Complete by: As directed      Allergies as of 02/03/2020      Reactions   Iodinated Diagnostic Agents Hives   Tape Other (See Comments)   Tears skin - please use paper tape ("allergic," per paperwork)      Medication List    STOP taking these medications   diltiazem 30 MG tablet Commonly known as: CARDIZEM   geriatric multivitamins-minerals Liqd   loratadine 10 MG tablet Commonly known as: CLARITIN   multivitamin with minerals Tabs tablet   NON FORMULARY   promethazine 25 MG tablet Commonly known as: PHENERGAN   thiamine 100 MG tablet   vitamin C 500 MG tablet Commonly known as: ASCORBIC ACID     TAKE these medications   acetaminophen 325  MG tablet Commonly known as: TYLENOL Take 650 mg by mouth every 6 (six) hours as needed for mild pain or fever.   albuterol 108 (90 Base) MCG/ACT inhaler Commonly known as: VENTOLIN HFA Inhale 2 puffs into the  lungs every 6 (six) hours as needed for wheezing or shortness of breath. What changed: Another medication with the same name was removed. Continue taking this medication, and follow the directions you see here.   ALPRAZolam 0.25 MG tablet Commonly known as: XANAX Take 1 tablet (0.25 mg total) by mouth 2 (two) times daily.   aspirin EC 81 MG tablet Take 81 mg by mouth daily.   atorvastatin 20 MG tablet Commonly known as: LIPITOR Take 20 mg by mouth at bedtime.   celecoxib 100 MG capsule Commonly known as: CELEBREX Take 100 mg by mouth every 12 (twelve) hours as needed for pain.   Combivent Respimat 20-100 MCG/ACT Aers respimat Generic drug: Ipratropium-Albuterol Inhale 1 puff into the lungs every 6 (six) hours as needed for shortness of breath.   ipratropium-albuterol 0.5-2.5 (3) MG/3ML Soln Commonly known as: DUONEB Take 3 mLs by nebulization every 6 (six) hours as needed (for wheezing or shortness of breath).   digoxin 0.125 MG tablet Commonly known as: LANOXIN Take 1 tablet (0.125 mg total) by mouth every Monday, Wednesday, and Friday. Start taking on: February 04, 2020   docusate sodium 100 MG capsule Commonly known as: COLACE Take 100 mg by mouth 2 (two) times daily.   DULoxetine 60 MG capsule Commonly known as: CYMBALTA Take 60 mg by mouth daily.   feeding supplement (ENSURE ENLIVE) Liqd Take 237 mLs by mouth 3 (three) times daily with meals.   ferrous sulfate 325 (65 FE) MG tablet Take 325 mg by mouth daily with breakfast.   folic acid 1 MG tablet Commonly known as: FOLVITE Take 1 tablet (1 mg total) by mouth daily.   furosemide 20 MG tablet Commonly known as: LASIX Take 1 tablet (20 mg total) by mouth every Monday. Start taking on: February 07, 2020   gabapentin 100 MG capsule Commonly known as: NEURONTIN Take 1 capsule (100 mg total) by mouth 2 (two) times daily.   isosorbide dinitrate 30 MG tablet Commonly known as: ISORDIL Take 30 mg by mouth 2 (two)  times daily.   levETIRAcetam 500 MG tablet Commonly known as: KEPPRA Take 500 mg by mouth 2 (two) times daily.   melatonin 5 MG Tabs Take 5 mg by mouth at bedtime.   omeprazole 40 MG capsule Commonly known as: PRILOSEC Take 1 capsule (40 mg total) by mouth 2 (two) times daily.   Orencia 125 MG/ML Sosy Generic drug: Abatacept Inject 125 mg into the skin every Thursday.   oxyCODONE-acetaminophen 5-325 MG tablet Commonly known as: PERCOCET/ROXICET Take 1 tablet by mouth every 6 (six) hours as needed for moderate pain. What changed:   when to take this  reasons to take this   OXYGEN Inhale 2 L/min into the lungs continuous.   polyethylene glycol powder 17 GM/SCOOP powder Commonly known as: GLYCOLAX/MIRALAX Take 17 g by mouth daily.   potassium chloride 10 MEQ tablet Commonly known as: KLOR-CON Take 1 tablet (10 mEq total) by mouth every Monday. Start taking on: February 07, 2020   ranolazine 500 MG 12 hr tablet Commonly known as: RANEXA Take 500 mg by mouth every 12 (twelve) hours.   Vitamin D3 125 MCG (5000 UT) Caps Take 5,000 Units by mouth daily.       Follow-up  Information    Orpah Cobb, MD. Schedule an appointment as soon as possible for a visit in 2 week(s).   Specialty: Cardiology Contact information: [address] [place] [address] [phone]              Allergies  Allergen Reactions  . Iodinated Diagnostic Agents Hives  . Tape Other (See Comments)    Tears skin - please use paper tape ("allergic," per paperwork)    Consultations:  Cardiology   Procedures/Studies: US Abdomen Limited  Result Date: 01/25/2020 CLINICAL DATA:  74 year old female with elevated LFTs. EXAM: ULTRASOUND ABDOMEN LIMITED RIGHT UPPER QUADRANT COMPARISON:  Right upper quadrant ultrasound dated 02/14/2006. FINDINGS: Gallbladder: Cholecystectomy. Common bile duct: Diameter: 4 mm Liver: There is diffuse increased liver echogenicity most commonly seen in  the setting of fatty infiltration. Superimposed inflammation or fibrosis is not excluded. Clinical correlation is recommended. Portal vein is patent on color Doppler imaging with normal direction of blood flow towards the liver. Other: There is a partially visualized and incompletely evaluated 2 cm right renal upper pole cyst. IMPRESSION: 1. Cholecystectomy. 2. Fatty liver. 3. Patent main portal vein with hepatopetal flow. Electronically Signed   By: Elgie Collard M.D.   On: 01/25/2020 18:14   DG CHEST PORT 1 VIEW  Result Date: 02/01/2020 CLINICAL DATA:  Shortness of breath EXAM: PORTABLE CHEST 1 VIEW COMPARISON:  01/30/2020 FINDINGS: Stable cardiomediastinal contours. Chronic fibrotic changes within the lung bases, right worse than left. Hyperexpanded lungs. No superimposed airspace opacity. No pleural effusion or pneumothorax. IMPRESSION: Chronic fibrotic changes within the lung bases. No superimposed airspace opacity. Electronically Signed   By: Duanne Guess D.O.   On: 02/01/2020 08:32   DG CHEST PORT 1 VIEW  Result Date: 01/30/2020 CLINICAL DATA:  Sirs.  Rule new edema. EXAM: PORTABLE CHEST 1 VIEW COMPARISON:  January 26, 2020 FINDINGS: The heart size is borderline to mildly enlarged. The hila are unchanged. The mediastinum is normal. Peripheral reticular opacities are chronic consistent with interstitial lung disease. Increased interstitial a bat opacities bilaterally. No other interval changes. IMPRESSION: 1. Chronic peripheral reticulations consistent with interstitial lung disease. 2. Increased interstitial opacities more diffusely represents a superimposed process which could represent atypical infection or edema. Recommend clinical correlation and follow-up imaging to ensure resolution. Electronically Signed   By: Gerome Sam III M.D   On: 01/30/2020 17:05   DG Chest Port 1 View  Result Date: 01/26/2020 CLINICAL DATA:  Shortness of breath EXAM: PORTABLE CHEST 1 VIEW COMPARISON:   01/25/2020 FINDINGS: Bilateral chronic interstitial thickening. Bilateral emphysematous changes. No focal consolidation. No pleural effusion or pneumothorax. Heart and mediastinal contours are unremarkable. No acute osseous abnormality. IMPRESSION: No active disease. Electronically Signed   By: Elige Ko   On: 01/26/2020 16:23   DG Chest Portable 1 View  Result Date: 01/25/2020 CLINICAL DATA:  Shortness of breath. EXAM: PORTABLE CHEST 1 VIEW COMPARISON:  10/13/2019, CT 08/07/2018 FINDINGS: Mild cardiomegaly. Unchanged mediastinal contours. Emphysema with interstitial coarsening. Bibasilar reticulation consistent with honeycombing. No acute or confluent airspace disease. No pleural effusion or pneumothorax. Bones are under mineralized. No acute osseous abnormalities are seen. IMPRESSION: 1. Cardiomegaly. 2. Emphysema.  Basilar honeycombing/fibrosis. Electronically Signed   By: Narda Rutherford M.D.   On: 01/25/2020 03:10   DG Abd Portable 1V  Result Date: 01/30/2020 CLINICAL DATA:  Sirs.  Rule out new edema. EXAM: PORTABLE ABDOMEN - 1 VIEW COMPARISON:  March 17, 2009 FINDINGS: Contrast is seen throughout the length of the  normal caliber colon. No bowel obstruction. No acute abnormalities are identified. No free air free fluid. IMPRESSION: No acute abnormalities.  Contrast throughout the colon. Electronically Signed   By: Gerome Sam III M.D   On: 01/30/2020 17:05   ECHOCARDIOGRAM COMPLETE  Result Date: 01/26/2020    ECHOCARDIOGRAM REPORT   Patient Name:   WILHELMENA ZEA Brent Date of Exam: 01/26/2020 Medical Rec #:  [id number]          Height:       66.0 in Accession #:    [id number]         Weight:       112.0 lb Date of Birth:  1946/04/09          BSA:          1.563 m Patient Age:    73 years           BP:           122/69 mmHg Patient Gender: F                  HR:           105 bpm. Exam Location:  Inpatient Procedure: 2D Echo Indications:    cardiomyopathy  History:        Patient has prior  history of Echocardiogram examinations, most                 recent 10/07/2018. COPD; Risk Factors:Hypertension and                 Dyslipidemia.  Sonographer:    Celene Skeen RDCS (AE) Referring Phys: [phone] [facility]  Sonographer Comments: restricted mobility - patient unable to turn on left side. additionally, head of the patient unable to lower significantly, which made imaging challenging IMPRESSIONS  1. Marded right sided heart failure.  2. Left ventricular ejection fraction, by estimation, is 45 to 50%. The left ventricle has mildly decreased function. The left ventricle demonstrates global hypokinesis. Left ventricular diastolic parameters were normal.  3. Right ventricular systolic function is severely reduced. The right ventricular size is severely enlarged. There is severely elevated pulmonary artery systolic pressure.  4. Left atrial size was mildly dilated.  5. Right atrial size was severely dilated.  6. The mitral valve is abnormal. Mild mitral valve regurgitation. No evidence of mitral stenosis.  7. Tricuspid valve regurgitation is severe.  8. The aortic valve is tricuspid. Aortic valve regurgitation is mild. Mild to moderate aortic valve sclerosis/calcification is present, without any evidence of aortic stenosis.  9. The inferior vena cava is normal in size with greater than 50% respiratory variability, suggesting right atrial pressure of 3 mmHg. FINDINGS  Left Ventricle: Left ventricular ejection fraction, by estimation, is 45 to 50%. The left ventricle has mildly decreased function. The left ventricle demonstrates global hypokinesis. The left ventricular internal cavity size was normal in size. There is  no left ventricular hypertrophy. Left ventricular diastolic parameters were normal. Right Ventricle: The right ventricular size is severely enlarged. Right vetricular wall thickness was not assessed. Right ventricular systolic function is severely reduced. There is severely elevated  pulmonary artery systolic pressure. The tricuspid regurgitant velocity is 3.91 m/s, and with an assumed right atrial pressure of 10 mmHg, the estimated right ventricular systolic pressure is 71.2 mmHg. Left Atrium: Left atrial size was mildly dilated. Right Atrium: Right atrial size was severely dilated. Pericardium: There is no evidence of pericardial effusion. Mitral Valve: The mitral valve is  abnormal. There is moderate thickening of the mitral valve leaflet(s). There is moderate calcification of the mitral valve leaflet(s). Normal mobility of the mitral valve leaflets. Moderate mitral annular calcification. Mild mitral valve regurgitation. No evidence of mitral valve stenosis. Tricuspid Valve: The tricuspid valve is normal in structure. Tricuspid valve regurgitation is severe. No evidence of tricuspid stenosis. Aortic Valve: The aortic valve is tricuspid. Aortic valve regurgitation is mild. Mild to moderate aortic valve sclerosis/calcification is present, without any evidence of aortic stenosis. Pulmonic Valve: The pulmonic valve was normal in structure. Pulmonic valve regurgitation is not visualized. No evidence of pulmonic stenosis. Aorta: The aortic root is normal in size and structure. Venous: The inferior vena cava is normal in size with greater than 50% respiratory variability, suggesting right atrial pressure of 3 mmHg. IAS/Shunts: No atrial level shunt detected by color flow Doppler. Additional Comments: Marded right sided heart failure.  LEFT VENTRICLE PLAX 2D LVIDd:         4.20 cm LVIDs:         2.80 cm LV PW:         1.40 cm LV IVS:        1.00 cm LVOT diam:     2.00 cm LV SV:         35 LV SV Index:   23 LVOT Area:     3.14 cm  LEFT ATRIUM         Index      RIGHT ATRIUM           Index LA diam:    4.00 cm 2.56 cm/m RA Area:     25.90 cm                                RA Volume:   96.80 ml  61.93 ml/m  AORTIC VALVE LVOT Vmax:   73.10 cm/s LVOT Vmean:  43.500 cm/s LVOT VTI:    0.113 m TRICUSPID  VALVE TR Peak grad:   61.2 mmHg TR Vmax:        391.00 cm/s  SHUNTS Systemic VTI:  0.11 m Systemic Diam: 2.00 cm Charlton Haws MD Electronically signed by Charlton Haws MD Signature Date/Time: 01/26/2020/10:51:37 AM    Final    DG ESOPHAGUS W SINGLE CM (SOL OR THIN BA)  Result Date: 01/27/2020 CLINICAL DATA:  74 year old female inpatient with COPD and dementia with reported dysphagia and failure to thrive. EXAM: ESOPHOGRAM/BARIUM SWALLOW TECHNIQUE: Single contrast examination was performed using  thin barium. FLUOROSCOPY TIME:  Fluoroscopy Time:  1 minutes 18 seconds Number of Acquired Spot Images: 1 COMPARISON:  08/07/2026 chest CT. FINDINGS: Examination significantly limited by patient mental status and mobility limitations. Examination performed with the patient in the left lateral decubitus position with slight head elevation. No laryngeal penetration or tracheobronchial aspiration observed. Small to moderate hiatal hernia. Unable to assess for gastroesophageal reflux. No gross esophageal mass or ulcer. A small traction diverticulum is noted along the right midthoracic esophagus. There is a suggestion of a short segment of mild smooth narrowing in the lower thoracic esophagus at the esophagogastric junction. Otherwise normal esophageal distensibility. Suggestion of moderate esophageal dysmotility with proximal escape and tertiary contractions. Barium tablet not administered due to above patient limitations. IMPRESSION: 1. Significantly limited study, see comments. 2. Small to moderate hiatal hernia. 3. Suggestion of a short segment of mild smooth narrowing in the lower thoracic esophagus at the esophagogastric junction, cannot  exclude a mild peptic stricture, see comments. No gross esophageal mass. 4. 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 5. Small traction diverticulum along the right mid thoracic esophagus. 6. No laryngeal penetration or tracheobronchial aspiration  observed. Electronically Signed   By: Delbert Phenix M.D.   On: 01/27/2020 11:36     Subjective: No complaints  Discharge Exam: Vitals:   02/03/20 0454 02/03/20 0500  BP: (!) 110/50   Pulse: (!) 101   Resp: 18   Temp: 98.2 F (36.8 C)   SpO2: 95% 91%   Vitals:   02/02/20 1956 02/02/20 2249 02/03/20 0454 02/03/20 0500  BP: 129/76 135/81 (!) 110/50   Pulse: 98  (!) 101   Resp: 15  18   Temp: 97.6 F (36.4 C)  98.2 F (36.8 C)   TempSrc: Oral  Oral   SpO2: 98%  95% 91%  Weight:   53.1 kg   Height:        General: Pt is alert, awake, not in acute distress Cardiovascular: RRR, S1/S2 +, no rubs, no gallops Respiratory: CTA bilaterally, no wheezing, no rhonchi Abdominal: Soft, NT, ND, bowel sounds + Extremities: no edema, no cyanosis    The results of significant diagnostics from this hospitalization (including imaging, microbiology, ancillary and laboratory) are listed below for reference.     Microbiology: Recent Results (from the past 240 hour(s))  Culture, blood (Routine x 2)     Status: None   Collection Time: 01/25/20  3:00 AM   Specimen: BLOOD  Result Value Ref Range Status   Specimen Description BLOOD RIGHT ARM  Final   Special Requests   Final    BOTTLES DRAWN AEROBIC AND ANAEROBIC Blood Culture results may not be optimal due to an excessive volume of blood received in culture bottles   Culture   Final    NO GROWTH 5 DAYS Performed at [facility], [address] [place], [place] [address]    Report Status 01/30/2020 FINAL  Final  Culture, blood (Routine x 2)     Status: None   Collection Time: 01/25/20  3:10 AM   Specimen: BLOOD  Result Value Ref Range Status   Specimen Description BLOOD RIGHT HAND  Final   Special Requests   Final    BOTTLES DRAWN AEROBIC ONLY Blood Culture adequate volume   Culture   Final    NO GROWTH 5 DAYS Performed at [facility], [address], [place], [place] [address]    Report Status 01/30/2020 FINAL  Final   SARS Coronavirus 2 by RT PCR (hospital order, performed in [facility] hospital lab) Nasopharyngeal Nasopharyngeal Swab     Status: None   Collection Time: 01/25/20  3:26 AM   Specimen: Nasopharyngeal Swab  Result Value Ref Range Status   SARS Coronavirus 2 NEGATIVE NEGATIVE Final    Comment: (NOTE) SARS-CoV-2 target nucleic acids are NOT DETECTED.  The SARS-CoV-2 RNA is generally detectable in upper and lower respiratory specimens during the acute phase of infection. The lowest concentration of SARS-CoV-2 viral copies this assay can detect is 250 copies / mL. A negative result does not preclude SARS-CoV-2 infection and should not be used as the sole basis for treatment or other patient management decisions.  A negative result may occur with improper specimen collection / handling, submission of specimen other than nasopharyngeal swab, presence of viral mutation(s) within the areas targeted by this assay, and inadequate number of viral copies (<250 copies /  mL). A negative result must be combined with clinical observations, patient history, and epidemiological information.  Fact Sheet for Patients:   BoilerBrush.com.cy  Fact Sheet for Healthcare Providers: https://pope.com/  This test is not yet approved or  cleared by the [place] FDA and has been authorized for detection and/or diagnosis of SARS-CoV-2 by FDA under an Emergency Use Authorization (EUA).  This EUA will remain in effect (meaning this test can be used) for the duration of the COVID-19 declaration under Section 564(b)(1) of the Act, 21 U.S.C. section 360bbb-3(b)(1), unless the authorization is terminated or revoked sooner.  Performed at [facility], [address], [place], [place] [address]   MRSA PCR Screening     Status: Abnormal   Collection Time: 01/28/20 12:31 AM   Specimen: Nasopharyngeal  Result Value Ref Range Status   MRSA by PCR POSITIVE (A)  NEGATIVE Final    Comment:        The GeneXpert MRSA Assay (FDA approved for NASAL specimens only), is one component of a comprehensive MRSA colonization surveillance program. It is not intended to diagnose MRSA infection nor to guide or monitor treatment for MRSA infections. RESULT CALLED TO, READ BACK BY AND VERIFIED WITH: A ROBERTS RN 01/28/20 0407 JDW Performed at [facility], [address], [place], [place] [address]   SARS CORONAVIRUS 2 (TAT 6-24 HRS) Nasopharyngeal Nasopharyngeal Swab     Status: None   Collection Time: 02/01/20  1:38 PM   Specimen: Nasopharyngeal Swab  Result Value Ref Range Status   SARS Coronavirus 2 NEGATIVE NEGATIVE Final    Comment: (NOTE) SARS-CoV-2 target nucleic acids are NOT DETECTED.  The SARS-CoV-2 RNA is generally detectable in upper and lower respiratory specimens during the acute phase of infection. Negative results do not preclude SARS-CoV-2 infection, do not rule out co-infections with other pathogens, and should not be used as the sole basis for treatment or other patient management decisions. Negative results must be combined with clinical observations, patient history, and epidemiological information. The expected result is Negative.  Fact Sheet for Patients: HairSlick.no  Fact Sheet for Healthcare Providers: quierodirigir.com  This test is not yet approved or cleared by the [place] FDA and  has been authorized for detection and/or diagnosis of SARS-CoV-2 by FDA under an Emergency Use Authorization (EUA). This EUA will remain  in effect (meaning this test can be used) for the duration of the COVID-19 declaration under Se ction 564(b)(1) of the Act, 21 U.S.C. section 360bbb-3(b)(1), unless the authorization is terminated or revoked sooner.  Performed at [facility], [address] [place], [place] [address]      Labs: BNP (last 3 results) Recent  Labs    01/25/20 0328  BNP 1,062.7*   Basic Metabolic Panel: Recent Labs  Lab 01/28/20 0523 01/29/20 0244 01/30/20 0611 01/31/20 0643  NA 135 138 139 138  K 3.8 3.9 3.8 4.0  CL 92* 93* 93* 93*  CO2 34* 37* 34* 36*  GLUCOSE 92 96 99 96  BUN 33* 30* 23 23  CREATININE 0.90 0.75 0.70 0.68  CALCIUM 8.5* 8.4* 8.4* 8.3*  MG 2.0 1.9 1.7 1.7  PHOS 2.4* 2.7 1.9* 3.3   Liver Function Tests: Recent Labs  Lab 01/28/20 0523 01/29/20 0244 01/30/20 0611  AST 141* 51* 24  ALT 225* 138* 92*  ALKPHOS 93 79 80  BILITOT 1.2 1.1 1.2  PROT 6.8 6.4* 6.4*  ALBUMIN 2.6* 2.4* 2.4*   Recent Labs  Lab 01/28/20 0523  LIPASE 44   No results for input(s): AMMONIA in the last 168 hours. CBC: Recent Labs  Lab 01/28/20 0523 01/29/20 0244 01/30/20 0611 01/31/20 0643  WBC 11.0* 10.5 14.7* 12.4*  HGB 14.5 14.3 14.6 14.0  HCT 47.7* 47.0* 47.8* 46.3*  MCV 93.3 94.0 94.5 94.3  PLT 200 167 171 161   Cardiac Enzymes: No results for input(s): CKTOTAL, CKMB, CKMBINDEX, TROPONINI in the last 168 hours. BNP: Invalid input(s): POCBNP CBG: Recent Labs  Lab 01/29/20 1132 01/29/20 1739 01/29/20 2039 01/30/20 1245 01/30/20 1603  GLUCAP 169* 148* 166* 160* 213*   D-Dimer No results for input(s): DDIMER in the last 72 hours. Hgb A1c No results for input(s): HGBA1C in the last 72 hours. Lipid Profile No results for input(s): CHOL, HDL, LDLCALC, TRIG, CHOLHDL, LDLDIRECT in the last 72 hours. Thyroid function studies No results for input(s): TSH, T4TOTAL, T3FREE, THYROIDAB in the last 72 hours.  Invalid input(s): FREET3 Anemia work up No results for input(s): VITAMINB12, FOLATE, FERRITIN, TIBC, IRON, RETICCTPCT in the last 72 hours. Urinalysis    Component Value Date/Time   COLORURINE YELLOW 01/28/2020 1233   APPEARANCEUR CLEAR 01/28/2020 1233   LABSPEC 1.016 01/28/2020 1233   PHURINE 6.0 01/28/2020 1233   GLUCOSEU NEGATIVE 01/28/2020 1233   HGBUR NEGATIVE 01/28/2020 1233    BILIRUBINUR NEGATIVE 01/28/2020 1233   KETONESUR NEGATIVE 01/28/2020 1233   PROTEINUR NEGATIVE 01/28/2020 1233   NITRITE NEGATIVE 01/28/2020 1233   LEUKOCYTESUR SMALL (A) 01/28/2020 1233   Sepsis Labs Invalid input(s): PROCALCITONIN,  WBC,  LACTICIDVEN Microbiology Recent Results (from the past 240 hour(s))  Culture, blood (Routine x 2)     Status: None   Collection Time: 01/25/20  3:00 AM   Specimen: BLOOD  Result Value Ref Range Status   Specimen Description BLOOD RIGHT ARM  Final   Special Requests   Final    BOTTLES DRAWN AEROBIC AND ANAEROBIC Blood Culture results may not be optimal due to an excessive volume of blood received in culture bottles   Culture   Final    NO GROWTH 5 DAYS Performed at [facility], [address], [place], [place] [address]    Report Status 01/30/2020 FINAL  Final  Culture, blood (Routine x 2)     Status: None   Collection Time: 01/25/20  3:10 AM   Specimen: BLOOD  Result Value Ref Range Status   Specimen Description BLOOD RIGHT HAND  Final   Special Requests   Final    BOTTLES DRAWN AEROBIC ONLY Blood Culture adequate volume   Culture   Final    NO GROWTH 5 DAYS Performed at [facility], [address], [place], [place] [address]    Report Status 01/30/2020 FINAL  Final  SARS Coronavirus 2 by RT PCR (hospital order, performed in [facility] hospital lab) Nasopharyngeal Nasopharyngeal Swab     Status: None   Collection Time: 01/25/20  3:26 AM   Specimen: Nasopharyngeal Swab  Result Value Ref Range Status   SARS Coronavirus 2 NEGATIVE NEGATIVE Final    Comment: (NOTE) SARS-CoV-2 target nucleic acids are NOT DETECTED.  The SARS-CoV-2 RNA is generally detectable in upper and lower respiratory specimens during the acute phase of infection. The lowest concentration of SARS-CoV-2 viral copies this assay can detect is 250 copies / mL. A negative result does not preclude SARS-CoV-2 infection and should not be used as the sole basis  for treatment or other patient management decisions.  A negative result may occur with improper specimen collection / handling, submission of specimen other than nasopharyngeal swab, presence of viral mutation(s) within the areas targeted by this assay, and inadequate number of viral copies (<250 copies / mL). A negative result must be combined with clinical observations, patient history, and epidemiological information.  Fact Sheet for Patients:   BoilerBrush.com.cy  Fact Sheet for Healthcare Providers: https://pope.com/  This test is not yet approved or  cleared by the [place] FDA and has been authorized for detection and/or diagnosis of SARS-CoV-2 by FDA under an Emergency Use Authorization (EUA).  This EUA will remain in effect (meaning this test can be used) for the duration of the COVID-19 declaration under Section 564(b)(1) of the Act, 21 U.S.C. section 360bbb-3(b)(1), unless the authorization is terminated or revoked sooner.  Performed at [facility], [address], [place], [place] [address]   MRSA PCR Screening     Status: Abnormal   Collection Time: 01/28/20 12:31 AM   Specimen: Nasopharyngeal  Result Value Ref Range Status   MRSA by PCR POSITIVE (A) NEGATIVE Final    Comment:        The GeneXpert MRSA Assay (FDA approved for NASAL specimens only), is one component of a comprehensive MRSA colonization surveillance program. It is not intended to diagnose MRSA infection nor to guide or monitor treatment for MRSA infections. RESULT CALLED TO, READ BACK BY AND VERIFIED WITH: A ROBERTS RN 01/28/20 0407 JDW Performed at [facility], [address], [place], [place] [address]   SARS CORONAVIRUS 2 (TAT 6-24 HRS) Nasopharyngeal Nasopharyngeal Swab     Status: None   Collection Time: 02/01/20  1:38 PM   Specimen: Nasopharyngeal Swab  Result Value Ref Range Status   SARS Coronavirus 2 NEGATIVE NEGATIVE  Final    Comment: (NOTE) SARS-CoV-2 target nucleic acids are NOT DETECTED.  The SARS-CoV-2 RNA is generally detectable in upper and lower respiratory specimens during the acute phase of infection. Negative results do not preclude SARS-CoV-2 infection, do not rule out co-infections with other pathogens, and should not be used as the sole basis for treatment or other patient management decisions. Negative results must be combined with clinical observations, patient history, and epidemiological information. The expected result is Negative.  Fact Sheet for Patients: HairSlick.no  Fact Sheet for Healthcare Providers: quierodirigir.com  This test is not yet approved or cleared by the [place] FDA and  has been authorized for detection and/or diagnosis of SARS-CoV-2 by FDA under an Emergency Use Authorization (EUA). This EUA will remain  in effect (meaning this test can be used) for the duration of the COVID-19 declaration under Se ction 564(b)(1) of the Act, 21 U.S.C. section 360bbb-3(b)(1), unless the authorization is terminated or revoked sooner.  Performed at [facility], [address] [place], [place] [address]      Time coordinating discharge: Over 30 minutes  SIGNED:   Marinda Elk, MD  [facility] 02/03/2020, 9:29 AM Pager   If 7PM-7AM, please contact night-coverage [email or web address] Password [email or web address]

## 2020-02-03 NOTE — [place] FL2 (Signed)
[place] MEDICAID FL2 LEVEL OF CARE SCREENING TOOL     IDENTIFICATION  Patient Name: Whitney Steele Birthdate: [birth year] Sex: female Admission Date (Current Location): 01/25/2020  [place] and [place] Number:  Haynes Bast [id number] P Facility and Address:  [facility] [place], [address], [place], [place] [address]      Provider Number: [id number]  Attending Physician Name and Address:  Marinda Elk, MD  Relative Name and Phone Number:       Current Level of Care: Hospital Recommended Level of Care: Nursing Facility Prior Approval Number:    Date Approved/Denied:   PASRR Number: [id number]  Discharge Plan: SNF    Current Diagnoses: Patient Active Problem List   Diagnosis Date Noted  . Hypophosphatemia 01/30/2020  . SIRS (systemic inflammatory response syndrome) (HCC) 01/30/2020  . Abdominal pain 01/28/2020  . Pulmonary hypertension (HCC) 01/28/2020  . Esophageal dysmotilities   . Schatzki's ring   . Abnormal esophagram   . Dysphagia   . LFT elevation   . Hypoxia   . Goals of care, counseling/discussion   . Palliative care by specialist   . Acute respiratory failure with hypoxia and hypercapnia (HCC) 01/25/2020  . AKI (acute kidney injury) (HCC) 01/25/2020  . Transaminitis 01/25/2020  . Hyperkalemia 01/25/2020  . DNR (do not resuscitate) 01/25/2020  . Elevated brain natriuretic peptide (BNP) level 01/25/2020  . History of rheumatoid arthritis 01/25/2020  . Pulmonary fibrosis (HCC) 01/25/2020  . COPD (chronic obstructive pulmonary disease) (HCC) 01/25/2020  . Confusion   . Unilateral AKA, left (HCC)   . Sacral ulcer (HCC) 10/06/2018  . Cellulitis of left lower extremity   . Altered mental status 10/05/2018    Orientation RESPIRATION BLADDER Height & Weight     Self   (4 liters) External catheter Weight: 117 lb (53.1 kg) Height:  5\' 6"  (167.6 cm)  BEHAVIORAL SYMPTOMS/MOOD NEUROLOGICAL BOWEL NUTRITION STATUS      Continent  Diet (See dc summary)  AMBULATORY STATUS COMMUNICATION OF NEEDS Skin   Limited Assist Verbally PU Stage and Appropriate Care (unstageable PU coccyx)                       Personal Care Assistance Level of Assistance  Feeding, Dressing   Feeding assistance: Limited assistance Dressing Assistance: Limited assistance     Functional Limitations Info  Sight Sight Info: Impaired        SPECIAL CARE FACTORS FREQUENCY  PT (By licensed PT), OT (By licensed OT)     PT Frequency: 5x week OT Frequency: 5x week            Contractures Contractures Info: Not present    Additional Factors Info  Allergies, Psychotropic Code Status Info: DNR Allergies Info: Iodinated Diagnostic Agents, Tape Psychotropic Info: gabapentin 100 MG capsule take 1 capsule  2 times daily, ALPRAZolam 0.25 MG tablet 1 tablet 2 times daily         Current Medications (02/03/2020):  This is the current hospital active medication list Current Facility-Administered Medications  Medication Dose Route Frequency Provider Last Rate Last Admin  . 0.9 %  sodium chloride infusion   Intravenous PRN Roberto Scales D, MD 10 mL/hr at 01/27/20 0914 250 mL at 01/27/20 0914  . albuterol (PROVENTIL) (2.5 MG/3ML) 0.083% nebulizer solution 2.5 mg  2.5 mg Nebulization Q4H PRN Madelyn Flavors A, MD      . ALPRAZolam Prudy Feeler) tablet 0.25 mg  0.25 mg Oral BID Clydie Braun, MD  0.25 mg at 02/03/20 1024  . aspirin EC tablet 81 mg  81 mg Oral Daily Smith, Rondell A, MD   81 mg at 02/03/20 1024  . digoxin (LANOXIN) tablet 0.125 mg  0.125 mg Oral Q M,W,F Orpah Cobb, MD   0.125 mg at 02/02/20 0916  . diltiazem (CARDIZEM) tablet 30 mg  30 mg Oral TID Madelyn Flavors A, MD   30 mg at 02/03/20 1024  . docusate sodium (COLACE) capsule 100 mg  100 mg Oral BID Katrinka Blazing, Rondell A, MD   100 mg at 02/03/20 1024  . DULoxetine (CYMBALTA) DR capsule 60 mg  60 mg Oral Daily Katrinka Blazing, Rondell A, MD   60 mg at 02/03/20 1024  . enoxaparin (LOVENOX)  injection 40 mg  40 mg Subcutaneous Q24H Smith, Rondell A, MD   40 mg at 02/02/20 1320  . ferrous sulfate tablet 325 mg  325 mg Oral Q breakfast Madelyn Flavors A, MD   325 mg at 02/03/20 1024  . folic acid (FOLVITE) tablet 1 mg  1 mg Oral Daily Smith, Rondell A, MD   1 mg at 02/03/20 1025  . furosemide (LASIX) tablet 20 mg  20 mg Oral Q Tenny Craw, Viviano Simas, MD   20 mg at 01/31/20 0839  . gabapentin (NEURONTIN) capsule 100 mg  100 mg Oral BID Madelyn Flavors A, MD   100 mg at 02/03/20 1025  . ipratropium-albuterol (DUONEB) 0.5-2.5 (3) MG/3ML nebulizer solution 3 mL  3 mL Nebulization Q6H PRN Roberto Scales D, MD   3 mL at 02/03/20 0637  . levETIRAcetam (KEPPRA) tablet 500 mg  500 mg Oral BID Madelyn Flavors A, MD   500 mg at 02/03/20 1025  . lip balm (CARMEX) ointment   Topical PRN Laverna Peace, MD   1 application at 01/28/20 [phone]  . LORazepam (ATIVAN) injection 0.5 mg  0.5 mg Intravenous Q4H PRN Madelyn Flavors A, MD   0.5 mg at 01/25/20 1548  . morphine 2 MG/ML injection 2 mg  2 mg Intravenous Q4H PRN Madelyn Flavors A, MD   2 mg at 02/02/20 2028  . ondansetron (ZOFRAN) tablet 4 mg  4 mg Oral Q6H PRN Madelyn Flavors A, MD       Or  . ondansetron (ZOFRAN) injection 4 mg  4 mg Intravenous Q6H PRN Smith, Rondell A, MD      . oxyCODONE (Oxy IR/ROXICODONE) immediate release tablet 5 mg  5 mg Oral Q8H PRN Madelyn Flavors A, MD   5 mg at 02/03/20 0220  . pantoprazole (PROTONIX) EC tablet 40 mg  40 mg Oral BID Madelyn Flavors A, MD   40 mg at 02/03/20 1024  . phosphorus (K PHOS NEUTRAL) tablet 250 mg  250 mg Oral QID Roberto Scales D, MD   250 mg at 02/03/20 1025  . polyethylene glycol (MIRALAX / GLYCOLAX) packet 17 g  17 g Oral Daily Katrinka Blazing, Rondell A, MD   17 g at 02/03/20 1025  . potassium chloride (KLOR-CON) CR tablet 10 mEq  10 mEq Oral Q Tenny Craw, Viviano Simas, MD   10 mEq at 01/31/20 9323  . sodium chloride flush (NS) 0.9 % injection 3 mL  3 mL Intravenous Q12H Smith, Rondell A, MD   3 mL at 02/03/20 1025  .  thiamine tablet 100 mg  100 mg Oral Daily Madelyn Flavors A, MD   100 mg at 02/03/20 1024     Discharge Medications: Please see discharge summary for a list of discharge medications.  Relevant Imaging Results:  Relevant Lab Results:   Additional Information SS# [id number]  Carmina Miller, LCSWA

## 2020-02-03 NOTE — Progress Notes (Addendum)
RN called report to SNF.  RN spoke to [employer]. Vee states that the patient did not fall at the facility.  RN informed Vee that pt was admitted due to a fall.  Injuries from fall were present on admission.

## 2020-03-10 DEATH — deceased
